# Patient Record
Sex: Male | Born: 1940 | Hispanic: No | Smoking: Former smoker
Health system: Southern US, Community
[De-identification: ages and names within clinical notes are randomized; demographics above are authoritative.]

## PROBLEM LIST (undated history)

## (undated) DIAGNOSIS — I2510 Atherosclerotic heart disease of native coronary artery without angina pectoris: Secondary | ICD-10-CM

## (undated) DIAGNOSIS — N289 Disorder of kidney and ureter, unspecified: Secondary | ICD-10-CM

## (undated) DIAGNOSIS — E119 Type 2 diabetes mellitus without complications: Secondary | ICD-10-CM

## (undated) DIAGNOSIS — I255 Ischemic cardiomyopathy: Secondary | ICD-10-CM

## (undated) DIAGNOSIS — I10 Essential (primary) hypertension: Secondary | ICD-10-CM

## (undated) DIAGNOSIS — Z77098 Contact with and (suspected) exposure to other hazardous, chiefly nonmedicinal, chemicals: Secondary | ICD-10-CM

## (undated) DIAGNOSIS — Z95810 Presence of automatic (implantable) cardiac defibrillator: Secondary | ICD-10-CM

## (undated) DIAGNOSIS — I4729 Other ventricular tachycardia: Secondary | ICD-10-CM

## (undated) DIAGNOSIS — J986 Disorders of diaphragm: Secondary | ICD-10-CM

## (undated) DIAGNOSIS — I219 Acute myocardial infarction, unspecified: Secondary | ICD-10-CM

## (undated) HISTORY — PX: KIDNEY STONE SURGERY: SHX686

## (undated) HISTORY — PX: CORONARY ARTERY BYPASS GRAFT: SHX141

## (undated) HISTORY — PX: BIOPSY PROSTATE: PRO28

---

## 2014-12-22 ENCOUNTER — Emergency Department: Payer: Non-veteran care

## 2014-12-22 ENCOUNTER — Emergency Department
Admission: EM | Admit: 2014-12-22 | Discharge: 2014-12-23 | Disposition: A | Discharge status: Other Acute Inpatient Hospital | Payer: Non-veteran care | Attending: Emergency Medicine | Admitting: Emergency Medicine

## 2014-12-22 ENCOUNTER — Encounter: Payer: Self-pay | Admitting: Urgent Care

## 2014-12-22 DIAGNOSIS — E871 Hypo-osmolality and hyponatremia: Secondary | ICD-10-CM | POA: Diagnosis not present

## 2014-12-22 DIAGNOSIS — N179 Acute kidney failure, unspecified: Secondary | ICD-10-CM | POA: Diagnosis not present

## 2014-12-22 DIAGNOSIS — I252 Old myocardial infarction: Secondary | ICD-10-CM | POA: Diagnosis not present

## 2014-12-22 DIAGNOSIS — I10 Essential (primary) hypertension: Secondary | ICD-10-CM | POA: Insufficient documentation

## 2014-12-22 DIAGNOSIS — R102 Pelvic and perineal pain: Secondary | ICD-10-CM | POA: Diagnosis not present

## 2014-12-22 DIAGNOSIS — A419 Sepsis, unspecified organism: Secondary | ICD-10-CM | POA: Insufficient documentation

## 2014-12-22 DIAGNOSIS — N390 Urinary tract infection, site not specified: Secondary | ICD-10-CM | POA: Insufficient documentation

## 2014-12-22 DIAGNOSIS — Z794 Long term (current) use of insulin: Secondary | ICD-10-CM | POA: Insufficient documentation

## 2014-12-22 DIAGNOSIS — M549 Dorsalgia, unspecified: Secondary | ICD-10-CM | POA: Diagnosis present

## 2014-12-22 DIAGNOSIS — E119 Type 2 diabetes mellitus without complications: Secondary | ICD-10-CM | POA: Diagnosis not present

## 2014-12-22 DIAGNOSIS — Z87891 Personal history of nicotine dependence: Secondary | ICD-10-CM | POA: Insufficient documentation

## 2014-12-22 HISTORY — DX: Acute myocardial infarction, unspecified: I21.9

## 2014-12-22 HISTORY — DX: Type 2 diabetes mellitus without complications: E11.9

## 2014-12-22 HISTORY — DX: Essential (primary) hypertension: I10

## 2014-12-22 LAB — URINALYSIS COMPLETE WITH MICROSCOPIC (ARMC ONLY)
Bilirubin Urine: NEGATIVE
Ketones, ur: NEGATIVE mg/dL
Nitrite: NEGATIVE
PROTEIN: 100 mg/dL — AB
SPECIFIC GRAVITY, URINE: 1.009 (ref 1.005–1.030)
SQUAMOUS EPITHELIAL / LPF: NONE SEEN
pH: 5 (ref 5.0–8.0)

## 2014-12-22 LAB — PROTIME-INR
INR: 1.2
PROTHROMBIN TIME: 15.4 s — AB (ref 11.4–15.0)

## 2014-12-22 LAB — BLOOD GAS, ARTERIAL
ALLENS TEST (PASS/FAIL): POSITIVE — AB
Acid-base deficit: 4.4 mmol/L — ABNORMAL HIGH (ref 0.0–2.0)
BICARBONATE: 20.2 meq/L — AB (ref 21.0–28.0)
FIO2: 0.21 %
O2 Saturation: 97.4 %
Patient temperature: 37
pCO2 arterial: 35 mmHg (ref 32.0–48.0)
pH, Arterial: 7.37 (ref 7.350–7.450)
pO2, Arterial: 98 mmHg (ref 83.0–108.0)

## 2014-12-22 LAB — CBC WITH DIFFERENTIAL/PLATELET
BASOS ABS: 0.1 10*3/uL (ref 0–0.1)
Basophils Relative: 1 %
Eosinophils Absolute: 0 10*3/uL (ref 0–0.7)
Eosinophils Relative: 0 %
HCT: 38.9 % — ABNORMAL LOW (ref 40.0–52.0)
HEMOGLOBIN: 12.9 g/dL — AB (ref 13.0–18.0)
Lymphocytes Relative: 13 %
Lymphs Abs: 1.9 10*3/uL (ref 1.0–3.6)
MCH: 28.6 pg (ref 26.0–34.0)
MCHC: 33.3 g/dL (ref 32.0–36.0)
MCV: 86 fL (ref 80.0–100.0)
Monocytes Absolute: 1.6 10*3/uL — ABNORMAL HIGH (ref 0.2–1.0)
Monocytes Relative: 11 %
NEUTROS ABS: 11.3 10*3/uL — AB (ref 1.4–6.5)
Neutrophils Relative %: 75 %
PLATELETS: 699 10*3/uL — AB (ref 150–440)
RBC: 4.52 MIL/uL (ref 4.40–5.90)
RDW: 14.4 % (ref 11.5–14.5)
WBC: 14.9 10*3/uL — AB (ref 3.8–10.6)

## 2014-12-22 LAB — COMPREHENSIVE METABOLIC PANEL
ALT: 16 U/L — ABNORMAL LOW (ref 17–63)
ANION GAP: 14 (ref 5–15)
AST: 19 U/L (ref 15–41)
Albumin: 3.2 g/dL — ABNORMAL LOW (ref 3.5–5.0)
Alkaline Phosphatase: 121 U/L (ref 38–126)
BILIRUBIN TOTAL: 0.8 mg/dL (ref 0.3–1.2)
BUN: 81 mg/dL — ABNORMAL HIGH (ref 6–20)
CALCIUM: 9.9 mg/dL (ref 8.9–10.3)
CO2: 22 mmol/L (ref 22–32)
Chloride: 93 mmol/L — ABNORMAL LOW (ref 101–111)
Creatinine, Ser: 2.24 mg/dL — ABNORMAL HIGH (ref 0.61–1.24)
GFR calc Af Amer: 32 mL/min — ABNORMAL LOW (ref >60)
GFR calc non Af Amer: 28 mL/min — ABNORMAL LOW (ref >60)
Glucose, Bld: 307 mg/dL — ABNORMAL HIGH (ref 65–99)
Potassium: 4.5 mmol/L (ref 3.5–5.1)
Sodium: 129 mmol/L — ABNORMAL LOW (ref 135–145)
TOTAL PROTEIN: 9 g/dL — AB (ref 6.5–8.1)

## 2014-12-22 LAB — LACTIC ACID, PLASMA: LACTIC ACID, VENOUS: 2.8 mmol/L — AB (ref 0.5–2.0)

## 2014-12-22 IMAGING — CT CT ABD-PELV W/O CM
1 of 2 series · 14 of 32 positions shown, 18 images · non-contrast
Comparison: None.

CLINICAL DATA: Subacute onset of lower back pain for 2 weeks.
Penile swelling and dysuria. Leukocytosis. Initial encounter.

EXAM:
CT ABDOMEN AND PELVIS WITHOUT CONTRAST
TECHNIQUE: Multidetector CT imaging of the abdomen and pelvis was performed
following the standard protocol without IV contrast.

[Series 2: routine abd pel without · axial · non-contrast · 0.71mm/px · z∈[-482,+23]mm · 14 of 115 slices shown, 18 images]
[im 9/115  soft-tissue]
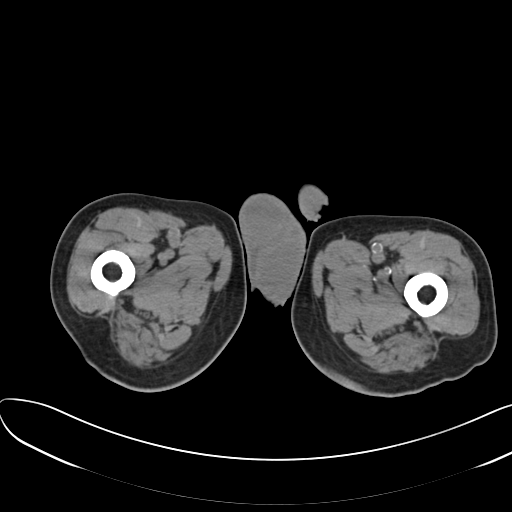
[im 9/115  bone]
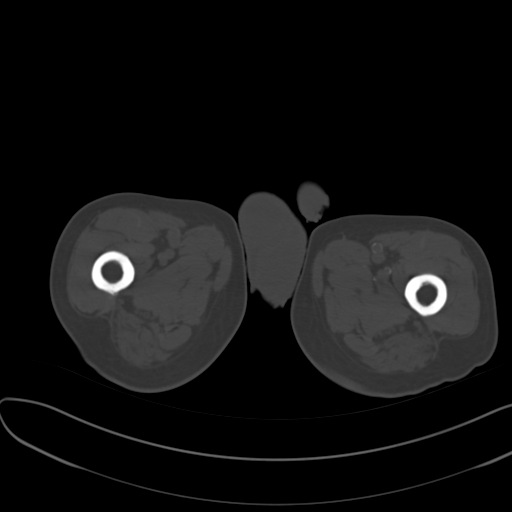
[im 18/115  soft-tissue]
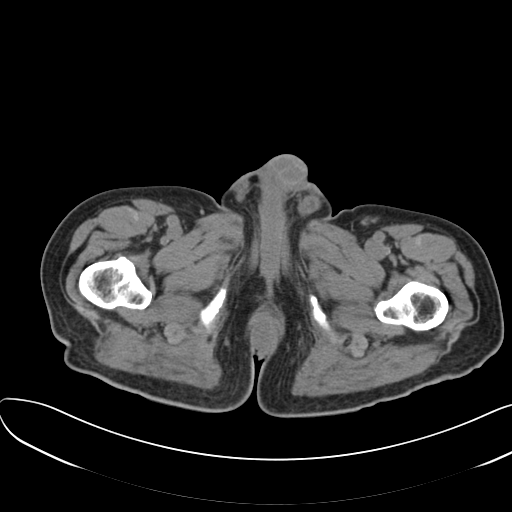
[im 27/115  soft-tissue]
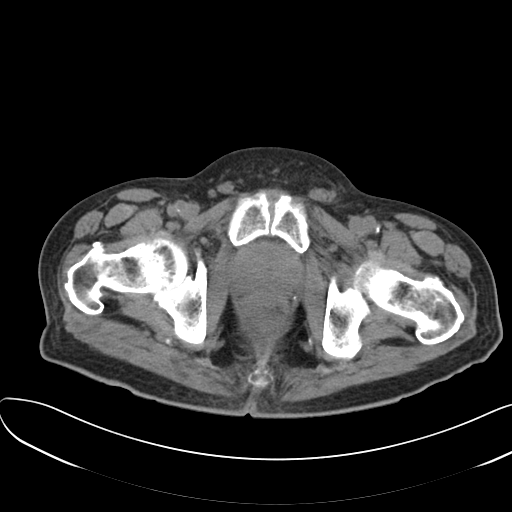
[im 36/115  soft-tissue]
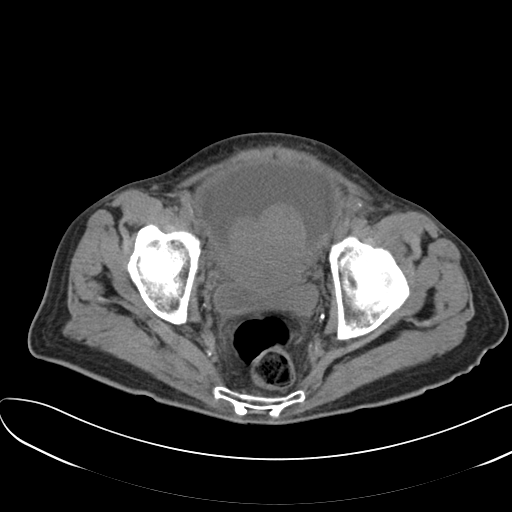
[im 44/115  soft-tissue]
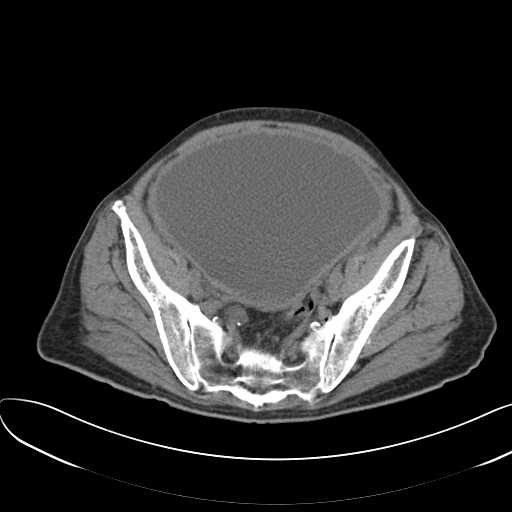
[im 53/115  soft-tissue]
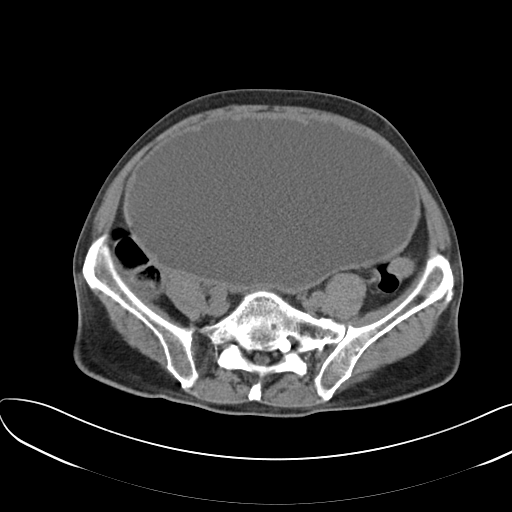
[im 62/115  soft-tissue]
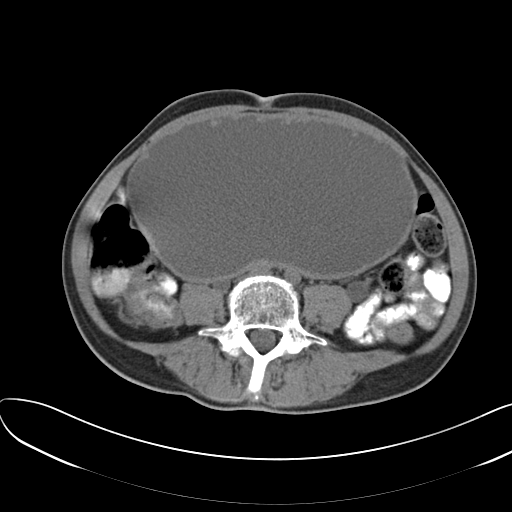
[im 71/115  soft-tissue]
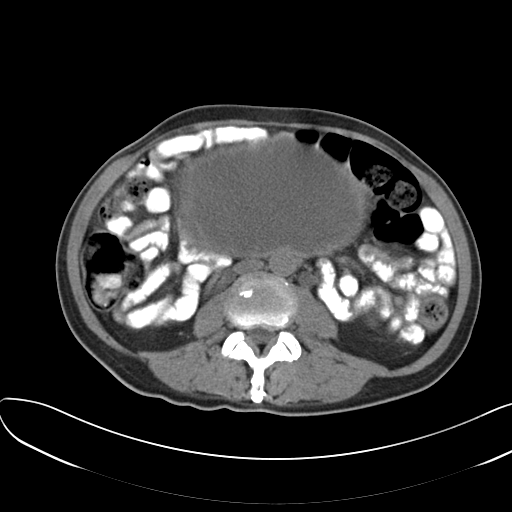
[im 79/115  soft-tissue]
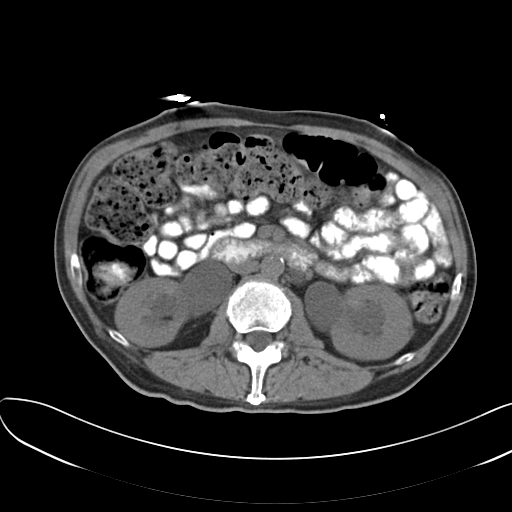
[im 79/115  bone]
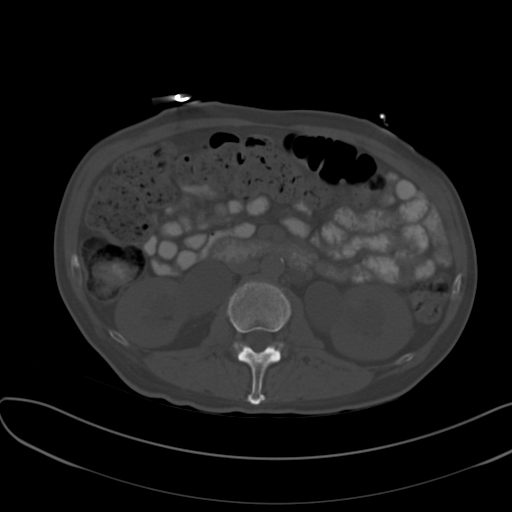
[im 88/115  soft-tissue]
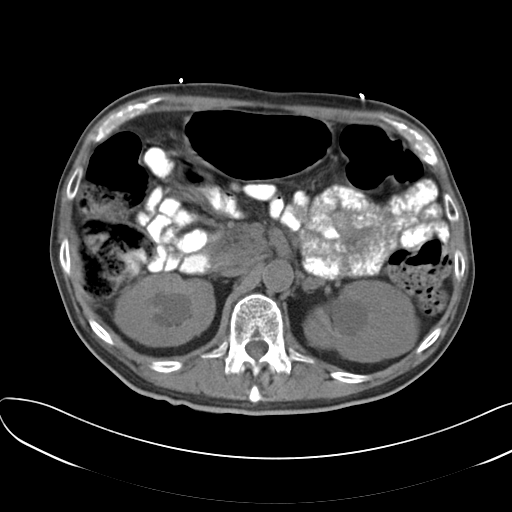
[im 97/115  soft-tissue]
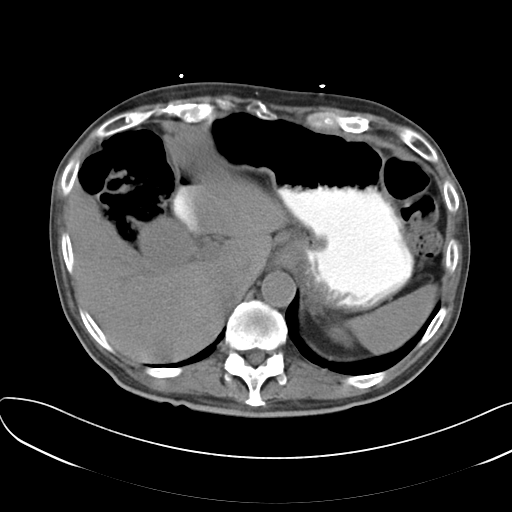
[im 97/115  lung]
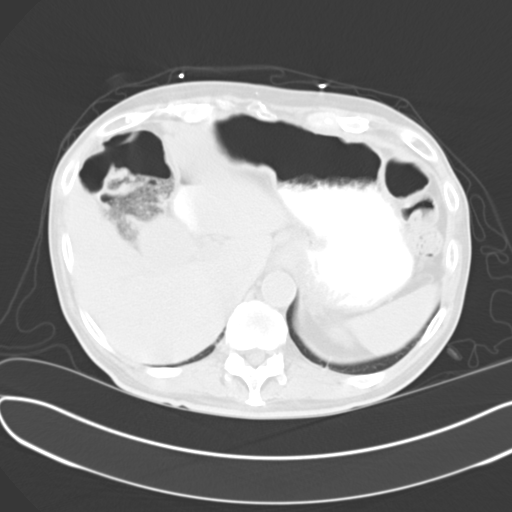
[im 101/115  lung]
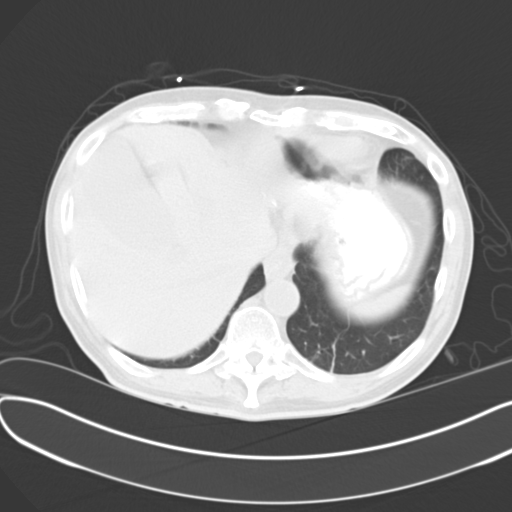
[im 106/115  soft-tissue]
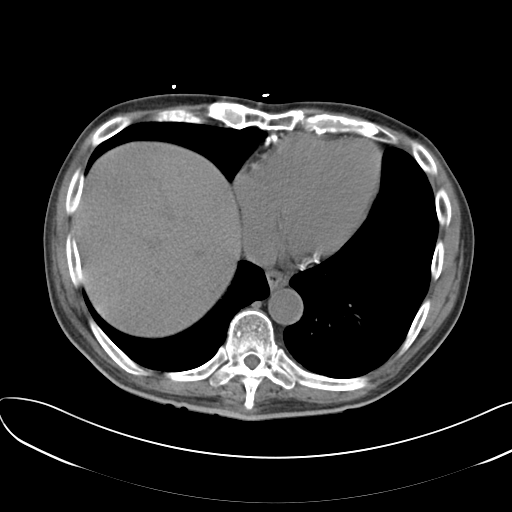
[im 106/115  lung]
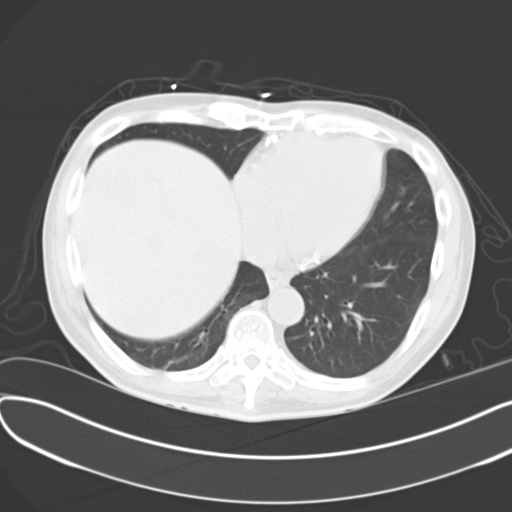
[im 110/115  lung]
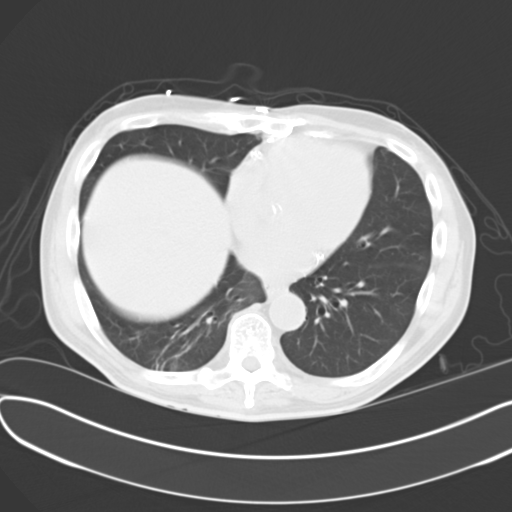

[14 of 32 positions shown; findings below may reference images not displayed]

FINDINGS: Mild bibasilar linear atelectasis or scarring is noted. Diffuse
coronary artery calcifications are seen. The patient is status post
median sternotomy.

The liver and spleen are unremarkable in appearance. The gallbladder
is distended but otherwise unremarkable. The common bile duct is not
well assessed but likely grossly normal in caliber. The pancreas and
adrenal glands are unremarkable.

Severe bilateral chronic hydronephrosis is noted. A 7 mm
nonobstructing stone is noted at the lower pole of the left kidney,
and scattered small nonobstructing bilateral renal stones are seen.
The kidneys are otherwise grossly unremarkable. No perinephric
stranding is appreciated.

No free fluid is identified. The small bowel is unremarkable in
appearance. The stomach is within normal limits. No acute vascular
abnormalities are seen. Mild scattered calcification is noted along
the abdominal aorta and its branches.

The appendix is grossly unremarkable in appearance, without evidence
of appendicitis. The colon is partially filled with stool, and is
unremarkable in appearance.

The bladder is markedly distended, measuring 22.3 cm in length.
There is underlying mild wall thickening, reflecting chronic
neurogenic bladder. There is abnormal enlargement of the prostate,
measuring approximately 10.7 cm in craniocaudal dimension, with
nodular extension of a 6.3 cm mass into the base of the bladder.
Underlying malignancy is a concern. No inguinal lymphadenopathy is
seen.

An apparent scrotal pearl is noted at the left side of the scrotum.

No acute osseous abnormalities are identified.
IMPRESSION: 1. Marked distention of the bladder, measuring 22.3 cm in length.
Underlying mild wall thickening reflects chronic neurogenic bladder.
2. Abnormal enlargement of the prostate, measuring 10.7 cm in
craniocaudal dimension, with nodular extension of a 6.3 cm mass into
the base of the bladder. Underlying malignancy is a concern. This is
likely causing some degree of bladder outlet obstruction.
3. Severe chronic bilateral hydronephrosis noted. No obstructing
ureteral stone seen. Multiple nonobstructing bilateral renal stones
noted, measuring up to 7 mm in size.
4. Diffuse coronary artery calcifications seen.
5. Mild bibasilar linear atelectasis or scarring noted.
6. Mild scattered calcification along the abdominal aorta and its
branches.
7. Apparent scrotal pearl at the left side of the scrotum.

These results were called by telephone at the time of interpretation
on [DATE] at [DATE] to Dr. PHILOGENE, who verbally
acknowledged these results.

## 2014-12-22 MED ORDER — CEFTRIAXONE SODIUM IN DEXTROSE 40 MG/ML IV SOLN
2.0000 g | INTRAVENOUS | Status: AC
  Administered 2014-12-22: 2 g via INTRAVENOUS
  Filled 2014-12-22: qty 50

## 2014-12-22 MED ORDER — IOHEXOL 240 MG/ML SOLN
25.0000 mL | INTRAMUSCULAR | Status: AC
  Administered 2014-12-22 (×2): 25 mL via ORAL

## 2014-12-22 MED ORDER — SODIUM CHLORIDE 0.9 % IV BOLUS (SEPSIS)
800.0000 mL | Freq: Once | INTRAVENOUS | Status: AC
  Administered 2014-12-22: 800 mL via INTRAVENOUS

## 2014-12-22 MED ORDER — SODIUM CHLORIDE 0.9 % IV SOLN
Freq: Once | INTRAVENOUS | Status: AC
  Administered 2014-12-22: 22:00:00 via INTRAVENOUS

## 2014-12-22 MED ORDER — SODIUM CHLORIDE 0.9 % IV BOLUS (SEPSIS)
1000.0000 mL | INTRAVENOUS | Status: AC
  Administered 2014-12-22: 1000 mL via INTRAVENOUS

## 2014-12-22 NOTE — ED Provider Notes (Signed)
[facility] Emergency Department Provider Note  ____________________________________________  Time seen: Approximately 8:34 PM  I have reviewed the triage vital signs and the nursing notes.   HISTORY  Chief Complaint Back Pain; Hypotension; Dysuria; and Groin Swelling  History is limited as the patient is a vague historian when it comes to describing his symptoms.   HPI Glenn [name] is a 74 y.o. male with a history of insulin-dependent diabetes, hypertension, and CAD/MI status post CABG who presents with 2 weeks of slowly worsening lower back and penile pain.He states that the onset has been gradual but persistent for 2 weeks, and got much worse today.  He states that he has swelling in the penis and foreskin and that today "white stuff" is coming out of it.  He states that it hurts when he urinates.  It is also tender in his scrotum and "all the way to the anus ".  He denies tenesmus.  He denies chest pain, shortness of breath, abdominal pain, and vomiting, though he does have persistent nausea.  He has also complaining of lower back pain, generalized, moderate, for the last 2 weeks.   Past Medical History  Diagnosis Date  . Diabetes mellitus without complication   . Hypertension   . MI (myocardial infarction)     There are no active problems to display for this patient.   Past Surgical History  Procedure Laterality Date  . Coronary artery bypass graft      x 4    Current Outpatient Rx  Name  Route  Sig  Dispense  Refill  . insulin NPH-regular Human (NOVOLIN 70/30) (70-30) 100 UNIT/ML injection   Subcutaneous   Inject 25 Units into the skin daily.           Allergies Review of patient's allergies indicates no known allergies.  History reviewed. No pertinent family history.  Social History History  Substance Use Topics  . Smoking status: Former Games developer  . Smokeless tobacco: Not on file  . Alcohol Use: Yes    Review of  Systems Constitutional: No fever/chills Eyes: No visual changes. ENT: No sore throat. Cardiovascular: Denies chest pain. Respiratory: Denies shortness of breath. Gastrointestinal: No abdominal pain.  nausea, no vomiting.  No diarrhea.  No constipation. Genitourinary: Dysuria and white discharge with swelling to the penis and perineum Musculoskeletal: Negative for back pain. Skin: Negative for rash. Neurological: Negative for headaches, focal weakness or numbness.  10-point ROS otherwise negative.  ____________________________________________   PHYSICAL EXAM:  VITAL SIGNS: ED Triage Vitals  Enc Vitals Group     BP 12/22/14 2022 86/53 mmHg     Pulse Rate 12/22/14 2022 68     Resp 12/22/14 2022 14     Temp 12/22/14 2022 97.4 F (36.3 C)     Temp Source 12/22/14 2022 Oral     SpO2 12/22/14 2022 100 %     Weight 12/22/14 2022 129 lb (58.514 kg)     Height 12/22/14 2022 6' (1.829 m)     Head Cir --      Peak Flow --      Pain Score 12/22/14 2024 7     Pain Loc --      Pain Edu? --      Excl. in GC? --     Constitutional: Alert and oriented.  Has the appearance of chronic illness.  Nontoxic appearance.  Cachectic. Eyes: Conjunctivae are normal. PERRL. EOMI. Head: Atraumatic. Nose: No congestion/rhinnorhea. Mouth/Throat: Mucous membranes are moist.  Oropharynx non-erythematous. Neck: No stridor.   Cardiovascular: Normal rate, regular rhythm. Grossly normal heart sounds.  Good peripheral circulation.  Old sternotomy scar Respiratory: Normal respiratory effort.  No retractions. Lungs CTAB. Gastrointestinal: Soft and nontender. No distention. No abdominal bruits. No CVA tenderness. Genitourinary: Uncircumcised male with mild erythema to the glans of his penis when the foreskin is retracted, but no obvious infection; most consistent with balanitis rather than balanoposthitis.  Tenderness to palpation of the scrotum and perineum but without crepitus, breaks in the skin, induration,  or fluctuance. Musculoskeletal: No lower extremity tenderness nor edema.  No joint effusions. Neurologic:  Normal speech and language. No gross focal neurologic deficits are appreciated.  Skin:  Skin is warm, dry and intact. No rash noted. Psychiatric: Mood and affect are normal. Speech and behavior are normal.  ____________________________________________   LABS (all labs ordered are listed, but only abnormal results are displayed)  Labs Reviewed  LACTIC ACID, PLASMA - Abnormal; Notable for the following:    Lactic Acid, Venous 2.8 (*)    All other components within normal limits  COMPREHENSIVE METABOLIC PANEL - Abnormal; Notable for the following:    Sodium 129 (*)    Chloride 93 (*)    Glucose, Bld 307 (*)    BUN 81 (*)    Creatinine, Ser 2.24 (*)    Total Protein 9.0 (*)    Albumin 3.2 (*)    ALT 16 (*)    GFR calc non Af Amer 28 (*)    GFR calc Af Amer 32 (*)    All other components within normal limits  CBC WITH DIFFERENTIAL/PLATELET - Abnormal; Notable for the following:    WBC 14.9 (*)    Hemoglobin 12.9 (*)    HCT 38.9 (*)    Platelets 699 (*)    Neutro Abs 11.3 (*)    Monocytes Absolute 1.6 (*)    All other components within normal limits  PROTIME-INR - Abnormal; Notable for the following:    Prothrombin Time 15.4 (*)    All other components within normal limits  URINALYSIS COMPLETEWITH MICROSCOPIC ([facility] ONLY) - Abnormal; Notable for the following:    Color, Urine YELLOW (*)    APPearance TURBID (*)    Glucose, UA >500 (*)    Hgb urine dipstick 1+ (*)    Protein, ur 100 (*)    Leukocytes, UA 3+ (*)    Bacteria, UA MANY (*)    All other components within normal limits  BLOOD GAS, ARTERIAL - Abnormal; Notable for the following:    Bicarbonate 20.2 (*)    Acid-base deficit 4.4 (*)    Allens test (pass/fail) POSITIVE (*)    All other components within normal limits  CULTURE, BLOOD (ROUTINE X 2)  CULTURE, BLOOD (ROUTINE X 2)  URINE CULTURE  LACTIC ACID,  PLASMA   ____________________________________________  EKG  Not indicated  ____________________________________________  RADIOLOGY  CT abd/pelvis with PO contrast only is pending (as of 22:15)  ____________________________________________   PROCEDURES  Procedure(s) performed: None  Critical Care performed: Yes  CRITICAL CARE Performed by: Loleta Rose   Total critical care time: 45 minutes  Critical care time was exclusive of separately billable procedures and treating other patients.  Critical care was necessary to treat or prevent imminent or life-threatening deterioration.  Critical care was time spent personally by me on the following activities: development of treatment plan with patient and/or surrogate as well as nursing, discussions with consultants, evaluation of patient's response to treatment,  examination of patient, obtaining history from patient or surrogate, ordering and performing treatments and interventions, ordering and review of laboratory studies, ordering and review of radiographic studies, pulse oximetry and re-evaluation of patient's condition.  ____________________________________________   INITIAL IMPRESSION / ASSESSMENT AND PLAN / ED COURSE  Pertinent labs & imaging results that were available during my care of the patient were reviewed by me and considered in my medical decision making (see chart for details).  The patient was hypotensive in triage but after getting to an exam room his blood pressure improved to 117/77.  He does not have tachycardia, is afebrile, normal respiratory rate, and no altered mental status.  Currently he does not meet sepsis criteria though he has the appearance of acute on chronic illness.  I am most concerned with ruling out for Fournier's gangrene, though if it is, it is early as he has no crepitus or fluctuance.  His two weeks of symptoms suggest a more insidious course as well.  I expressed to him how important  it is that we get a urine specimen as soon as possible and that we will perform an I have catheterization if needed to obtain the urine.  We will proceed with IV fluids and lab work including a lactate.  I will have a low threshold for empiric antibiotics, but he has no complaints other than dysuria and the perineal tenderness, so I will hold until we get a urine sample.  ----------------------------------------- 8:58 PM on 12/22/2014 -----------------------------------------  The patient's urine sample is extremely purulent.  I will proceed with empiric antibiotics for presumed urinary tract infection.  A urine culture has been ordered.  ----------------------------------------- 10:09 PM on 12/22/2014 -----------------------------------------  BP 160/89 mmHg  Pulse 79  Temp(Src) 97.7 F (36.5 C) (Oral)  Resp 13  Ht 6' (1.829 m)  Wt 129 lb (58.514 kg)  BMI 17.49 kg/m2  SpO2 100%  The patient's blood pressure is improved, but his lactate is 2.8 and he has acute renal failure with a BUN of 81 and a creatinine of 2.24.  We do not have a baseline creatinine in the system but a BUN of 81 and is certainly concerning.  He has a leukocytosis of approximately 15.  He meets sepsis criteria.  I am giving him 1.8 L of normal saline (just over 30 mL/kg in order to meet sepsis recommendations) and continuing the ceftriaxone 2 g IV for urosepsis.  I am proceeding with the CT abdomen and pelvis given his complaints of lower back pain and perineal pain, but we must proceed without IV contrast given his GFR.  I have filled out paperwork for a [facility] transfer with everything except for the CT scan results which are of course pending.  Either way, the patient will need admission for sepsis in the setting of a urinary tract infection and I am beginning the process now.  I am transferring care in the emergency department to Dr. Derrill KayGoodman.  ____________________________________________  FINAL CLINICAL IMPRESSION(S) /  ED DIAGNOSES  Final diagnoses:  Sepsis, due to unspecified organism  Complicated UTI (urinary tract infection)  Acute renal failure, unspecified acute renal failure type  Perineal pain in male  Hyponatremia      NEW MEDICATIONS STARTED DURING THIS VISIT:  New Prescriptions   No medications on file       Loleta Roseory Apryle Stowell, MD 12/23/14 0021

## 2014-12-22 NOTE — ED Notes (Signed)
MD at bedside. 

## 2014-12-22 NOTE — ED Notes (Signed)
Pt called out for nurse,  He reports that his face is feeling hot.  He says that he feels that his face started feeling hot when the second liter of IV fluid was started.   He denies other complaints.  He is afebrile.  While at bedside  This RN decreased rate of infusion and after just a minute he reports that he is feeling better and his face is no longer hot like it was. Also checked infusion site and there is no redness, tenderness or edema noted at site.

## 2014-12-22 NOTE — ED Notes (Signed)
Patient transported to CT 

## 2014-12-22 NOTE — ED Notes (Signed)
Patient presents with c/o lower back pain x 2 weeks. (+) penile swelling reported. (+) dysuria - patient reporting that he is "peeing thick white stuff."

## 2014-12-23 LAB — LACTIC ACID, PLASMA: Lactic Acid, Venous: 1.4 mmol/L (ref 0.5–2.0)

## 2014-12-23 NOTE — ED Notes (Signed)
MD at bedside. 

## 2014-12-25 LAB — URINE CULTURE: SPECIAL REQUESTS: NORMAL

## 2014-12-27 LAB — CULTURE, BLOOD (ROUTINE X 2)
CULTURE: NO GROWTH
Culture: NO GROWTH

## 2016-09-04 ENCOUNTER — Emergency Department: Payer: Non-veteran care

## 2016-09-04 ENCOUNTER — Emergency Department
Admission: EM | Admit: 2016-09-04 | Discharge: 2016-09-04 | Disposition: A | Discharge status: Home / Self Care | Payer: Non-veteran care | Attending: Emergency Medicine | Admitting: Emergency Medicine

## 2016-09-04 ENCOUNTER — Encounter: Payer: Self-pay | Admitting: Emergency Medicine

## 2016-09-04 DIAGNOSIS — R600 Localized edema: Secondary | ICD-10-CM | POA: Diagnosis not present

## 2016-09-04 DIAGNOSIS — N390 Urinary tract infection, site not specified: Secondary | ICD-10-CM | POA: Diagnosis not present

## 2016-09-04 DIAGNOSIS — R609 Edema, unspecified: Secondary | ICD-10-CM

## 2016-09-04 DIAGNOSIS — Z951 Presence of aortocoronary bypass graft: Secondary | ICD-10-CM | POA: Diagnosis not present

## 2016-09-04 DIAGNOSIS — E119 Type 2 diabetes mellitus without complications: Secondary | ICD-10-CM | POA: Insufficient documentation

## 2016-09-04 DIAGNOSIS — Z87891 Personal history of nicotine dependence: Secondary | ICD-10-CM | POA: Insufficient documentation

## 2016-09-04 DIAGNOSIS — I10 Essential (primary) hypertension: Secondary | ICD-10-CM | POA: Diagnosis not present

## 2016-09-04 DIAGNOSIS — Z794 Long term (current) use of insulin: Secondary | ICD-10-CM | POA: Insufficient documentation

## 2016-09-04 DIAGNOSIS — R0602 Shortness of breath: Secondary | ICD-10-CM | POA: Diagnosis not present

## 2016-09-04 DIAGNOSIS — R369 Urethral discharge, unspecified: Secondary | ICD-10-CM | POA: Diagnosis present

## 2016-09-04 DIAGNOSIS — R05 Cough: Secondary | ICD-10-CM | POA: Diagnosis not present

## 2016-09-04 HISTORY — DX: Disorder of kidney and ureter, unspecified: N28.9

## 2016-09-04 HISTORY — DX: Contact with and (suspected) exposure to other hazardous, chiefly nonmedicinal, chemicals: Z77.098

## 2016-09-04 LAB — URINALYSIS, COMPLETE (UACMP) WITH MICROSCOPIC
BACTERIA UA: NONE SEEN
BILIRUBIN URINE: NEGATIVE
KETONES UR: 5 mg/dL — AB
Nitrite: NEGATIVE
Protein, ur: 100 mg/dL — AB
Specific Gravity, Urine: 1.018 (ref 1.005–1.030)
pH: 6 (ref 5.0–8.0)

## 2016-09-04 LAB — BASIC METABOLIC PANEL
Anion gap: 9 (ref 5–15)
BUN: 19 mg/dL (ref 6–20)
CHLORIDE: 96 mmol/L — AB (ref 101–111)
CO2: 25 mmol/L (ref 22–32)
CREATININE: 1.17 mg/dL (ref 0.61–1.24)
Calcium: 8.9 mg/dL (ref 8.9–10.3)
GFR calc non Af Amer: >60 mL/min (ref >60)
Glucose, Bld: 265 mg/dL — ABNORMAL HIGH (ref 65–99)
Potassium: 4.1 mmol/L (ref 3.5–5.1)
Sodium: 130 mmol/L — ABNORMAL LOW (ref 135–145)

## 2016-09-04 LAB — CBC
HCT: 38.2 % — ABNORMAL LOW (ref 40.0–52.0)
Hemoglobin: 12.4 g/dL — ABNORMAL LOW (ref 13.0–18.0)
MCH: 25.5 pg — ABNORMAL LOW (ref 26.0–34.0)
MCHC: 32.5 g/dL (ref 32.0–36.0)
MCV: 78.4 fL — AB (ref 80.0–100.0)
Platelets: 210 10*3/uL (ref 150–440)
RBC: 4.87 MIL/uL (ref 4.40–5.90)
RDW: 22.2 % — AB (ref 11.5–14.5)
WBC: 7.3 10*3/uL (ref 3.8–10.6)

## 2016-09-04 LAB — TROPONIN I: Troponin I: <0.03 ng/mL (ref <0.03)

## 2016-09-04 LAB — CK: Total CK: 81 U/L (ref 49–397)

## 2016-09-04 LAB — BRAIN NATRIURETIC PEPTIDE: B Natriuretic Peptide: 2561 pg/mL — ABNORMAL HIGH (ref 0.0–100.0)

## 2016-09-04 IMAGING — CR DG CHEST 2V
2 series · 2 of 2 positions shown · non-contrast
Comparison: [DATE] CXR report

CLINICAL DATA: Dyspnea

EXAM:
CHEST  2 VIEW

[chest pa]
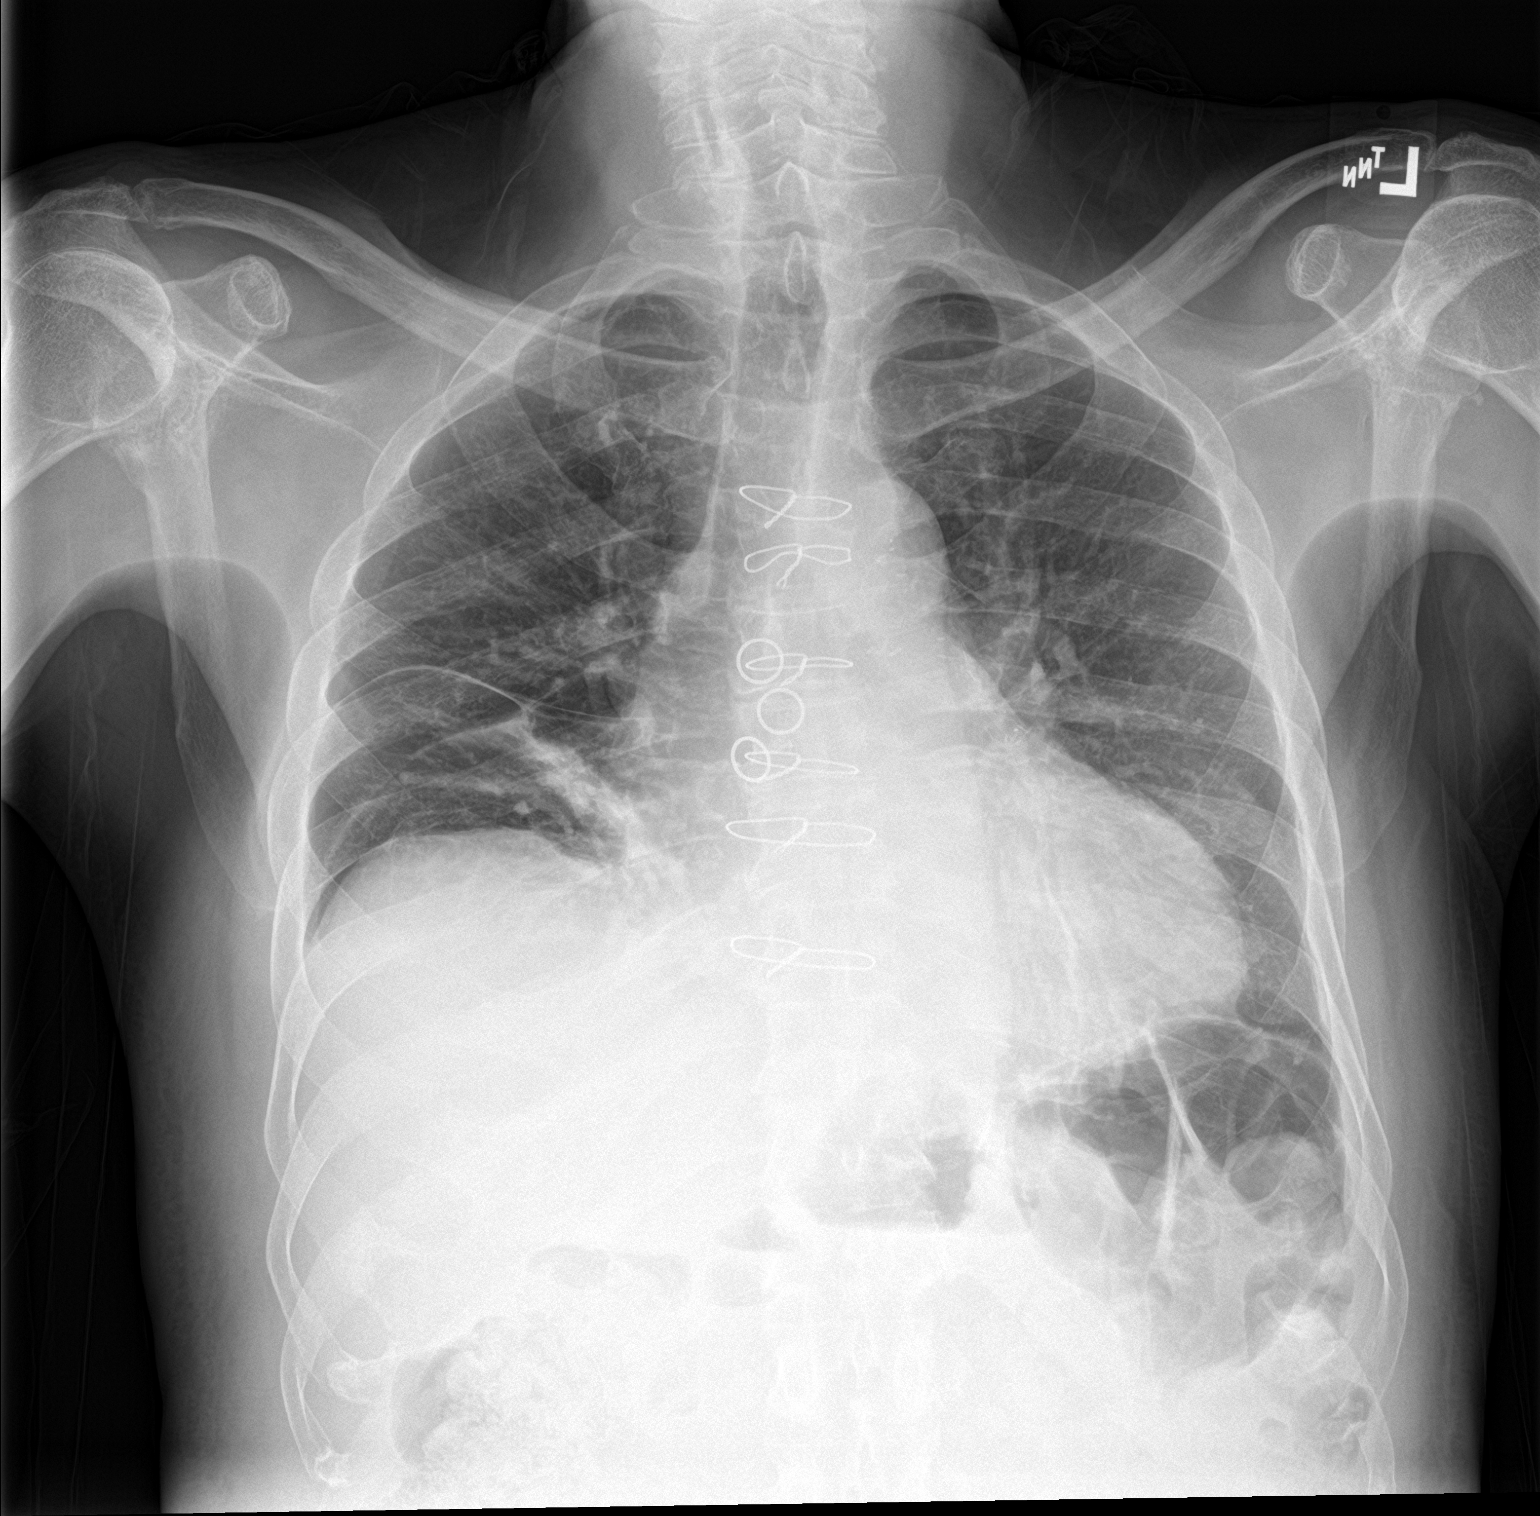

[chest lat]
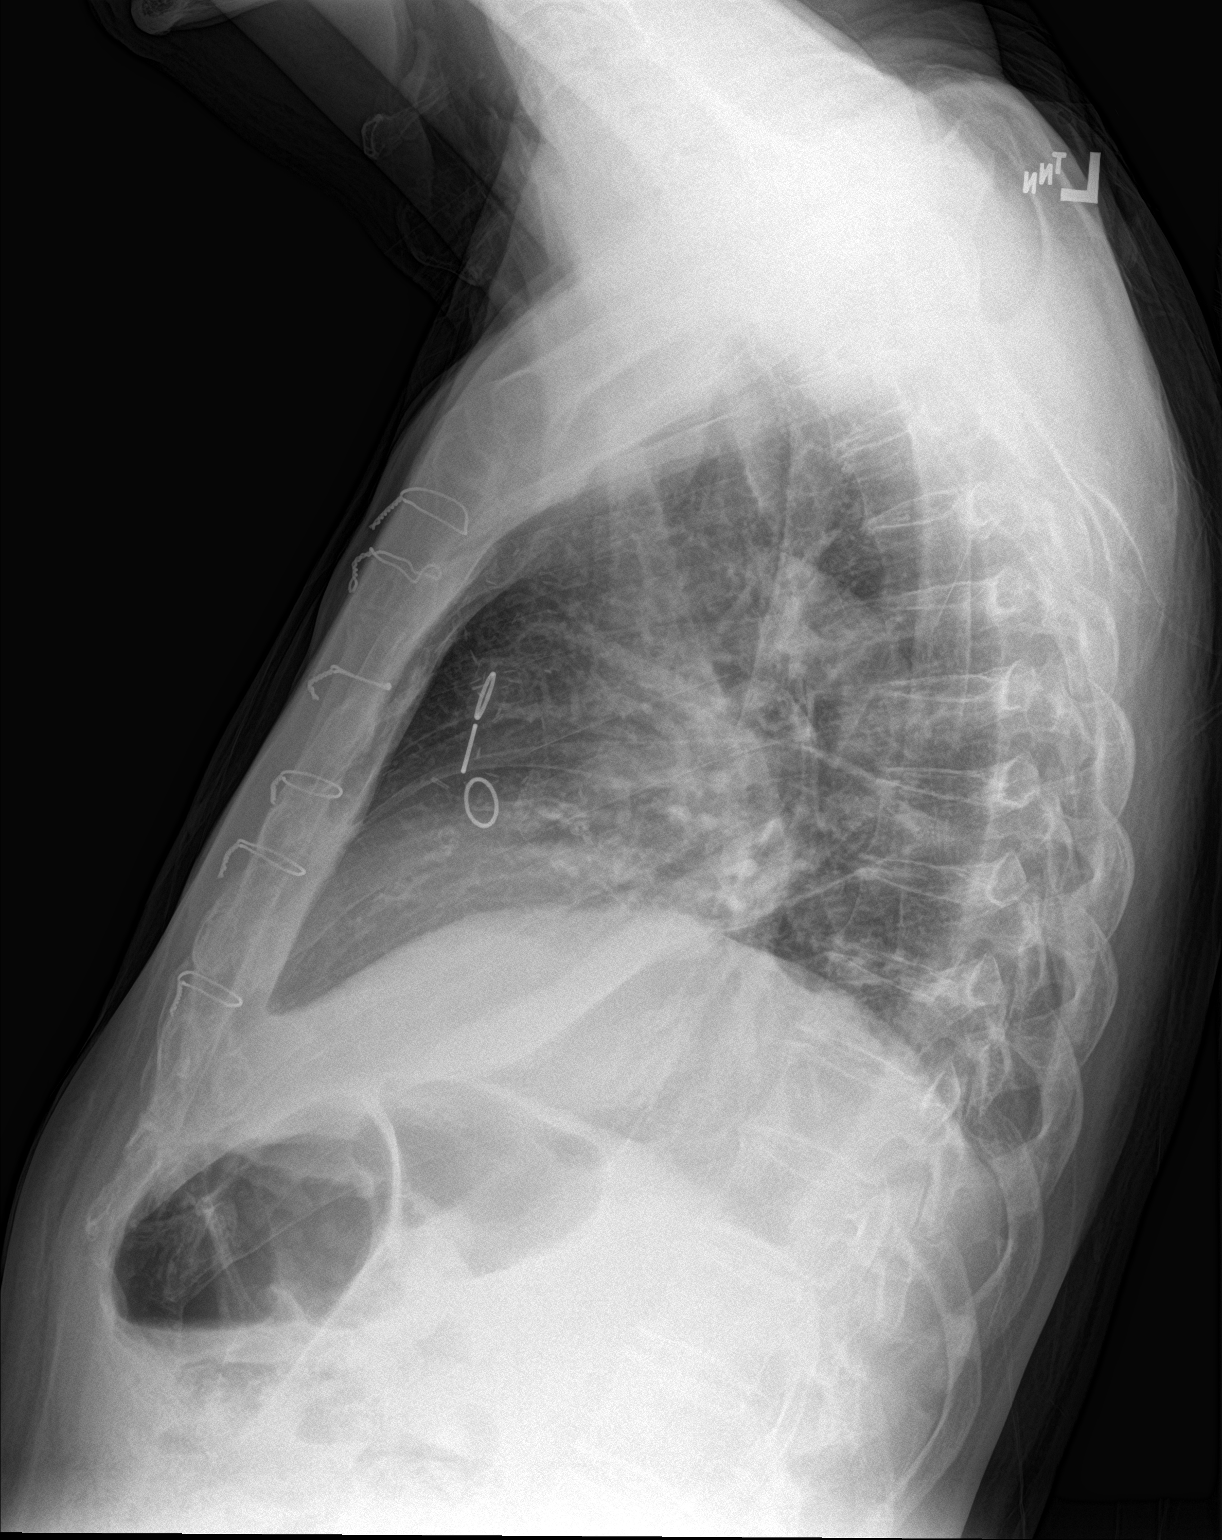

[2 of 2 positions shown; findings below may reference images not displayed]

FINDINGS: Scarring and/or atelectasis is noted at the lung bases. Patient is
status post CABG. Cardiac silhouette is mildly enlarged. There is
slight elevation of the right hemidiaphragm. No pulmonary
consolidation, effusion or pneumothorax.
IMPRESSION: Bibasilar scarring and/or atelectasis. Cardiomegaly with post CABG
change. Slight elevation of the right hemidiaphragm.

## 2016-09-04 MED ORDER — FUROSEMIDE 20 MG PO TABS: 20.0000 mg | ORAL_TABLET | Freq: Every day | ORAL | 0 refills | Status: DC

## 2016-09-04 MED ORDER — CEPHALEXIN 500 MG PO CAPS
500.0000 mg | ORAL_CAPSULE | Freq: Once | ORAL | Status: AC
  Administered 2016-09-04: 500 mg via ORAL
  Filled 2016-09-04: qty 1

## 2016-09-04 MED ORDER — FUROSEMIDE 40 MG PO TABS
20.0000 mg | ORAL_TABLET | Freq: Once | ORAL | Status: AC
  Administered 2016-09-04: 20 mg via ORAL
  Filled 2016-09-04: qty 2

## 2016-09-04 MED ORDER — CEPHALEXIN 500 MG PO CAPS: 500.0000 mg | ORAL_CAPSULE | Freq: Two times a day (BID) | ORAL | 0 refills | Status: DC

## 2016-09-04 NOTE — ED Provider Notes (Signed)
[facility] Emergency Department Provider Note  Time seen: 9:07 PM  I have reviewed the triage vital signs and the nursing notes.   HISTORY  Chief Complaint Groin Swelling and Penile Discharge    HPI Glenn [name] is a 75 y.o. male with a past medical history of diabetes, hypertension, MI, presents to the emergency department for scrotal swelling. According to the patient for the past many months he has been having lower extremity swelling. He states for the past 1-2 weeks he has had significant swelling in his scrotum as well. He also states for the past several weeks he has been feeling more short of breath especially with any type of exertion. Denies any chest pain. Denies any leg pain. Denies abdominal pain or known swelling. Denies alcohol use. Denies fever. Patient does state occasional cough over the past several weeks without sputum production.  Past Medical History:  Diagnosis Date  . Diabetes mellitus without complication (HCC)   . Exposure to [employer]   . Hypertension   . MI (myocardial infarction)   . Renal disorder    kidney stones    There are no active problems to display for this patient.   Past Surgical History:  Procedure Laterality Date  . BIOPSY PROSTATE    . CORONARY ARTERY BYPASS GRAFT     x 4  . KIDNEY STONE SURGERY      Prior to Admission medications   Medication Sig Start Date End Date Taking? Authorizing Provider  insulin NPH-regular Human (NOVOLIN 70/30) (70-30) 100 UNIT/ML injection Inject 25 Units into the skin daily.    Historical Provider, MD    No Known Allergies  No family history on file.  Social History Social History  Substance Use Topics  . Smoking status: Former Games developer  . Smokeless tobacco: Never Used  . Alcohol use Yes     Comment: rarely    Review of Systems Constitutional: Negative for fever. Cardiovascular: Negative for chest pain. Respiratory: Mild shortness of breath worse with exertion.  Positive for cough. Gastrointestinal: Negative for abdominal pain Genitourinary: Negative for dysuria. Positive for scrotal swelling. Neurological: Negative for headache 10-point ROS otherwise negative.  ____________________________________________   PHYSICAL EXAM:  VITAL SIGNS: ED Triage Vitals  Enc Vitals Group     BP 09/04/16 1727 (!) 155/108     Pulse Rate 09/04/16 1727 80     Resp 09/04/16 1727 18     Temp 09/04/16 1727 97.9 F (36.6 C)     Temp Source 09/04/16 1727 Oral     SpO2 09/04/16 1727 98 %     Weight 09/04/16 1728 145 lb (65.8 kg)     Height 09/04/16 1728 6' (1.829 m)     Head Circumference --      Peak Flow --      Pain Score --      Pain Loc --      Pain Edu? --      Excl. in GC? --     Constitutional: Alert and oriented. Well appearing and in no distress. Eyes: Normal exam ENT   Head: Normocephalic and atraumatic.   Mouth/Throat: Mucous membranes are moist. Cardiovascular: Normal rate, regular rhythm. No murmur Respiratory: Normal respiratory effort without tachypnea nor retractions. Slight rales in bilateral lower lung fields. Gastrointestinal: Soft and nontender. No distention.   Genitourinary: Quite edematous scrotum mild tenderness. Edematous penis. Patient denies any difficulty urinating. Musculoskeletal: Nontender with normal range of motion in all extremities. 2+ lower extremity edema  bilaterally. Neurologic:  Normal speech and language. No gross focal neurologic deficits  Skin:  Skin is warm, dry and intact.  Psychiatric: Mood and affect are normal.  ____________________________________________    EKG  EKG reviewed and interpreted by myself shows normal sinus rhythm at 79 bpm, widened QRS, left axis deviation, left bundle branch block, no concerning ST changes.  ____________________________________________    RADIOLOGY  IMPRESSION: Bibasilar scarring and/or atelectasis. Cardiomegaly with post CABG change. Slight elevation of  the right hemidiaphragm.  ____________________________________________   INITIAL IMPRESSION / ASSESSMENT AND PLAN / ED COURSE  Pertinent labs & imaging results that were available during my care of the patient were reviewed by me and considered in my medical decision making (see chart for details).  Patient presents to the emergency department with scrotal edema. Patient states many months of lower extremity edema which has worsened. For the past several weeks he has now had increasing scrotal edema as well as mild shortness of breath with cough. On exam the patient does have scrotal and lower extremity edema most consistent with fluid accumulation such as CHF. His BNP is 2500 with a negative troponin. Patient states he has had echocardiograms performed in the past at the [place]. We'll attempt to obtain the patient's records to review these. Patient also states dark urine but denies dysuria. We will check urinalysis. Patient's troponin is negative. Patient denies any current shortness of breath but states he does become short of breath with exertion. Patient has mild rales in the lower lung fields bilaterally, x-rays saying bibasilar atelectasis.  Urinalysis appears show a urinary tract infection. We will start on Keflex twice daily for the next 7 days. Given the patient's scrotal and lower extremity edema we will start on Lasix 20 mg daily for the next 10 days. [facility]. Patient will call tomorrow for an appointment within the next 7 days. I discussed return precautions for any shortness of breath or chest pain. ____________________________________________   FINAL CLINICAL IMPRESSION(S) / ED DIAGNOSES  Peripheral edema    Minna Antis, MD 09/04/16 2314

## 2016-09-04 NOTE — ED Triage Notes (Signed)
Patient presents to the ED with scrotal swelling x 1 week and penile discharge described as "puss".  Patient has CHF and has some swelling to feet and ankles as well.  Patient reports he has been slightly more short of breath lately, patient recently recovered from the flu.

## 2016-09-04 NOTE — Discharge Instructions (Signed)
As we discussed please take your medications as prescribed. Please follow-up with your cardiologist by calling tomorrow to arrange a follow-up appointment as soon as possible for a repeat echocardiogram (ultrasound of the heart). Return to the emergency department for any shortness of breath or any chest pain.

## 2021-07-12 ENCOUNTER — Ambulatory Visit: Admit: 2021-07-12 | Payer: Non-veteran care | Admitting: Internal Medicine

## 2021-07-12 SURGERY — COLONOSCOPY WITH PROPOFOL
Anesthesia: General

## 2021-11-01 ENCOUNTER — Emergency Department: Payer: No Typology Code available for payment source

## 2021-11-01 ENCOUNTER — Other Ambulatory Visit: Payer: Self-pay

## 2021-11-01 ENCOUNTER — Encounter: Payer: Self-pay | Admitting: Emergency Medicine

## 2021-11-01 ENCOUNTER — Inpatient Hospital Stay (HOSPITAL_COMMUNITY)
Admission: AD | Admit: 2021-11-01 | Discharge: 2022-01-02 | DRG: 871 | Disposition: E | Payer: Medicare Other | Source: Other Acute Inpatient Hospital | Attending: Internal Medicine | Admitting: Internal Medicine

## 2021-11-01 ENCOUNTER — Emergency Department
Admission: EM | Admit: 2021-11-01 | Discharge: 2021-11-01 | Disposition: A | Discharge status: Other Acute Inpatient Hospital | Payer: No Typology Code available for payment source | Attending: Student in an Organized Health Care Education/Training Program | Admitting: Student in an Organized Health Care Education/Training Program

## 2021-11-01 DIAGNOSIS — I130 Hypertensive heart and chronic kidney disease with heart failure and stage 1 through stage 4 chronic kidney disease, or unspecified chronic kidney disease: Secondary | ICD-10-CM | POA: Diagnosis present

## 2021-11-01 DIAGNOSIS — Z79899 Other long term (current) drug therapy: Secondary | ICD-10-CM

## 2021-11-01 DIAGNOSIS — I5033 Acute on chronic diastolic (congestive) heart failure: Secondary | ICD-10-CM | POA: Diagnosis not present

## 2021-11-01 DIAGNOSIS — I2510 Atherosclerotic heart disease of native coronary artery without angina pectoris: Secondary | ICD-10-CM | POA: Diagnosis not present

## 2021-11-01 DIAGNOSIS — N189 Chronic kidney disease, unspecified: Secondary | ICD-10-CM

## 2021-11-01 DIAGNOSIS — E119 Type 2 diabetes mellitus without complications: Secondary | ICD-10-CM | POA: Insufficient documentation

## 2021-11-01 DIAGNOSIS — N12 Tubulo-interstitial nephritis, not specified as acute or chronic: Secondary | ICD-10-CM | POA: Diagnosis not present

## 2021-11-01 DIAGNOSIS — R7401 Elevation of levels of liver transaminase levels: Secondary | ICD-10-CM

## 2021-11-01 DIAGNOSIS — I480 Paroxysmal atrial fibrillation: Secondary | ICD-10-CM

## 2021-11-01 DIAGNOSIS — I10 Essential (primary) hypertension: Secondary | ICD-10-CM | POA: Insufficient documentation

## 2021-11-01 DIAGNOSIS — E8809 Other disorders of plasma-protein metabolism, not elsewhere classified: Secondary | ICD-10-CM | POA: Diagnosis not present

## 2021-11-01 DIAGNOSIS — A419 Sepsis, unspecified organism: Principal | ICD-10-CM | POA: Diagnosis present

## 2021-11-01 DIAGNOSIS — Z791 Long term (current) use of non-steroidal anti-inflammatories (NSAID): Secondary | ICD-10-CM

## 2021-11-01 DIAGNOSIS — R748 Abnormal levels of other serum enzymes: Secondary | ICD-10-CM | POA: Diagnosis not present

## 2021-11-01 DIAGNOSIS — I5042 Chronic combined systolic (congestive) and diastolic (congestive) heart failure: Secondary | ICD-10-CM

## 2021-11-01 DIAGNOSIS — E871 Hypo-osmolality and hyponatremia: Secondary | ICD-10-CM | POA: Diagnosis present

## 2021-11-01 DIAGNOSIS — R570 Cardiogenic shock: Secondary | ICD-10-CM | POA: Diagnosis present

## 2021-11-01 DIAGNOSIS — I248 Other forms of acute ischemic heart disease: Secondary | ICD-10-CM | POA: Diagnosis present

## 2021-11-01 DIAGNOSIS — I4891 Unspecified atrial fibrillation: Secondary | ICD-10-CM | POA: Insufficient documentation

## 2021-11-01 DIAGNOSIS — R778 Other specified abnormalities of plasma proteins: Secondary | ICD-10-CM | POA: Diagnosis not present

## 2021-11-01 DIAGNOSIS — M7989 Other specified soft tissue disorders: Secondary | ICD-10-CM | POA: Diagnosis not present

## 2021-11-01 DIAGNOSIS — E1122 Type 2 diabetes mellitus with diabetic chronic kidney disease: Secondary | ICD-10-CM | POA: Diagnosis present

## 2021-11-01 DIAGNOSIS — M4646 Discitis, unspecified, lumbar region: Secondary | ICD-10-CM | POA: Diagnosis not present

## 2021-11-01 DIAGNOSIS — Z87442 Personal history of urinary calculi: Secondary | ICD-10-CM

## 2021-11-01 DIAGNOSIS — B961 Klebsiella pneumoniae [K. pneumoniae] as the cause of diseases classified elsewhere: Secondary | ICD-10-CM | POA: Diagnosis not present

## 2021-11-01 DIAGNOSIS — Z951 Presence of aortocoronary bypass graft: Secondary | ICD-10-CM

## 2021-11-01 DIAGNOSIS — Z9079 Acquired absence of other genital organ(s): Secondary | ICD-10-CM

## 2021-11-01 DIAGNOSIS — K6812 Psoas muscle abscess: Secondary | ICD-10-CM

## 2021-11-01 DIAGNOSIS — M4626 Osteomyelitis of vertebra, lumbar region: Secondary | ICD-10-CM

## 2021-11-01 DIAGNOSIS — D696 Thrombocytopenia, unspecified: Secondary | ICD-10-CM

## 2021-11-01 DIAGNOSIS — Z794 Long term (current) use of insulin: Secondary | ICD-10-CM

## 2021-11-01 DIAGNOSIS — N1832 Chronic kidney disease, stage 3b: Secondary | ICD-10-CM

## 2021-11-01 DIAGNOSIS — N179 Acute kidney failure, unspecified: Secondary | ICD-10-CM | POA: Diagnosis not present

## 2021-11-01 DIAGNOSIS — E440 Moderate protein-calorie malnutrition: Secondary | ICD-10-CM

## 2021-11-01 DIAGNOSIS — R7989 Other specified abnormal findings of blood chemistry: Secondary | ICD-10-CM | POA: Diagnosis not present

## 2021-11-01 DIAGNOSIS — N320 Bladder-neck obstruction: Secondary | ICD-10-CM

## 2021-11-01 DIAGNOSIS — I255 Ischemic cardiomyopathy: Secondary | ICD-10-CM | POA: Diagnosis present

## 2021-11-01 DIAGNOSIS — Z681 Body mass index (BMI) 19 or less, adult: Secondary | ICD-10-CM

## 2021-11-01 DIAGNOSIS — I5023 Acute on chronic systolic (congestive) heart failure: Secondary | ICD-10-CM

## 2021-11-01 DIAGNOSIS — I472 Ventricular tachycardia, unspecified: Secondary | ICD-10-CM | POA: Diagnosis not present

## 2021-11-01 DIAGNOSIS — R197 Diarrhea, unspecified: Secondary | ICD-10-CM | POA: Diagnosis not present

## 2021-11-01 DIAGNOSIS — D6959 Other secondary thrombocytopenia: Secondary | ICD-10-CM | POA: Diagnosis present

## 2021-11-01 DIAGNOSIS — A4159 Other Gram-negative sepsis: Principal | ICD-10-CM | POA: Diagnosis present

## 2021-11-01 DIAGNOSIS — R54 Age-related physical debility: Secondary | ICD-10-CM | POA: Diagnosis present

## 2021-11-01 DIAGNOSIS — Z515 Encounter for palliative care: Secondary | ICD-10-CM

## 2021-11-01 DIAGNOSIS — I252 Old myocardial infarction: Secondary | ICD-10-CM

## 2021-11-01 DIAGNOSIS — Z95810 Presence of automatic (implantable) cardiac defibrillator: Secondary | ICD-10-CM

## 2021-11-01 DIAGNOSIS — R7881 Bacteremia: Secondary | ICD-10-CM

## 2021-11-01 DIAGNOSIS — Z20822 Contact with and (suspected) exposure to covid-19: Secondary | ICD-10-CM | POA: Diagnosis not present

## 2021-11-01 DIAGNOSIS — I493 Ventricular premature depolarization: Secondary | ICD-10-CM | POA: Diagnosis not present

## 2021-11-01 DIAGNOSIS — E785 Hyperlipidemia, unspecified: Secondary | ICD-10-CM | POA: Diagnosis present

## 2021-11-01 DIAGNOSIS — D631 Anemia in chronic kidney disease: Secondary | ICD-10-CM | POA: Diagnosis present

## 2021-11-01 DIAGNOSIS — W1830XA Fall on same level, unspecified, initial encounter: Secondary | ICD-10-CM | POA: Diagnosis present

## 2021-11-01 DIAGNOSIS — I5043 Acute on chronic combined systolic (congestive) and diastolic (congestive) heart failure: Secondary | ICD-10-CM | POA: Diagnosis not present

## 2021-11-01 DIAGNOSIS — L97509 Non-pressure chronic ulcer of other part of unspecified foot with unspecified severity: Secondary | ICD-10-CM

## 2021-11-01 DIAGNOSIS — E876 Hypokalemia: Secondary | ICD-10-CM | POA: Diagnosis not present

## 2021-11-01 DIAGNOSIS — L89896 Pressure-induced deep tissue damage of other site: Secondary | ICD-10-CM | POA: Diagnosis present

## 2021-11-01 DIAGNOSIS — Z8249 Family history of ischemic heart disease and other diseases of the circulatory system: Secondary | ICD-10-CM

## 2021-11-01 DIAGNOSIS — M4640 Discitis, unspecified, site unspecified: Secondary | ICD-10-CM

## 2021-11-01 DIAGNOSIS — Z87891 Personal history of nicotine dependence: Secondary | ICD-10-CM

## 2021-11-01 DIAGNOSIS — E1165 Type 2 diabetes mellitus with hyperglycemia: Secondary | ICD-10-CM | POA: Diagnosis present

## 2021-11-01 DIAGNOSIS — R262 Difficulty in walking, not elsewhere classified: Secondary | ICD-10-CM | POA: Diagnosis present

## 2021-11-01 DIAGNOSIS — D509 Iron deficiency anemia, unspecified: Secondary | ICD-10-CM | POA: Diagnosis present

## 2021-11-01 DIAGNOSIS — E861 Hypovolemia: Secondary | ICD-10-CM | POA: Diagnosis present

## 2021-11-01 DIAGNOSIS — I272 Pulmonary hypertension, unspecified: Secondary | ICD-10-CM | POA: Diagnosis not present

## 2021-11-01 DIAGNOSIS — R64 Cachexia: Secondary | ICD-10-CM | POA: Diagnosis present

## 2021-11-01 DIAGNOSIS — R6521 Severe sepsis with septic shock: Secondary | ICD-10-CM | POA: Diagnosis present

## 2021-11-01 DIAGNOSIS — G4700 Insomnia, unspecified: Secondary | ICD-10-CM | POA: Diagnosis not present

## 2021-11-01 DIAGNOSIS — D689 Coagulation defect, unspecified: Secondary | ICD-10-CM

## 2021-11-01 DIAGNOSIS — Z950 Presence of cardiac pacemaker: Secondary | ICD-10-CM | POA: Diagnosis not present

## 2021-11-01 DIAGNOSIS — E11649 Type 2 diabetes mellitus with hypoglycemia without coma: Secondary | ICD-10-CM | POA: Diagnosis not present

## 2021-11-01 DIAGNOSIS — N136 Pyonephrosis: Secondary | ICD-10-CM | POA: Diagnosis present

## 2021-11-01 DIAGNOSIS — M6259 Muscle wasting and atrophy, not elsewhere classified, multiple sites: Secondary | ICD-10-CM | POA: Diagnosis present

## 2021-11-01 DIAGNOSIS — E1169 Type 2 diabetes mellitus with other specified complication: Secondary | ICD-10-CM | POA: Diagnosis present

## 2021-11-01 DIAGNOSIS — Z66 Do not resuscitate: Secondary | ICD-10-CM | POA: Diagnosis not present

## 2021-11-01 DIAGNOSIS — S90829A Blister (nonthermal), unspecified foot, initial encounter: Secondary | ICD-10-CM | POA: Diagnosis present

## 2021-11-01 DIAGNOSIS — S80812A Abrasion, left lower leg, initial encounter: Secondary | ICD-10-CM | POA: Diagnosis present

## 2021-11-01 DIAGNOSIS — R531 Weakness: Secondary | ICD-10-CM | POA: Diagnosis present

## 2021-11-01 DIAGNOSIS — I5A Non-ischemic myocardial injury (non-traumatic): Secondary | ICD-10-CM

## 2021-11-01 DIAGNOSIS — F419 Anxiety disorder, unspecified: Secondary | ICD-10-CM | POA: Diagnosis not present

## 2021-11-01 DIAGNOSIS — Z7901 Long term (current) use of anticoagulants: Secondary | ICD-10-CM

## 2021-11-01 DIAGNOSIS — Z7189 Other specified counseling: Secondary | ICD-10-CM | POA: Diagnosis not present

## 2021-11-01 DIAGNOSIS — Z7984 Long term (current) use of oral hypoglycemic drugs: Secondary | ICD-10-CM

## 2021-11-01 DIAGNOSIS — N401 Enlarged prostate with lower urinary tract symptoms: Secondary | ICD-10-CM | POA: Diagnosis present

## 2021-11-01 HISTORY — DX: Presence of automatic (implantable) cardiac defibrillator: Z95.810

## 2021-11-01 HISTORY — DX: Disorders of diaphragm: J98.6

## 2021-11-01 HISTORY — DX: Ischemic cardiomyopathy: I25.5

## 2021-11-01 HISTORY — DX: Atherosclerotic heart disease of native coronary artery without angina pectoris: I25.10

## 2021-11-01 HISTORY — DX: Other ventricular tachycardia: I47.29

## 2021-11-01 LAB — BASIC METABOLIC PANEL
Anion gap: 14 (ref 5–15)
BUN: 115 mg/dL — ABNORMAL HIGH (ref 8–23)
CO2: 21 mmol/L — ABNORMAL LOW (ref 22–32)
Calcium: 8.2 mg/dL — ABNORMAL LOW (ref 8.9–10.3)
Chloride: 94 mmol/L — ABNORMAL LOW (ref 98–111)
Creatinine, Ser: 3.62 mg/dL — ABNORMAL HIGH (ref 0.61–1.24)
GFR, Estimated: 16 mL/min — ABNORMAL LOW (ref >60)
Glucose, Bld: 416 mg/dL — ABNORMAL HIGH (ref 70–99)
Potassium: 3.9 mmol/L (ref 3.5–5.1)
Sodium: 129 mmol/L — ABNORMAL LOW (ref 135–145)

## 2021-11-01 LAB — URINALYSIS, COMPLETE (UACMP) WITH MICROSCOPIC
Bilirubin Urine: NEGATIVE
Glucose, UA: >=500 mg/dL — AB
Ketones, ur: NEGATIVE mg/dL
Nitrite: NEGATIVE
Protein, ur: 30 mg/dL — AB
Specific Gravity, Urine: 1.01 (ref 1.005–1.030)
WBC, UA: >50 WBC/hpf — ABNORMAL HIGH (ref 0–5)
pH: 5 (ref 5.0–8.0)

## 2021-11-01 LAB — BLOOD GAS, VENOUS
Acid-base deficit: 8.7 mmol/L — ABNORMAL HIGH (ref 0.0–2.0)
Bicarbonate: 17.9 mmol/L — ABNORMAL LOW (ref 20.0–28.0)
O2 Saturation: 82 %
Patient temperature: 37
pCO2, Ven: 40 mmHg — ABNORMAL LOW (ref 44–60)
pH, Ven: 7.26 (ref 7.25–7.43)
pO2, Ven: 54 mmHg — ABNORMAL HIGH (ref 32–45)

## 2021-11-01 LAB — COMPREHENSIVE METABOLIC PANEL
ALT: 49 U/L — ABNORMAL HIGH (ref 0–44)
AST: 74 U/L — ABNORMAL HIGH (ref 15–41)
Albumin: 2.6 g/dL — ABNORMAL LOW (ref 3.5–5.0)
Alkaline Phosphatase: 164 U/L — ABNORMAL HIGH (ref 38–126)
Anion gap: 13 (ref 5–15)
BUN: 116 mg/dL — ABNORMAL HIGH (ref 8–23)
CO2: 19 mmol/L — ABNORMAL LOW (ref 22–32)
Calcium: 8 mg/dL — ABNORMAL LOW (ref 8.9–10.3)
Chloride: 97 mmol/L — ABNORMAL LOW (ref 98–111)
Creatinine, Ser: 3.16 mg/dL — ABNORMAL HIGH (ref 0.61–1.24)
GFR, Estimated: 19 mL/min — ABNORMAL LOW (ref >60)
Glucose, Bld: 379 mg/dL — ABNORMAL HIGH (ref 70–99)
Potassium: 3.8 mmol/L (ref 3.5–5.1)
Sodium: 129 mmol/L — ABNORMAL LOW (ref 135–145)
Total Bilirubin: 1.4 mg/dL — ABNORMAL HIGH (ref 0.3–1.2)
Total Protein: 5.8 g/dL — ABNORMAL LOW (ref 6.5–8.1)

## 2021-11-01 LAB — GLUCOSE, CAPILLARY: Glucose-Capillary: 366 mg/dL — ABNORMAL HIGH (ref 70–99)

## 2021-11-01 LAB — CBC
HCT: 34.2 % — ABNORMAL LOW (ref 39.0–52.0)
Hemoglobin: 11.2 g/dL — ABNORMAL LOW (ref 13.0–17.0)
MCH: 27.2 pg (ref 26.0–34.0)
MCHC: 32.7 g/dL (ref 30.0–36.0)
MCV: 83 fL (ref 80.0–100.0)
Platelets: 104 10*3/uL — ABNORMAL LOW (ref 150–400)
RBC: 4.12 MIL/uL — ABNORMAL LOW (ref 4.22–5.81)
RDW: 15.4 % (ref 11.5–15.5)
WBC: 24.7 10*3/uL — ABNORMAL HIGH (ref 4.0–10.5)
nRBC: 0 % (ref 0.0–0.2)

## 2021-11-01 LAB — CBG MONITORING, ED: Glucose-Capillary: 392 mg/dL — ABNORMAL HIGH (ref 70–99)

## 2021-11-01 LAB — RESP PANEL BY RT-PCR (FLU A&B, COVID) ARPGX2
Influenza A by PCR: NEGATIVE
Influenza B by PCR: NEGATIVE
SARS Coronavirus 2 by RT PCR: NEGATIVE

## 2021-11-01 LAB — PROTIME-INR
INR: 3.1 — ABNORMAL HIGH (ref 0.8–1.2)
Prothrombin Time: 31.5 seconds — ABNORMAL HIGH (ref 11.4–15.2)

## 2021-11-01 LAB — APTT: aPTT: 46 seconds — ABNORMAL HIGH (ref 24–36)

## 2021-11-01 LAB — LACTIC ACID, PLASMA: Lactic Acid, Venous: 1.7 mmol/L (ref 0.5–1.9)

## 2021-11-01 IMAGING — DX DG FOOT COMPLETE 3+V*L*
3 series · 3 of 3 positions shown · non-contrast
Comparison: None Available.

CLINICAL DATA: Left foot wound.

EXAM:
LEFT FOOT - COMPLETE 3+ VIEW

[foot ap]
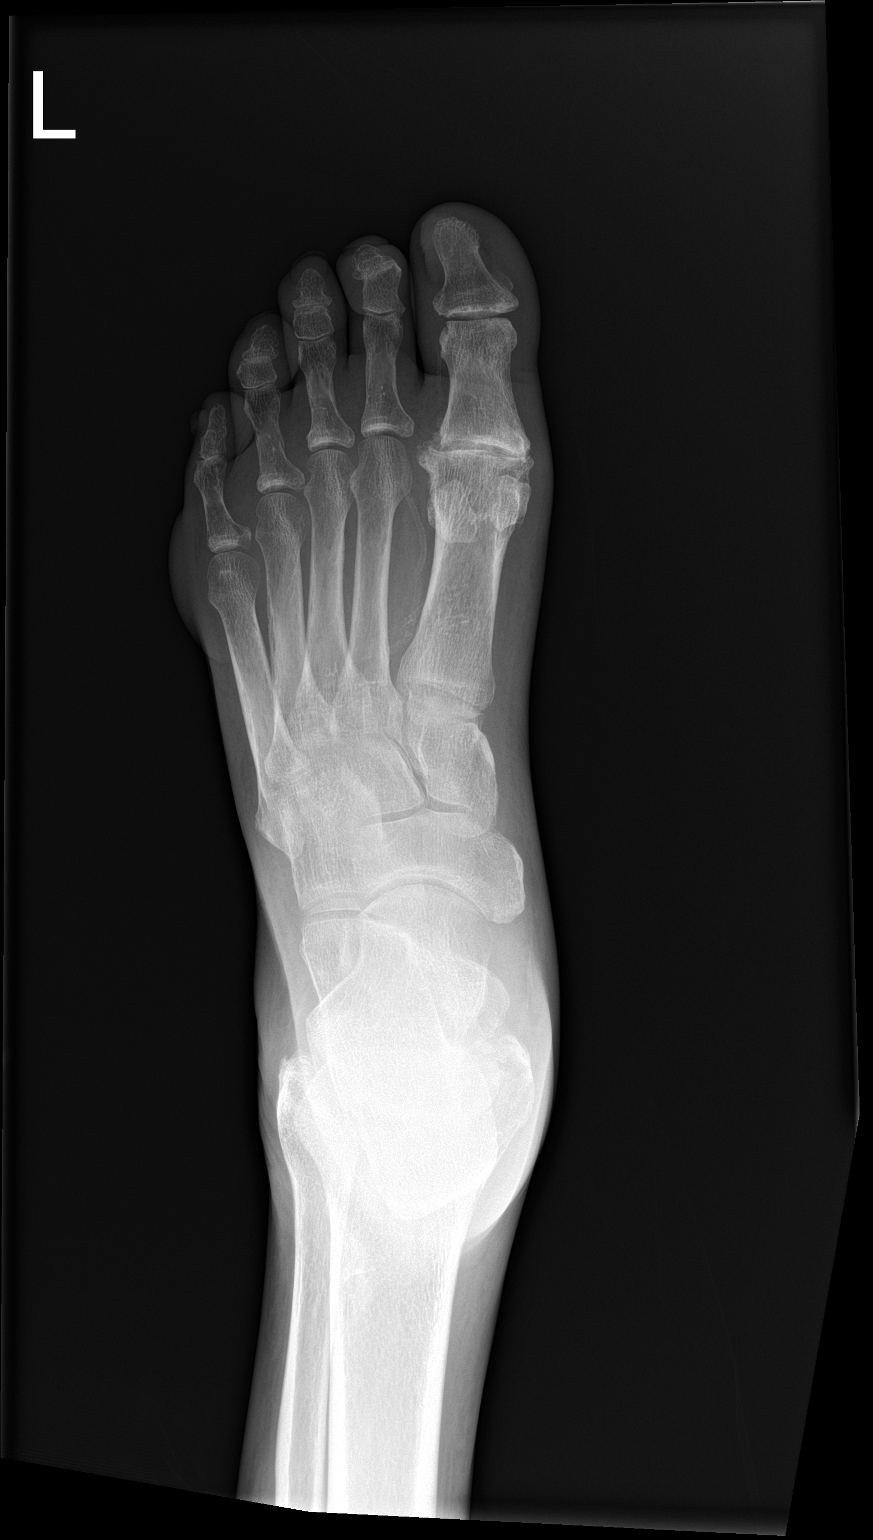

[foot obl]
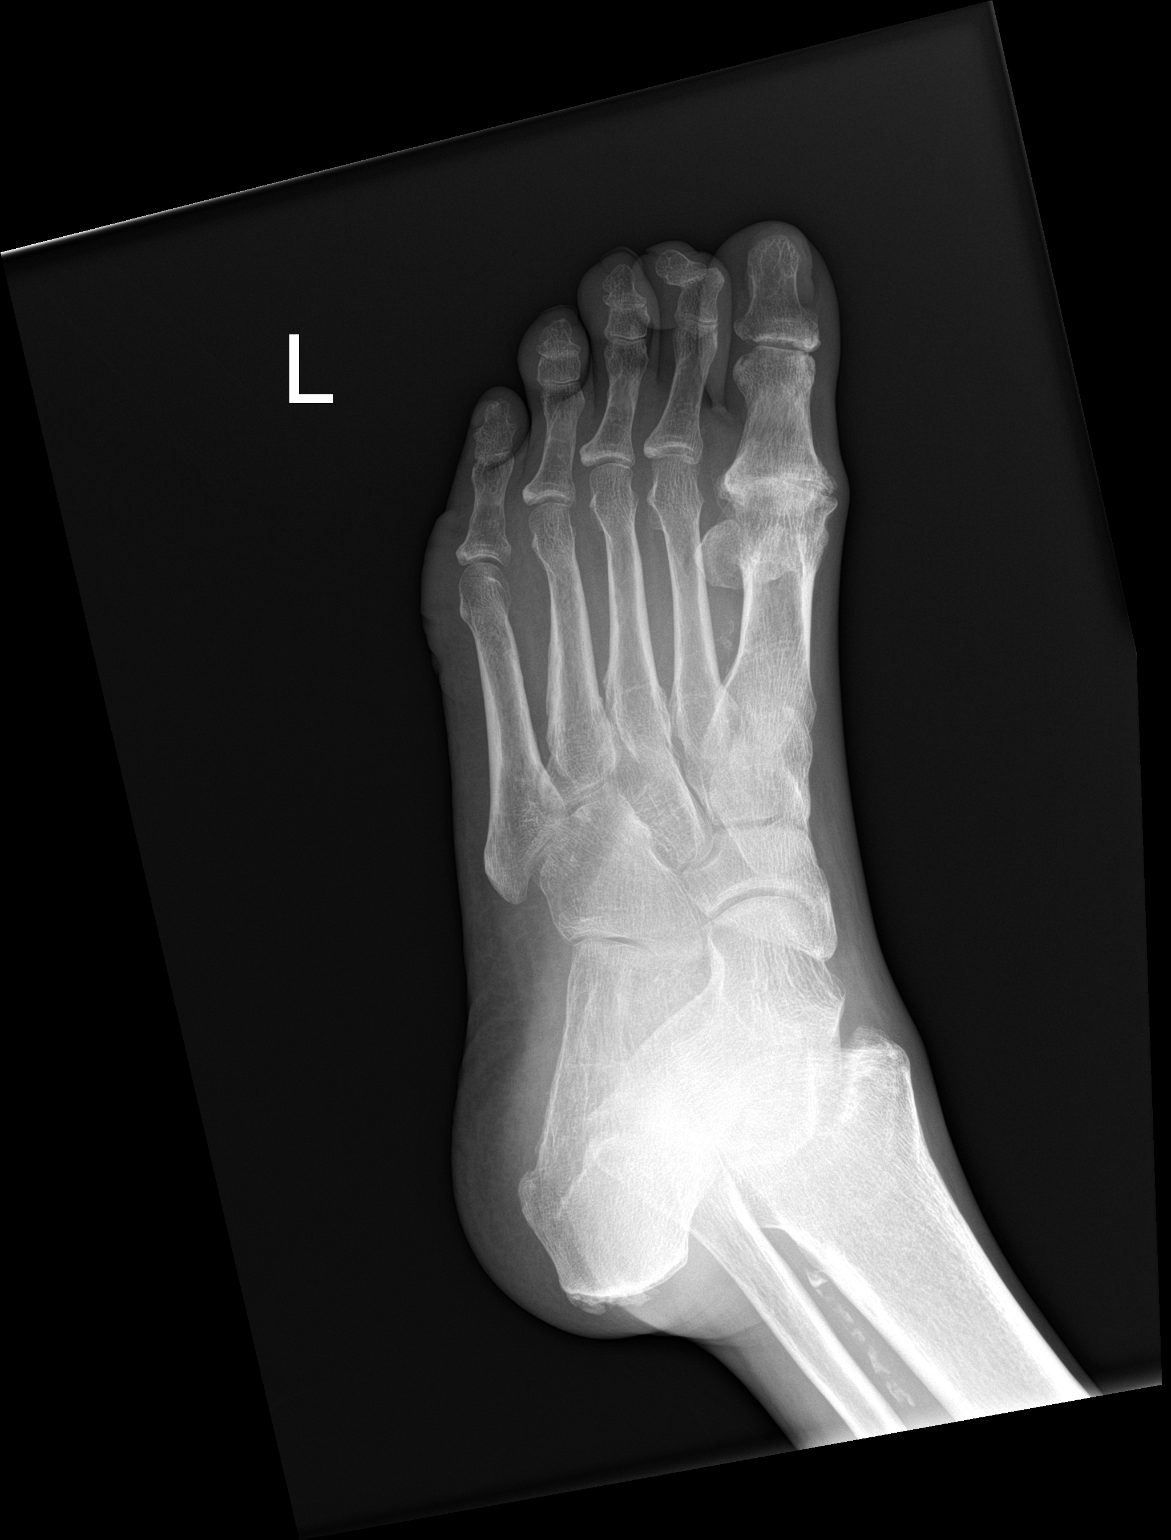

[foot lat]
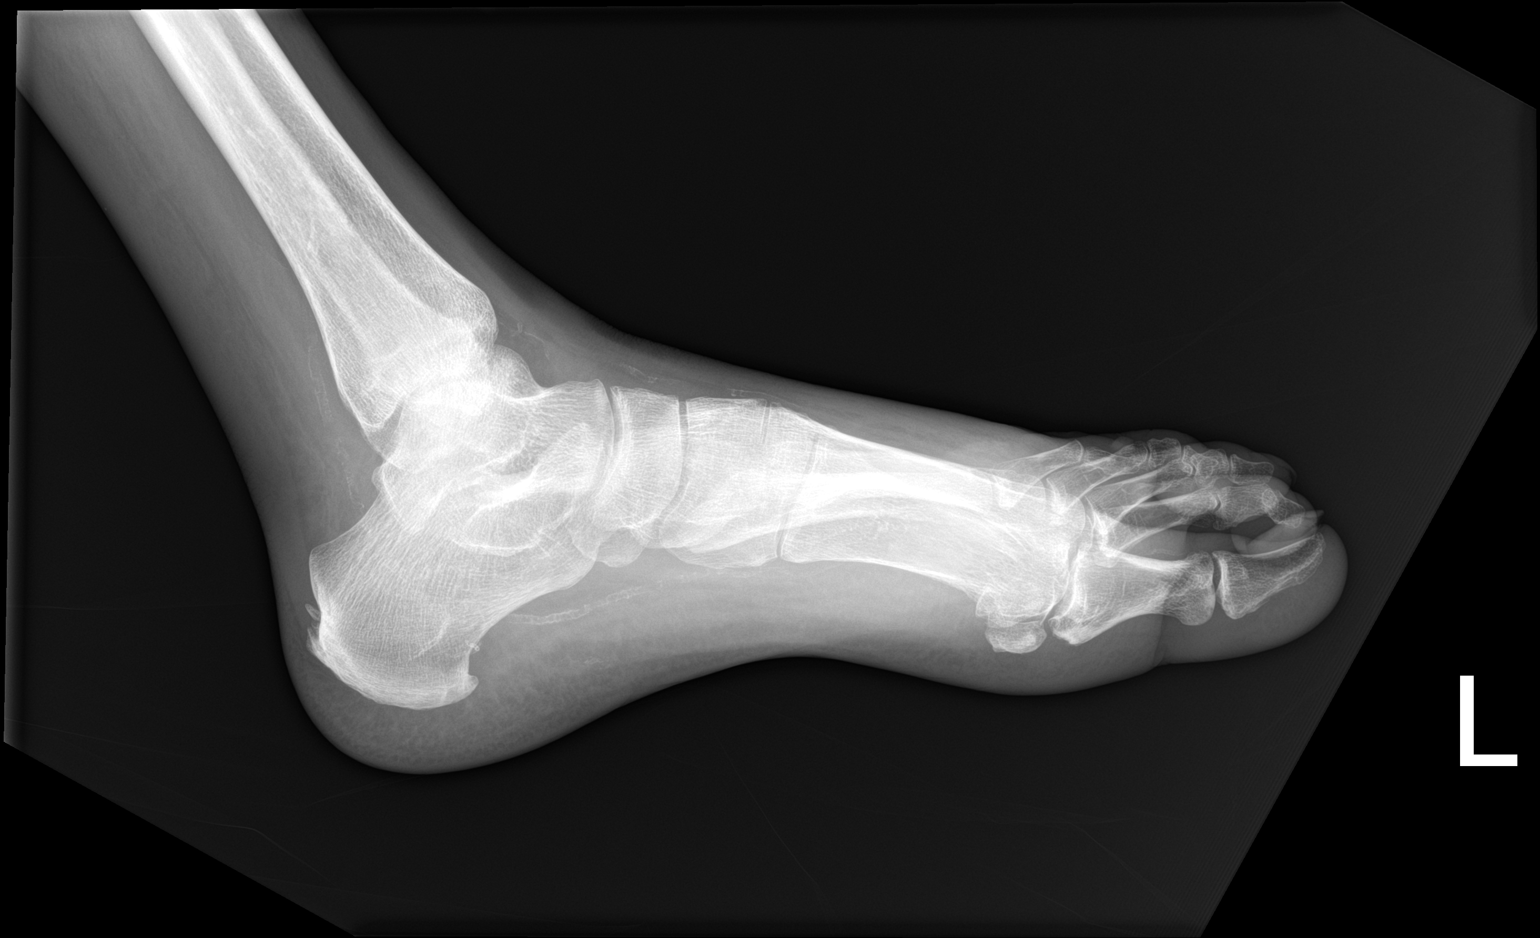

[3 of 3 positions shown; findings below may reference images not displayed]

FINDINGS: There is severe great toe metatarsophalangeal joint space narrowing
and peripheral osteophytosis with moderate subchondral sclerosis.
Mild-to-moderate joint space narrowing of the interphalangeal joints
diffusely. Small plantar calcaneal heel spur. Mild chronic spurring
at the Achilles insertion on the calcaneus. Mild vascular
calcifications. Mild soft tissue swelling/enlargement lateral to the
fifth metatarsophalangeal joint with convex lateral border.

No acute fracture or dislocation.
IMPRESSION: 1. Severe great toe metatarsophalangeal joint osteoarthritis.
2. Mild soft tissue swelling/enlarged and lateral to the fifth
metatarsophalangeal joint with convex lateral border. Recommend
clinical correlation for reported wound versus possible mass.
3. Small plantar calcaneal heel spur.

## 2021-11-01 IMAGING — CT CT ABD-PELV W/O CM
2 of 4 series · 14 of 46 positions shown, 16 images · non-contrast
Comparison: [DATE].

CLINICAL DATA: A 80-year-old male presents with blunt abdominal
trauma by report. Question of sepsis and weakness, also potentially
hit lower back and having pain in the buttocks.



[Series 3: ap without · axial · non-contrast · 0.65mm/px · z∈[-808,-343]mm · 11 of 107 slices shown, 13 images]
[im 7/107  soft-tissue]
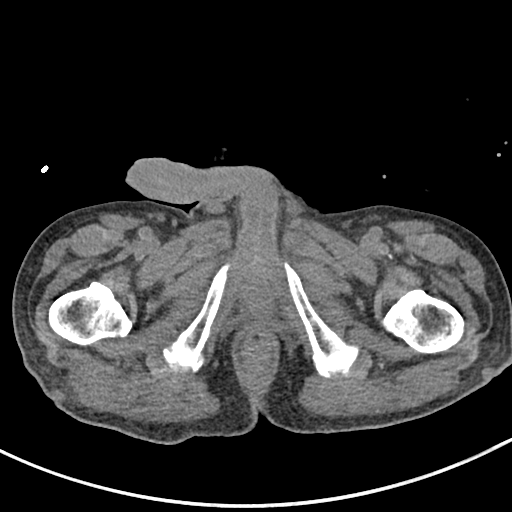
[im 7/107  bone]
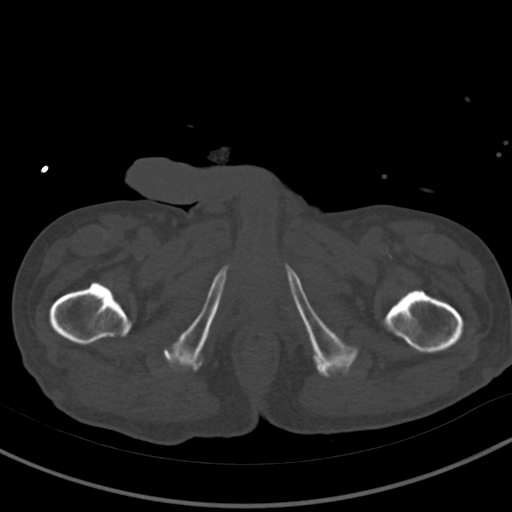
[im 19/107  soft-tissue]
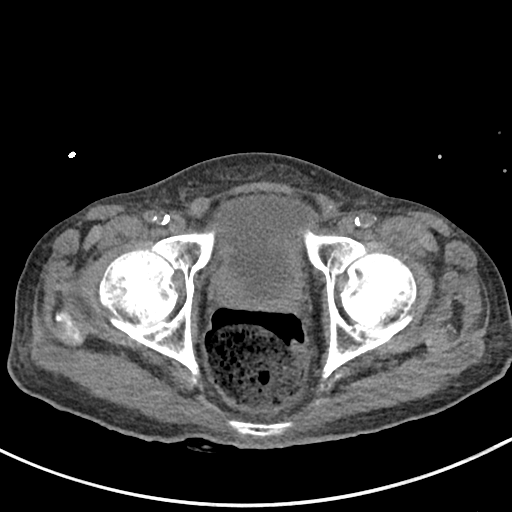
[im 25/107  soft-tissue]
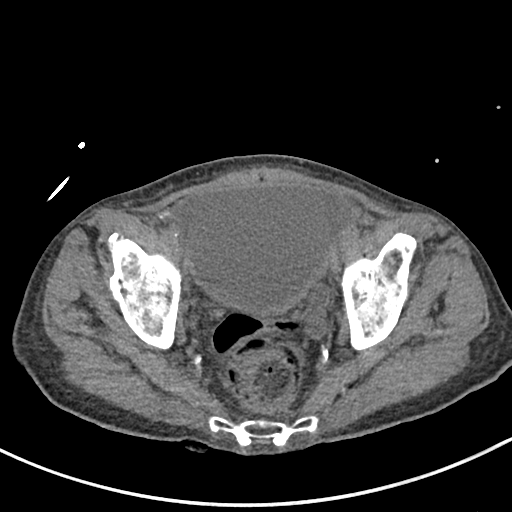
[im 38/107  soft-tissue]
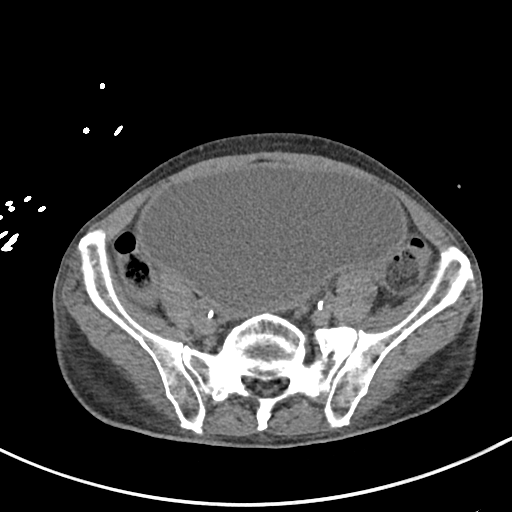
[im 44/107  soft-tissue]
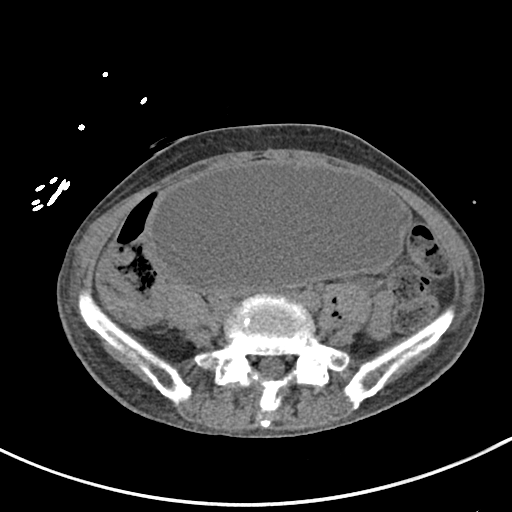
[im 57/107  soft-tissue]
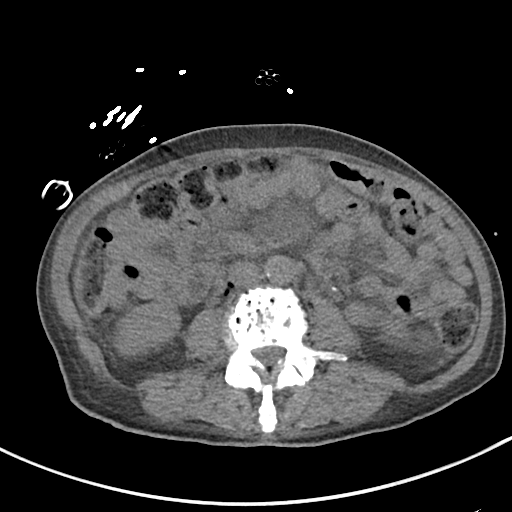
[im 63/107  soft-tissue]
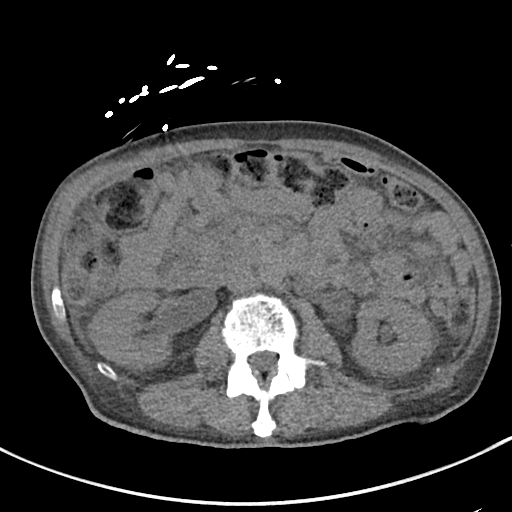
[im 69/107  soft-tissue]
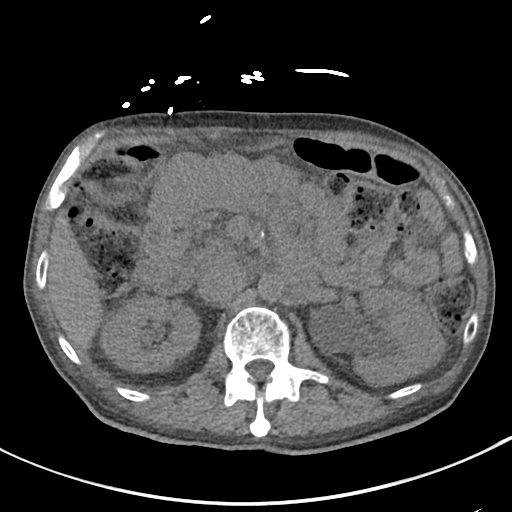
[im 82/107  soft-tissue]
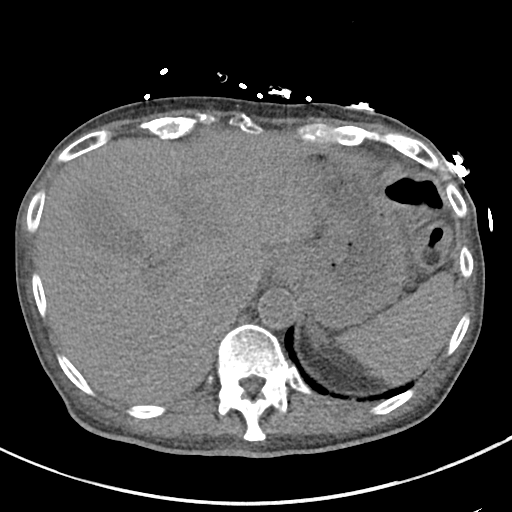
[im 82/107  bone]
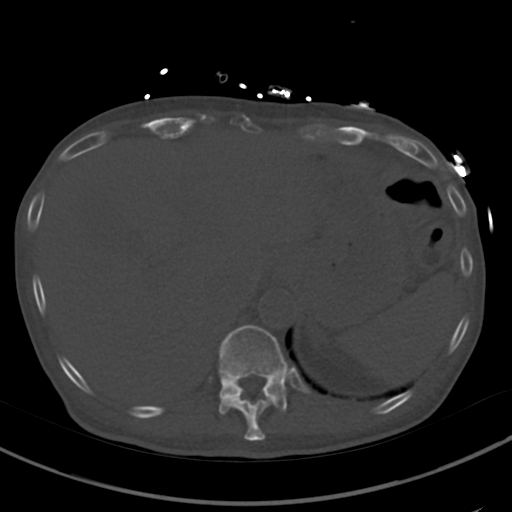
[im 88/107  soft-tissue]
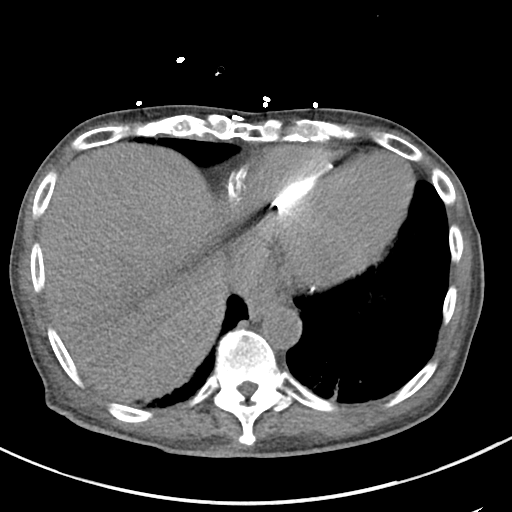
[im 100/107  soft-tissue]
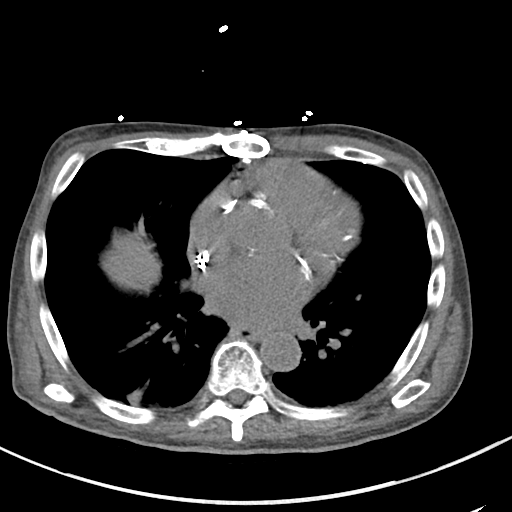

[Series 6: cor · coronal · 0.74mm/px · 3 of 83 slices shown]
[im 28/83  soft-tissue]
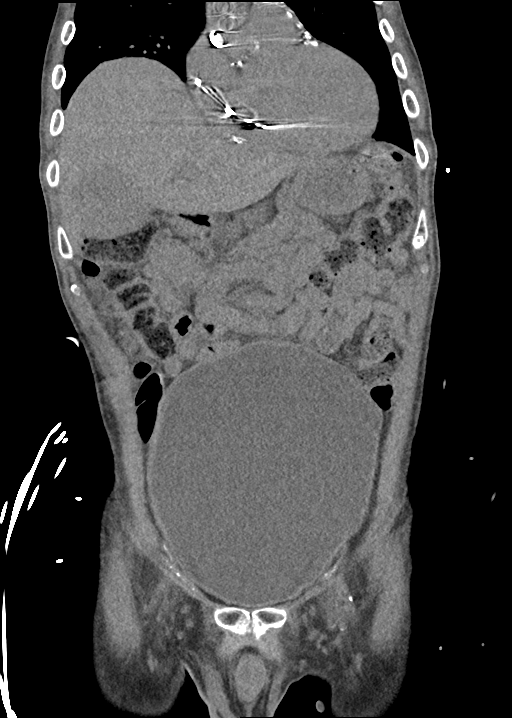
[im 37/83  soft-tissue]
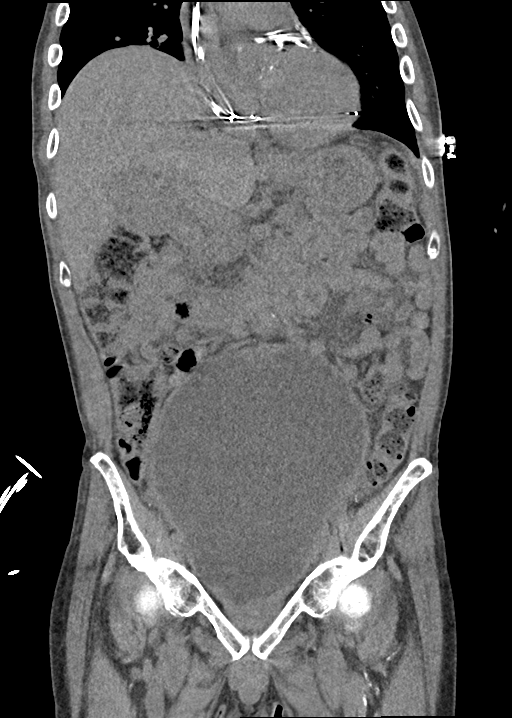
[im 46/83  soft-tissue]
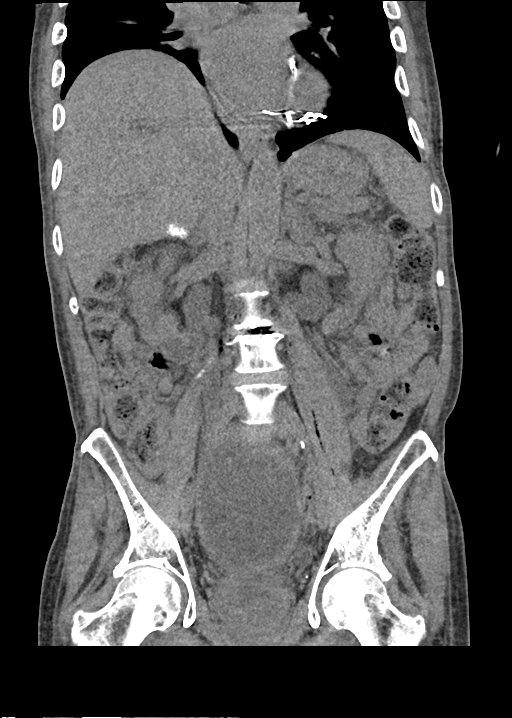

[14 of 46 positions shown; findings below may reference images not displayed]

FINDINGS: Lower chest: Basilar atelectasis and or scarring at the lung bases.
Heart size is enlarged as before. Signs of multi lead pacer device
in prior percutaneous coronary intervention as well as sternotomy.

Hepatobiliary: Liver with smooth contours. Gallbladder with similar
degree of moderate distension and cholelithiasis in the dependent
portion the gallbladder. No gross pericholecystic stranding.

Pancreas: No gross pancreatic inflammation, limited assessment due
to lack of intravenous contrast.

Spleen: Normal without signs of adjacent stranding.

Adrenals/Urinary Tract: Adrenal glands are normal.

Mild LEFT ureteral dilation and mild LEFT hydronephrosis. Mild
fullness of RIGHT renal pelvis and ureteral dilation in the setting
of marked distension of the urinary bladder. Urinary bladder is not
as distended as on the study [DATE] though still extends
well into the abdomen, to approximately the L3 level on the sagittal
reconstructions. Renal contours are similar to prior imaging. No
marked perinephric stranding.

Stomach/Bowel:

No signs of bowel obstruction. Generalized mesenteric edema.

The appendix is not visible, no focal signs about the cecum to
suggest appendiceal inflammation.

Vascular/Lymphatic: Aortic atherosclerosis. There is no
gastrohepatic or hepatoduodenal ligament lymphadenopathy. No
retroperitoneal or mesenteric lymphadenopathy.

No pelvic sidewall lymphadenopathy.

Reproductive: Prostatomegaly as before.

Other: Gas locules within the LEFT psoas muscle. Small gas locules
adjacent to the IVC. Gas within the disc space at the L2-L3 level
with new areas of irregularity and disc space narrowing along the
L2-3 disc space. Small locules of gas in the epidural space
posteriorly. Retroperitoneal gas is greatest on the LEFT within the
LEFT psoas. No signs of fracture or traumatic subluxation in the
spine. Phenomenon

Musculoskeletal: As above mild body wall edema.
IMPRESSION: 1. Gas within the disc space at L2-3 and dissemination of gas into
the LEFT psoas and RIGHT retroperitoneum as well as potentially in
the epidural space posterior to L2. Findings raise the question of
discitis/osteomyelitis with gas-forming organism. Sampling of this
area or MRI as possible may be helpful for further evaluation.
2. Signs of marked urinary bladder distension compatible with
bladder outlet obstruction with less collecting system dilation than
on previous imaging. Correlate with signs of urinary tract infection
and or urosepsis.
3. Generalized mesenteric and body wall edema.
4. Cholelithiasis without evidence of acute cholecystitis.
5. Marked cardiomegaly with pacer defibrillator.

Aortic Atherosclerosis ([UX]-[UX]).

## 2021-11-01 IMAGING — DX DG CHEST 1V PORT
1 series · 1 of 1 positions shown · non-contrast
Comparison: [DATE]

CLINICAL DATA: Possible sepsis

EXAM:
PORTABLE CHEST 1 VIEW

[chest ap]
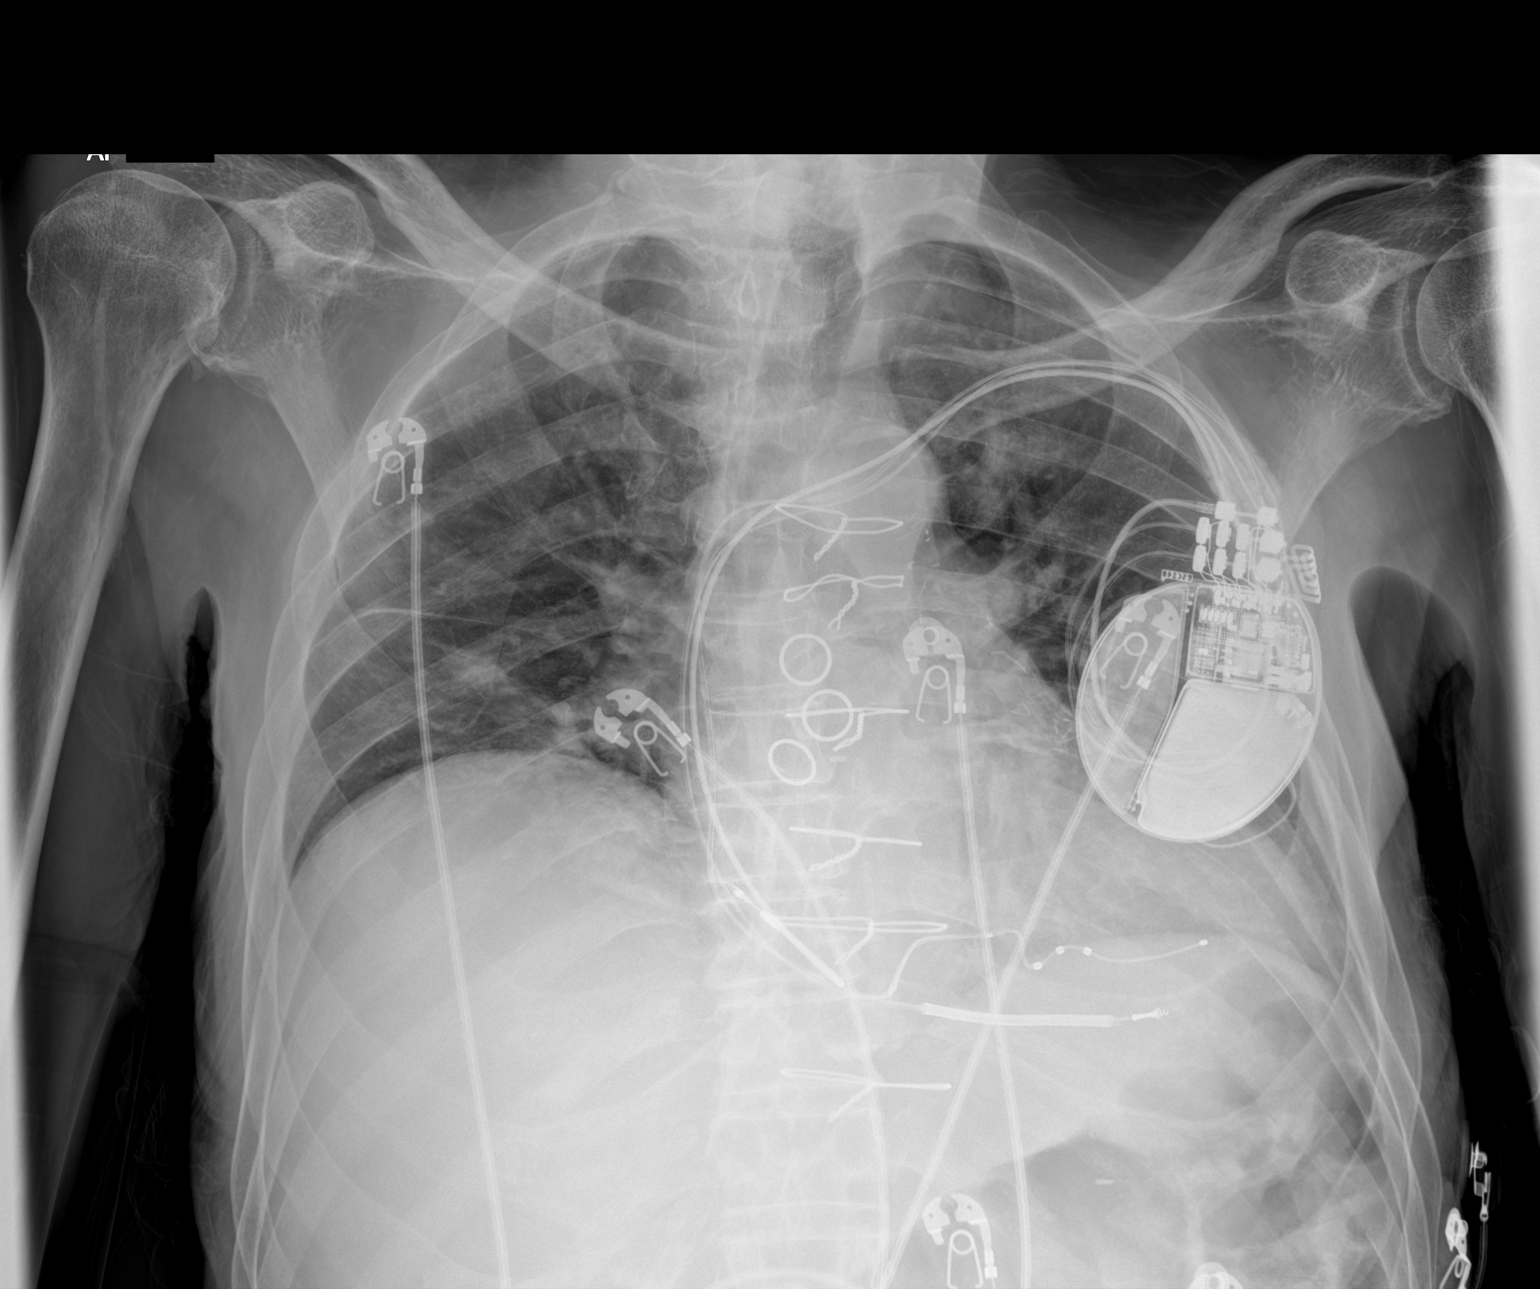

[1 of 1 positions shown; findings below may reference images not displayed]

FINDINGS: Left-sided implanted cardiac device. Heart size within normal limits
status post median sternotomy and CABG. Aortic atherosclerosis. Low
lung volumes. Small focal somewhat nodular opacity at the right lung
base. No pleural effusion or pneumothorax.
IMPRESSION: Small focal opacity at the right lung base. Findings could represent
atelectasis, infection, versus pulmonary nodule. Recommend repeat
inspiratory PA and lateral radiographs of the chest to see if this
finding persists.

## 2021-11-01 IMAGING — CT CT HEAD W/O CM
4 series · 16 of 47 positions shown, 18 images · non-contrast
Comparison: None Available.

CLINICAL DATA: Head trauma, moderate-severe



[Series 2: head wo · axial · 0.41mm/px · z∈[-105,+15]mm · 7 of 33 slices shown, 9 images]
[im 5/33  brain]
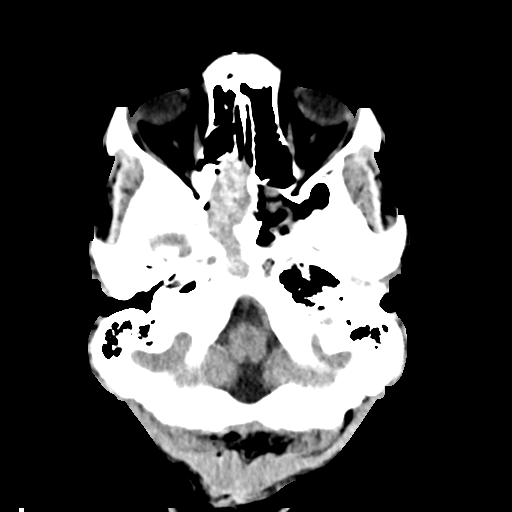
[im 5/33  bone]
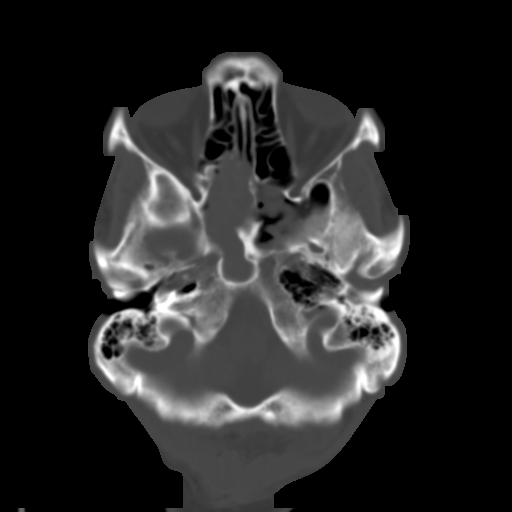
[im 9/33  brain]
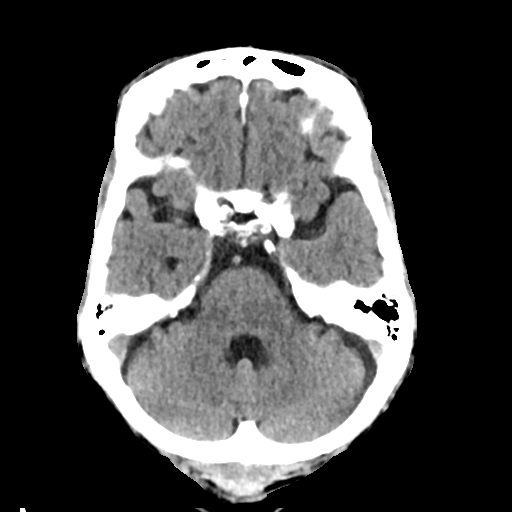
[im 13/33  brain]
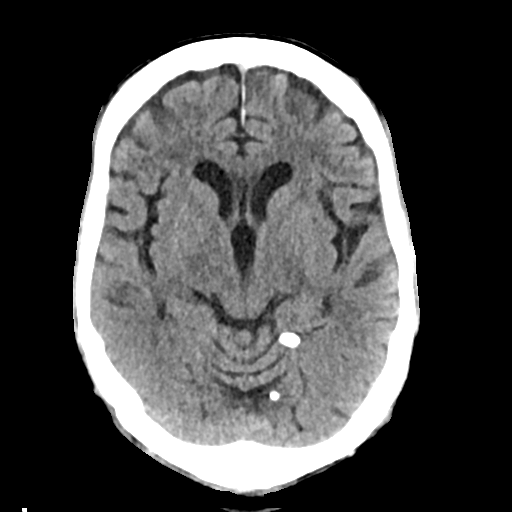
[im 17/33  brain]
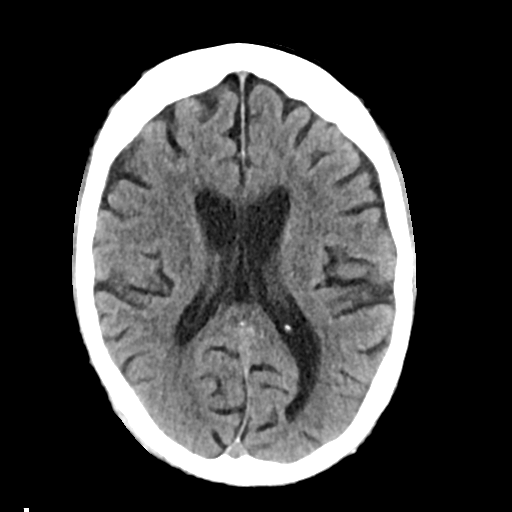
[im 21/33  brain]
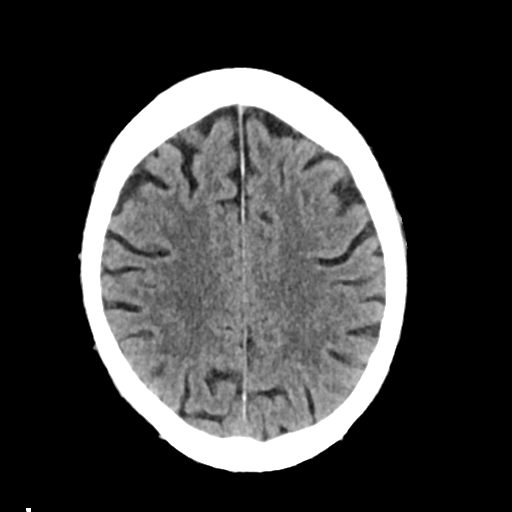
[im 21/33  bone]
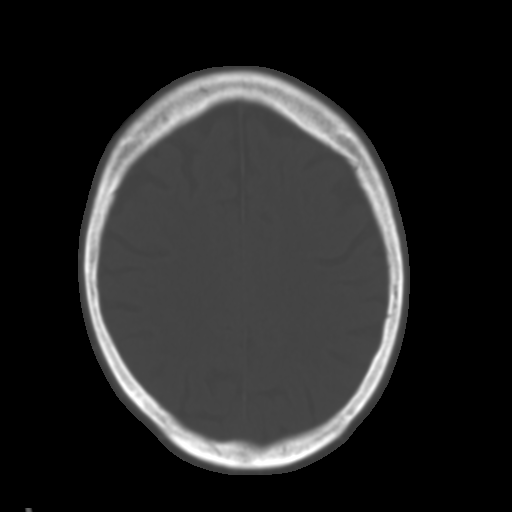
[im 25/33  brain]
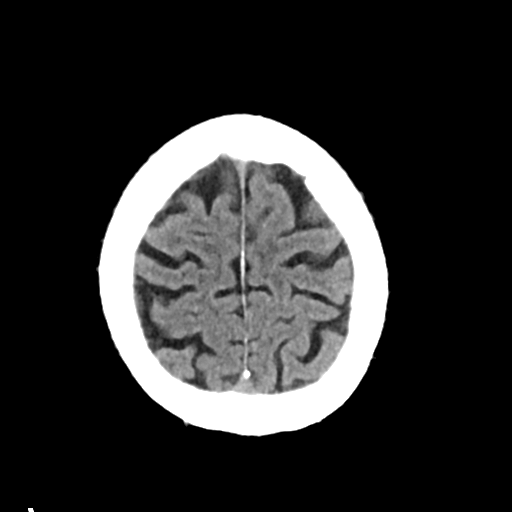
[im 29/33  brain]
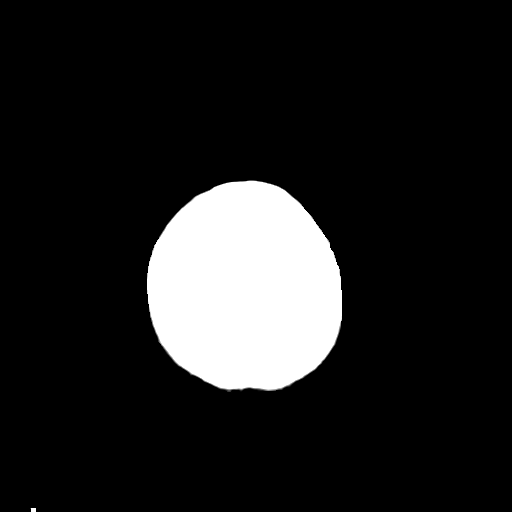

[Series 3: head bone · axial · 0.41mm/px · z∈[-109,-77]mm · 3 of 81 slices shown]
[im 9/81  bone]
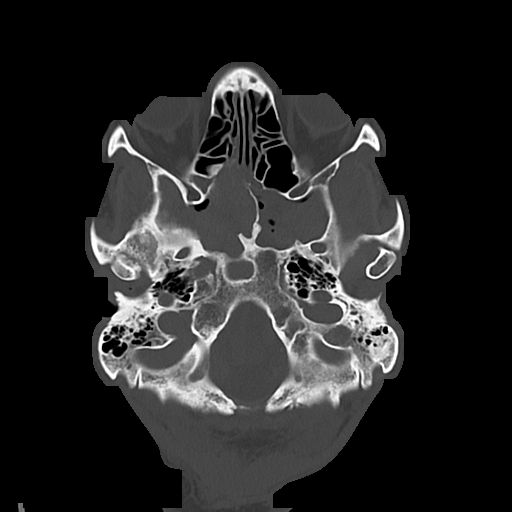
[im 17/81  bone]
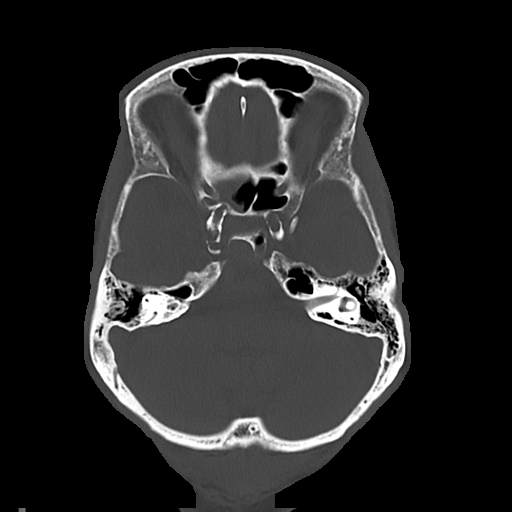
[im 25/81  bone]
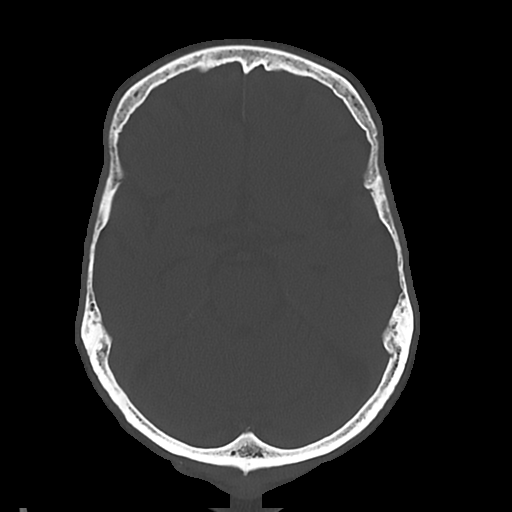

[Series 4: cor soft · coronal · 0.33mm/px · 3 of 66 slices shown]
[im 22/66  brain]
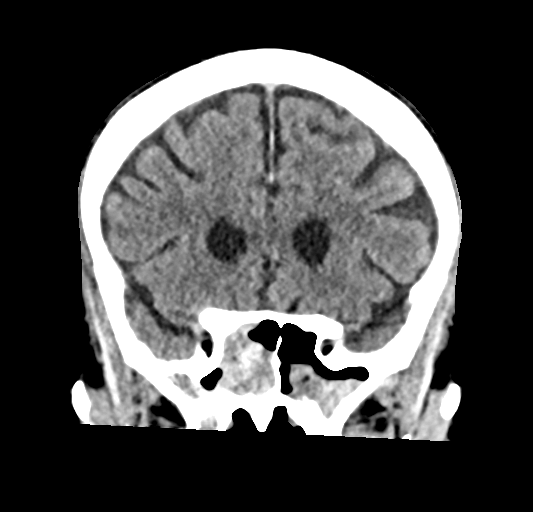
[im 29/66  brain]
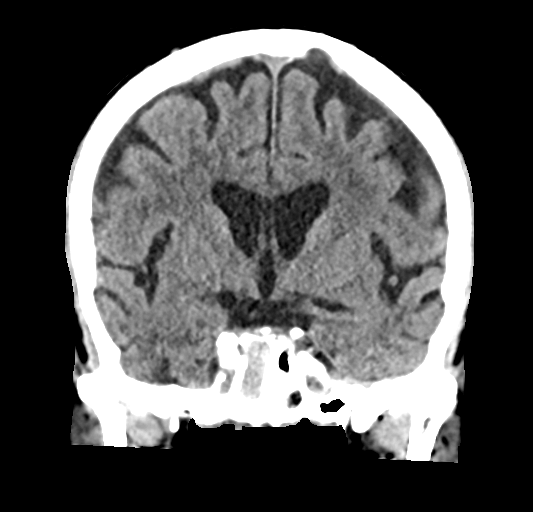
[im 37/66  brain]
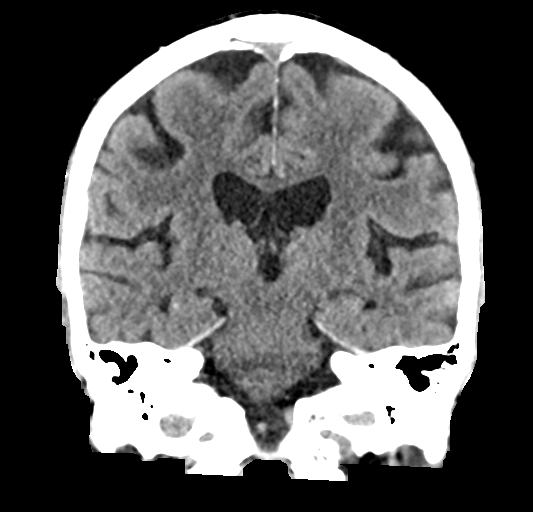

[Series 5: sag soft · sagittal · 0.33mm/px · 3 of 59 slices shown]
[im 20/59  brain]
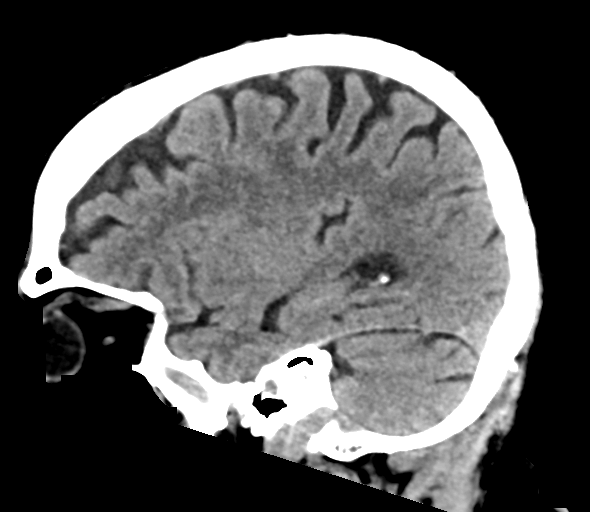
[im 30/59  brain]
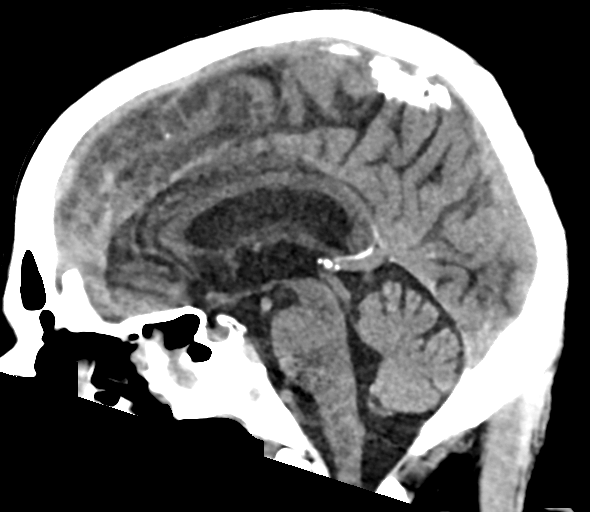
[im 39/59  brain]
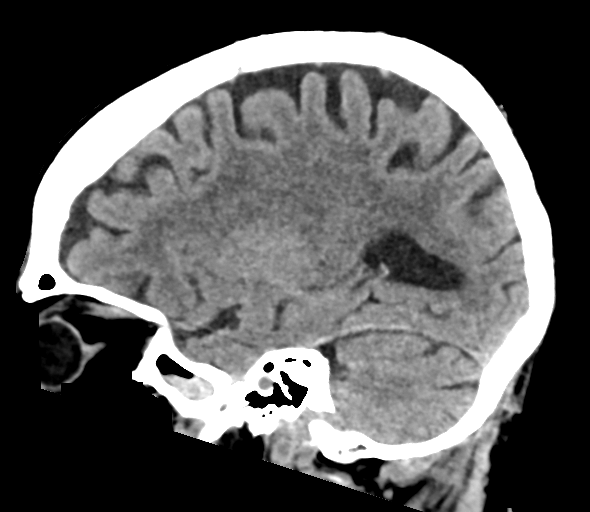

[16 of 47 positions shown; findings below may reference images not displayed]

BRAIN:
BRAIN
Cerebral ventricle sizes are concordant with the degree of cerebral
volume loss. Patchy and confluent areas of decreased attenuation are
noted throughout the deep and periventricular white matter of the
cerebral hemispheres bilaterally, compatible with chronic
microvascular ischemic disease.

No evidence of large-territorial acute infarction. No parenchymal
hemorrhage. No mass lesion. No extra-axial collection.

No mass effect or midline shift. No hydrocephalus. Cavum septum
pellucidum variant noted. Basilar cisterns are patent.

Vascular: No hyperdense vessel. Atherosclerotic calcifications are
present within the cavernous internal carotid arteries.

Skull: No acute fracture or focal lesion.

Sinuses/Orbits: Almost complete opacification of bilateral sphenoid
sinuses. Hyperdense debris noted within the sphenoid sinuses.
Paranasal sinuses and mastoid air cells are clear. Bilateral lens
replacement. The orbits are unremarkable.

Other: None.
IMPRESSION: 1. No acute intracranial abnormality.
2. Bilateral sphenoid sinus disease with etiology likely chronic
and/or fungal given appearance.

## 2021-11-01 MED ORDER — SODIUM CHLORIDE 0.9 % IV SOLN
2.0000 g | INTRAVENOUS | Status: DC
  Administered 2021-11-01: 2 g via INTRAVENOUS
  Filled 2021-11-01: qty 20

## 2021-11-01 MED ORDER — LACTATED RINGERS IV BOLUS (SEPSIS)
1000.0000 mL | Freq: Once | INTRAVENOUS | Status: AC
  Administered 2021-11-01: 1000 mL via INTRAVENOUS

## 2021-11-01 MED ORDER — CHLORHEXIDINE GLUCONATE CLOTH 2 % EX PADS
6.0000 | MEDICATED_PAD | Freq: Every day | CUTANEOUS | Status: DC
  Administered 2021-11-01 – 2021-12-04 (×32): 6 via TOPICAL

## 2021-11-01 MED ORDER — INSULIN ASPART 100 UNIT/ML IJ SOLN
0.0000 [IU] | INTRAMUSCULAR | Status: DC
  Administered 2021-11-02: 2 [IU] via SUBCUTANEOUS
  Administered 2021-11-02: 3 [IU] via SUBCUTANEOUS
  Administered 2021-11-02: 5 [IU] via SUBCUTANEOUS
  Administered 2021-11-02 (×2): 2 [IU] via SUBCUTANEOUS
  Administered 2021-11-02: 9 [IU] via SUBCUTANEOUS

## 2021-11-01 MED ORDER — SODIUM CHLORIDE 0.9 % IV BOLUS
1000.0000 mL | Freq: Once | INTRAVENOUS | Status: AC
  Administered 2021-11-01: 1000 mL via INTRAVENOUS

## 2021-11-01 MED ORDER — LACTATED RINGERS IV BOLUS
1000.0000 mL | Freq: Once | INTRAVENOUS | Status: AC
  Administered 2021-11-01: 1000 mL via INTRAVENOUS

## 2021-11-01 MED ORDER — METRONIDAZOLE 500 MG/100ML IV SOLN
500.0000 mg | Freq: Once | INTRAVENOUS | Status: AC
  Administered 2021-11-01: 500 mg via INTRAVENOUS
  Filled 2021-11-01: qty 100

## 2021-11-01 MED ORDER — NOREPINEPHRINE 4 MG/250ML-% IV SOLN
2.0000 ug/min | INTRAVENOUS | Status: DC
  Administered 2021-11-02: 5 ug/min via INTRAVENOUS
  Administered 2021-11-02: 4 ug/min via INTRAVENOUS
  Administered 2021-11-02: 10 ug/min via INTRAVENOUS
  Administered 2021-11-02: 7 ug/min via INTRAVENOUS
  Filled 2021-11-01 (×3): qty 250

## 2021-11-01 MED ORDER — NOREPINEPHRINE 4 MG/250ML-% IV SOLN
0.0000 ug/min | INTRAVENOUS | Status: DC
  Administered 2021-11-01: 2 ug/min via INTRAVENOUS
  Filled 2021-11-01: qty 250

## 2021-11-01 MED ORDER — LACTATED RINGERS IV SOLN: INTRAVENOUS | Status: DC

## 2021-11-01 MED ORDER — SODIUM CHLORIDE 0.9 % IV SOLN
250.0000 mL | INTRAVENOUS | Status: DC
  Administered 2021-11-02 (×2): 250 mL via INTRAVENOUS

## 2021-11-01 MED ORDER — FENTANYL CITRATE (PF) 100 MCG/2ML IJ SOLN
25.0000 ug | INTRAMUSCULAR | Status: DC | PRN
  Administered 2021-11-02: 25 ug via INTRAVENOUS
  Administered 2021-11-02: 12.5 ug via INTRAVENOUS
  Filled 2021-11-01 (×2): qty 2

## 2021-11-01 MED ORDER — VANCOMYCIN HCL 1500 MG/300ML IV SOLN
1500.0000 mg | Freq: Once | INTRAVENOUS | Status: AC
  Administered 2021-11-01: 1500 mg via INTRAVENOUS
  Filled 2021-11-01: qty 300

## 2021-11-01 MED ORDER — LACTATED RINGERS IV BOLUS (SEPSIS): 1000.0000 mL | Freq: Once | INTRAVENOUS | Status: DC

## 2021-11-01 MED ORDER — SODIUM CHLORIDE 0.9 % IV SOLN
500.0000 mg | INTRAVENOUS | Status: DC
  Administered 2021-11-01: 500 mg via INTRAVENOUS
  Filled 2021-11-01: qty 5

## 2021-11-01 NOTE — Progress Notes (Signed)
An USGPIV (ultrasound guided PIV) has been placed for short-term vasopressor infusion. A correctly placed ivWatch must be used when administering Vasopressors. Should this treatment be needed beyond 72 hours, central line access should be obtained.  It will be the responsibility of the bedside nurse to follow best practice to prevent extravasations.   ?

## 2021-11-01 NOTE — ED Triage Notes (Signed)
Pt to ED via [facility] from home for generalized weakness and concern for sepsis. Per EMS Pt had a fall last night at home and has not been acting himself since. Pt denies hitting his head. Pt states that he hit his lower back and is having pain in his buttock. Pt was hypotension with EMS, initial BP of 70/40, pt has pace marker. Pt is A & O x 4

## 2021-11-01 NOTE — H&P (Incomplete)
NAME:  Glenn [name], MRN:  [id number], DOB:  15-Oct-1940, LOS: 0 ADMISSION DATE:  10/24/2021, CONSULTATION DATE:  10/09/2021 REFERRING MD:  [facility] EDP, CHIEF COMPLAINT:  generally weak   History of Present Illness:  81 yo male with pmh of *** presented to [facility] after not feeling well and genrally weak. Pt had fallen a day prior and hit his back with buttock and lower back pain. He was found to be hypotensive and while fluid responsive initially, now req vasopressor support.   Pertinent  Medical History  ***  Significant Hospital Events: Including procedures, antibiotic start and stop dates in addition to other pertinent events   -5/31: transferred to [facility] from [facility] for MRI  Interim History / Subjective:  As above  Objective   Blood pressure 102/62, pulse 87, temperature 97.8 F (36.6 C), temperature source Oral, resp. rate 19, height _0  (1.778 m), weight 61.2 kg, SpO2 96 %.        Intake/Output Summary (Last 24 hours) at 10/26/2021 2200 Last data filed at 10/08/2021 2110 Gross per 24 hour  Intake 3329.68 ml  Output 1200 ml  Net 2129.68 ml   Filed Weights   10/31/2021 1623  Weight: 61.2 kg    Examination: General: *** HENT: *** Lungs: *** Cardiovascular: *** Abdomen: *** Extremities: *** Neuro: *** GU: ***  Resolved Hospital Problem list     Assessment & Plan:  Septic shock 2/2 pyelonephritis:  -titrate vasopressors to map >65 -cont abx -ua with cx pending as are blood cx -cont with gentle IVF -lactate thankfully only 1.7 Leukocytosis:  -24.7K -follow trend AKI 2/2 above:  -per osh pt had urinary retention that has been resolved hopefully this will help resolve aki Cr is already somewhat downtrending -last Cr in 2018 was 1.1 -follow renal indices and uop -no obstructing nephrolithiasis seen on ct  ? Osteomyelitis/discitis:  -pt transferred for this eval as at osh pt cannot receive mri with pacer in place -will order MRI here -cont abx as above -alk phos  is elevated as well   Transaminitis:  -noted likely 2/2 shock  -trend Coagulopathy:  -inr 3.1 -no overt bleeding.  -follow trend  Hyperglycemia:  -ssi -check a1c   Best Practice (right click and "Reselect all SmartList Selections" daily)   Diet/type: clear liquids DVT prophylaxis: SCD GI prophylaxis: N/A Lines: N/A Foley:  N/A Code Status:  full code Last date of multidisciplinary goals of care discussion [d/w pt today]  Labs   CBC: Recent Labs  Lab 10/27/2021 1628  WBC 24.7*  HGB 11.2*  HCT 34.2*  MCV 83.0  PLT 104*    Basic Metabolic Panel: Recent Labs  Lab 10/30/2021 1628 10/12/2021 1817  NA 129* 129*  K 3.9 3.8  CL 94* 97*  CO2 21* 19*  GLUCOSE 416* 379*  BUN 115* 116*  CREATININE 3.62* 3.16*  CALCIUM 8.2* 8.0*   GFR: Estimated Creatinine Clearance: 16.7 mL/min (A) (by C-G formula based on SCr of 3.16 mg/dL (H)). Recent Labs  Lab 10/22/2021 1628 10/11/2021 1817  WBC 24.7*  --   LATICACIDVEN  --  1.7    Liver Function Tests: Recent Labs  Lab 10/06/2021 1817  AST 74*  ALT 49*  ALKPHOS 164*  BILITOT 1.4*  PROT 5.8*  ALBUMIN 2.6*   No results for input(s): LIPASE, AMYLASE in the last 168 hours. No results for input(s): AMMONIA in the last 168 hours.  ABG    Component Value Date/Time   PHART 7.37 12/22/2014 2215  PCO2ART 35 12/22/2014 2215   PO2ART 98 12/22/2014 2215   HCO3 17.9 (L) 10/23/2021 1817   ACIDBASEDEF 8.7 (H) 10/20/2021 1817   O2SAT 82 10/11/2021 1817     Coagulation Profile: Recent Labs  Lab 10/03/2021 1817  INR 3.1*    Cardiac Enzymes: No results for input(s): CKTOTAL, CKMB, CKMBINDEX, TROPONINI in the last 168 hours.  HbA1C: No results found for: HGBA1C  CBG: Recent Labs  Lab 10/12/2021 2015  GLUCAP 392*    Review of Systems:   Per HPI  Past Medical History:  He,  has a past medical history of Diabetes mellitus without complication ([place]), Exposure to Agent Orange, Hypertension, MI (myocardial infarction)  ([place]), and Renal disorder.   Surgical History:   Past Surgical History:  Procedure Laterality Date   BIOPSY PROSTATE     CORONARY ARTERY BYPASS GRAFT     x 4   KIDNEY STONE SURGERY       Social History:   reports that he has quit smoking. He has never used smokeless tobacco. He reports current alcohol use.   Family History:  His family history is not on file.   Allergies No Known Allergies   Home Medications  Prior to Admission medications   Medication Sig Start Date End Date Taking? Authorizing Provider  cephALEXin (KEFLEX) 500 MG capsule Take 1 capsule (500 mg total) by mouth 2 (two) times daily. 09/04/16   Harvest Dark, MD  furosemide (LASIX) 20 MG tablet Take 1 tablet (20 mg total) by mouth daily. 09/04/16 09/14/16  Harvest Dark, MD  insulin NPH-regular Human (NOVOLIN 70/30) (70-30) 100 UNIT/ML injection Inject 25 Units into the skin daily.    [provider]     Critical care time: ***

## 2021-11-01 NOTE — ED Notes (Signed)
Called [facility] transfer Ander Slade) for Roxan Hockey, MD

## 2021-11-01 NOTE — ED Provider Notes (Signed)
[facility] Provider Note    Event Date/Time   First MD Initiated Contact with Patient 10/06/2021 1646     (approximate)   History   Hypotension and Weakness   HPI  Glenn [name] is a 81 y.o. male extensive past medical history controlled diabetes on Xarelto for history of A-fib presents to the ER for evaluation of weakness after fall yesterday.  Complaining of moderate to severe low back pain.  Thinks he did hit his head.  Has had some confusion.  Also having some left leg pain.  Has had some chills but no measured temperature.  Glucose has been running high.  Has had poor p.o. intake.     Physical Exam   Triage Vital Signs: ED Triage Vitals  Enc Vitals Group     BP 10/19/2021 1622 (!) 81/47     Pulse Rate 10/24/2021 1622 89     Resp 10/04/2021 1622 (!) 25     Temp 10/22/2021 1622 97.8 F (36.6 C)     Temp Source 10/03/2021 1622 Oral     SpO2 10/09/2021 1622 96 %     Weight 10/03/2021 1623 135 lb (61.2 kg)     Height 10/20/2021 1623 5\' 10"  (1.778 m)     Head Circumference --      Peak Flow --      Pain Score 10/09/2021 1622 10     Pain Loc --      Pain Edu? --      Excl. in [place]? --     Most recent vital signs: Vitals:   10/05/2021 2145 10/03/2021 2215  BP: 90/60 92/61  Pulse: 86 86  Resp: 17 18  Temp:    SpO2: 96% 97%     Constitutional: Alert frail appearing Eyes: Conjunctivae are normal.  Head: Atraumatic. Nose: No congestion/rhinnorhea. Mouth/Throat: Mucous membranes are moist.   Neck: Painless ROM.  Cardiovascular:   Good peripheral circulation. No m/g/r Respiratory: Normal respiratory effort.  No retractions.  Gastrointestinal: Soft and nontender.  Bilat cva ttp Musculoskeletal:  ble edema, blood blister to left lateral foot, no warmth Neurologic:  MAE spontaneously. No gross focal neurologic deficits are appreciated.  Skin:  Skin is warm, dry and intact. No rash noted. Psychiatric: Mood and affect are normal. Speech and behavior are  normal.    ED Results / Procedures / Treatments   Labs (all labs ordered are listed, but only abnormal results are displayed) Labs Reviewed  BASIC METABOLIC PANEL - Abnormal; Notable for the following components:      Result Value   Sodium 129 (*)    Chloride 94 (*)    CO2 21 (*)    Glucose, Bld 416 (*)    BUN 115 (*)    Creatinine, Ser 3.62 (*)    Calcium 8.2 (*)    GFR, Estimated 16 (*)    All other components within normal limits  CBC - Abnormal; Notable for the following components:   WBC 24.7 (*)    RBC 4.12 (*)    Hemoglobin 11.2 (*)    HCT 34.2 (*)    Platelets 104 (*)    All other components within normal limits  COMPREHENSIVE METABOLIC PANEL - Abnormal; Notable for the following components:   Sodium 129 (*)    Chloride 97 (*)    CO2 19 (*)    Glucose, Bld 379 (*)    BUN 116 (*)    Creatinine, Ser 3.16 (*)    Calcium 8.0 (*)  Total Protein 5.8 (*)    Albumin 2.6 (*)    AST 74 (*)    ALT 49 (*)    Alkaline Phosphatase 164 (*)    Total Bilirubin 1.4 (*)    GFR, Estimated 19 (*)    All other components within normal limits  PROTIME-INR - Abnormal; Notable for the following components:   Prothrombin Time 31.5 (*)    INR 3.1 (*)    All other components within normal limits  APTT - Abnormal; Notable for the following components:   aPTT 46 (*)    All other components within normal limits  URINALYSIS, COMPLETE (UACMP) WITH MICROSCOPIC - Abnormal; Notable for the following components:   Color, Urine YELLOW (*)    APPearance TURBID (*)    Glucose, UA >=500 (*)    Hgb urine dipstick LARGE (*)    Protein, ur 30 (*)    Leukocytes,Ua LARGE (*)    WBC, UA >50 (*)    Bacteria, UA MANY (*)    All other components within normal limits  BLOOD GAS, VENOUS - Abnormal; Notable for the following components:   pCO2, Ven 40 (*)    pO2, Ven 54 (*)    Bicarbonate 17.9 (*)    Acid-base deficit 8.7 (*)    All other components within normal limits  CBG MONITORING, ED -  Abnormal; Notable for the following components:   Glucose-Capillary 392 (*)    All other components within normal limits  RESP PANEL BY RT-PCR (FLU A&B, COVID) ARPGX2  CULTURE, BLOOD (ROUTINE X 2)  CULTURE, BLOOD (ROUTINE X 2)  URINE CULTURE  LACTIC ACID, PLASMA  LACTIC ACID, PLASMA     EKG  ED ECG REPORT I, Merlyn Lot, the attending physician, personally viewed and interpreted this ECG.   Date: 10/04/2021  EKG Time: 16:17  Rate: 90  Rhythm: sinus  Axis: normal  Intervals:   borderline prolonged qt  ST&T Change: no stemi, no depressions, poor r wave progression    RADIOLOGY Please see ED Course for my review and interpretation.  I personally reviewed all radiographic images ordered to evaluate for the above acute complaints and reviewed radiology reports and findings.  These findings were personally discussed with the patient.  Please see medical record for radiology report.    PROCEDURES:  Critical Care performed: Yes, see critical care procedure note(s)  .Critical Care Performed by: Merlyn Lot, MD Authorized by: Merlyn Lot, MD   Critical care provider statement:    Critical care time (minutes):  40   Critical care was necessary to treat or prevent imminent or life-threatening deterioration of the following conditions:  Circulatory failure   Critical care was time spent personally by me on the following activities:  Ordering and performing treatments and interventions, ordering and review of laboratory studies, ordering and review of radiographic studies, pulse oximetry, re-evaluation of patient's condition, review of old charts, obtaining history from patient or surrogate, examination of patient, evaluation of patient's response to treatment, discussions with primary provider, discussions with consultants and development of treatment plan with patient or surrogate   MEDICATIONS ORDERED IN ED: Medications  cefTRIAXone (ROCEPHIN) 2 g in sodium  chloride 0.9 % 100 mL IVPB (0 g Intravenous Stopped 10/14/2021 1817)  azithromycin (ZITHROMAX) 500 mg in sodium chloride 0.9 % 250 mL IVPB (0 mg Intravenous Stopped 10/21/2021 1947)  norepinephrine (LEVOPHED) 4mg  in 235mL (0.016 mg/mL) premix infusion (2 mcg/min Intravenous New Bag/Given 10/30/2021 1953)  vancomycin (VANCOREADY) IVPB 1500 mg/300 mL (1,500  mg Intravenous New Bag/Given 10/03/2021 2058)  lactated ringers infusion ( Intravenous New Bag/Given 10/31/2021 2100)  lactated ringers bolus 1,000 mL (0 mLs Intravenous Stopped 10/18/2021 1817)  sodium chloride 0.9 % bolus 1,000 mL (0 mLs Intravenous Stopped 10/31/2021 1817)  lactated ringers bolus 1,000 mL (0 mLs Intravenous Stopped 10/09/2021 2009)  metroNIDAZOLE (FLAGYL) IVPB 500 mg (0 mg Intravenous Stopped 10/28/2021 2110)     IMPRESSION / MDM / ASSESSMENT AND PLAN / ED COURSE  I reviewed the triage vital signs and the nursing notes.                              Differential diagnosis includes, but is not limited to, sepsis, electrolyte abnormality, dehydration, AKI, pneumonia, UTI, fracture, contusion, hematoma   Patient ill-appearing hypotensive febrile presentation concerning for sepsis.  This presenting complaint could reflect a potentially life-threatening illness therefore the patient will be placed on continuous pulse oximetry and telemetry for monitoring.  Laboratory evaluation will be sent to evaluate for the above complaints.      Clinical Course as of 10/11/2021 2241  Wed Nov 01, 2021  1804 Blood pressure is improving with IV hydration.  Urinalysis is consistent with gross pyuria.  He is receiving IV Rocephin for presumed sepsis.  Patient with evidence of significant AKI [PR]  1805  and dehydration with hyperglycemia but normal anion gap. [PR]  [id number] CT head on my interpretation shows no evidence of acute bleed [PR]  1806 CT abdomen pelvis on my interpretation shows evidence of significant bladder distention.  Will place indwelling Foley catheter.  [PR]  1918 Discussed case in consultation with Dr. Cari Caraway neurosurgery who does recommend MRI given findings concerning for osteomyelitis, discitis.  His presentation is further complicated by the fact that he is on anticoagulation his INR is elevated to 3.  He also has a cardiac implantable device [PR]  1919  limiting our ability to have MRI performed at this facility. [PR]  1928 Unable to get CT myelogram performed here until tomorrow.  Patient with still soft blood pressures but mentating well is getting third liter of IV fluids.  Will be placed on nor epi for blood pressure support.  Will consult [facility] for transfer. [PR]  2101 Have repaged at [facility].  Still awaiting callback.  Gaspar Cola is on diversion. [PR]  2146 Consult with critical care at [facility] who is currently accepted patient to their service.  Patient reassessed is mentating well blood pressure improved does appear stable and appropriate for transfer. [PR]    Clinical Course User Index [PR] Merlyn Lot, MD    Patient's presentation is most consistent with acute presentation with potential threat to life or bodily function.   FINAL CLINICAL IMPRESSION(S) / ED DIAGNOSES   Final diagnoses:  Sepsis with acute renal failure, due to unspecified organism, unspecified acute renal failure type, unspecified whether septic shock present [facility])  Bladder outlet obstruction  Pyelonephritis     Rx / DC Orders   ED Discharge Orders     None        Note:  This document was prepared using Dragon voice recognition software and may include unintentional dictation errors.    Merlyn Lot, MD 10/13/2021 2241

## 2021-11-01 NOTE — Sepsis Progress Note (Signed)
Per secure chat with bedside RN, blood cultures were drawn at 1628, PRIOR to antibiotics given.

## 2021-11-01 NOTE — Consult Note (Signed)
PHARMACY -  BRIEF ANTIBIOTIC NOTE   Pharmacy has received consult(s) for vancomycin from an ED provider.  The patient's profile has been reviewed for ht/wt/allergies/indication/available labs.    One time order(s) placed for  Vancomycin 1500 mg x 1   Further antibiotics/pharmacy consults should be ordered by admitting physician if indicated.                       Thank you, Sharen Hones, PharmD, BCPS Clinical Pharmacist   10/11/2021  8:02 PM

## 2021-11-01 NOTE — ED Notes (Signed)
Pt accepted at [facility] and to be transported by [facility].  Notified [facility] (Sonya) to remove pt. from waitlist

## 2021-11-01 NOTE — Sepsis Progress Note (Signed)
[facility]Link monitoring code sepsis.  

## 2021-11-01 NOTE — Consult Note (Signed)
CODE SEPSIS - PHARMACY COMMUNICATION  **Broad Spectrum Antibiotics should be administered within 1 hour of Sepsis diagnosis**  Time Code Sepsis Called/Page Received: 1645  Antibiotics Ordered: 1645  Time of 1st antibiotic administration: 1705  Additional action taken by pharmacy: n/a  If necessary, Name of Provider/Nurse Contacted: n/a    Derrek Gu ,PharmD Clinical Pharmacist  10/15/2021  4:46 PM

## 2021-11-01 NOTE — ED Notes (Signed)
Patient transported to CT 

## 2021-11-01 NOTE — ED Notes (Signed)
[facility](Joy ) returned call they have no ccu available, can access again in the AM

## 2021-11-02 ENCOUNTER — Inpatient Hospital Stay (HOSPITAL_COMMUNITY): Payer: Medicare Other

## 2021-11-02 DIAGNOSIS — B961 Klebsiella pneumoniae [K. pneumoniae] as the cause of diseases classified elsewhere: Secondary | ICD-10-CM

## 2021-11-02 DIAGNOSIS — N320 Bladder-neck obstruction: Secondary | ICD-10-CM | POA: Diagnosis not present

## 2021-11-02 DIAGNOSIS — E1165 Type 2 diabetes mellitus with hyperglycemia: Secondary | ICD-10-CM

## 2021-11-02 DIAGNOSIS — A419 Sepsis, unspecified organism: Principal | ICD-10-CM | POA: Diagnosis present

## 2021-11-02 DIAGNOSIS — Z950 Presence of cardiac pacemaker: Secondary | ICD-10-CM

## 2021-11-02 DIAGNOSIS — R7881 Bacteremia: Secondary | ICD-10-CM

## 2021-11-02 DIAGNOSIS — R6521 Severe sepsis with septic shock: Secondary | ICD-10-CM | POA: Diagnosis not present

## 2021-11-02 DIAGNOSIS — N179 Acute kidney failure, unspecified: Secondary | ICD-10-CM

## 2021-11-02 DIAGNOSIS — R778 Other specified abnormalities of plasma proteins: Secondary | ICD-10-CM | POA: Diagnosis not present

## 2021-11-02 LAB — BASIC METABOLIC PANEL
Anion gap: 10 (ref 5–15)
BUN: 108 mg/dL — ABNORMAL HIGH (ref 8–23)
CO2: 20 mmol/L — ABNORMAL LOW (ref 22–32)
Calcium: 8.1 mg/dL — ABNORMAL LOW (ref 8.9–10.3)
Chloride: 101 mmol/L (ref 98–111)
Creatinine, Ser: 2.56 mg/dL — ABNORMAL HIGH (ref 0.61–1.24)
GFR, Estimated: 25 mL/min — ABNORMAL LOW (ref >60)
Glucose, Bld: 136 mg/dL — ABNORMAL HIGH (ref 70–99)
Potassium: 3.8 mmol/L (ref 3.5–5.1)
Sodium: 131 mmol/L — ABNORMAL LOW (ref 135–145)

## 2021-11-02 LAB — COMPREHENSIVE METABOLIC PANEL
ALT: 80 U/L — ABNORMAL HIGH (ref 0–44)
AST: 106 U/L — ABNORMAL HIGH (ref 15–41)
Albumin: 2.5 g/dL — ABNORMAL LOW (ref 3.5–5.0)
Alkaline Phosphatase: 216 U/L — ABNORMAL HIGH (ref 38–126)
Anion gap: 14 (ref 5–15)
BUN: 114 mg/dL — ABNORMAL HIGH (ref 8–23)
CO2: 19 mmol/L — ABNORMAL LOW (ref 22–32)
Calcium: 8.3 mg/dL — ABNORMAL LOW (ref 8.9–10.3)
Chloride: 97 mmol/L — ABNORMAL LOW (ref 98–111)
Creatinine, Ser: 3.1 mg/dL — ABNORMAL HIGH (ref 0.61–1.24)
GFR, Estimated: 20 mL/min — ABNORMAL LOW (ref >60)
Glucose, Bld: 344 mg/dL — ABNORMAL HIGH (ref 70–99)
Potassium: 4 mmol/L (ref 3.5–5.1)
Sodium: 130 mmol/L — ABNORMAL LOW (ref 135–145)
Total Bilirubin: 1 mg/dL (ref 0.3–1.2)
Total Protein: 6.1 g/dL — ABNORMAL LOW (ref 6.5–8.1)

## 2021-11-02 LAB — ECHOCARDIOGRAM COMPLETE
AR max vel: 2.34 cm2
AV Area VTI: 2.26 cm2
AV Area mean vel: 2.15 cm2
AV Mean grad: 2 mmHg
AV Peak grad: 3.6 mmHg
Ao pk vel: 0.95 m/s
Area-P 1/2: 8.25 cm2
Calc EF: 17.2 %
MV M vel: 3.67 m/s
MV Peak grad: 53.9 mmHg
Radius: 0.4 cm
S' Lateral: 4.8 cm
Single Plane A2C EF: 15.7 %
Single Plane A4C EF: 20.4 %

## 2021-11-02 LAB — LACTIC ACID, PLASMA: Lactic Acid, Venous: 1.7 mmol/L (ref 0.5–1.9)

## 2021-11-02 LAB — URINE CULTURE

## 2021-11-02 LAB — CBC
HCT: 38.4 % — ABNORMAL LOW (ref 39.0–52.0)
Hemoglobin: 12.9 g/dL — ABNORMAL LOW (ref 13.0–17.0)
MCH: 28.3 pg (ref 26.0–34.0)
MCHC: 33.6 g/dL (ref 30.0–36.0)
MCV: 84.2 fL (ref 80.0–100.0)
Platelets: 89 10*3/uL — ABNORMAL LOW (ref 150–400)
RBC: 4.56 MIL/uL (ref 4.22–5.81)
RDW: 15.5 % (ref 11.5–15.5)
WBC: 24.6 10*3/uL — ABNORMAL HIGH (ref 4.0–10.5)
nRBC: 0 % (ref 0.0–0.2)

## 2021-11-02 LAB — BLOOD CULTURE ID PANEL (REFLEXED) - BCID2

## 2021-11-02 LAB — GLUCOSE, CAPILLARY
Glucose-Capillary: 261 mg/dL — ABNORMAL HIGH (ref 70–99)
Glucose-Capillary: 198 mg/dL — ABNORMAL HIGH (ref 70–99)
Glucose-Capillary: 219 mg/dL — ABNORMAL HIGH (ref 70–99)
Glucose-Capillary: 194 mg/dL — ABNORMAL HIGH (ref 70–99)
Glucose-Capillary: 191 mg/dL — ABNORMAL HIGH (ref 70–99)
Glucose-Capillary: 125 mg/dL — ABNORMAL HIGH (ref 70–99)

## 2021-11-02 LAB — HEMOGLOBIN A1C
Hgb A1c MFr Bld: 10.6 % — ABNORMAL HIGH (ref 4.8–5.6)
Mean Plasma Glucose: 257.52 mg/dL

## 2021-11-02 LAB — TROPONIN I (HIGH SENSITIVITY)
Troponin I (High Sensitivity): 83 ng/L — ABNORMAL HIGH (ref <18)
Troponin I (High Sensitivity): 83 ng/L — ABNORMAL HIGH (ref <18)

## 2021-11-02 LAB — MAGNESIUM: Magnesium: 2.1 mg/dL (ref 1.7–2.4)

## 2021-11-02 LAB — PROTIME-INR
INR: 2.6 — ABNORMAL HIGH (ref 0.8–1.2)
Prothrombin Time: 27.8 seconds — ABNORMAL HIGH (ref 11.4–15.2)

## 2021-11-02 LAB — MRSA NEXT GEN BY PCR, NASAL: MRSA by PCR Next Gen: NOT DETECTED

## 2021-11-02 IMAGING — US US RENAL
1 series · 14 of 25 positions shown · non-contrast
Comparison: [DATE].

CLINICAL DATA: Acute kidney injury.

EXAM:
RENAL / URINARY TRACT ULTRASOUND COMPLETE

[Series 1: us renal · 71 acquisitions, 14 frames shown]
[im 1/71]
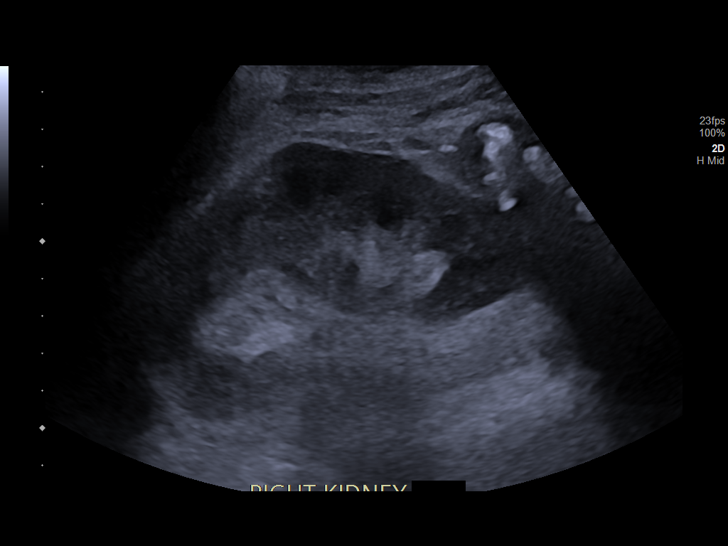
[im 6/71]
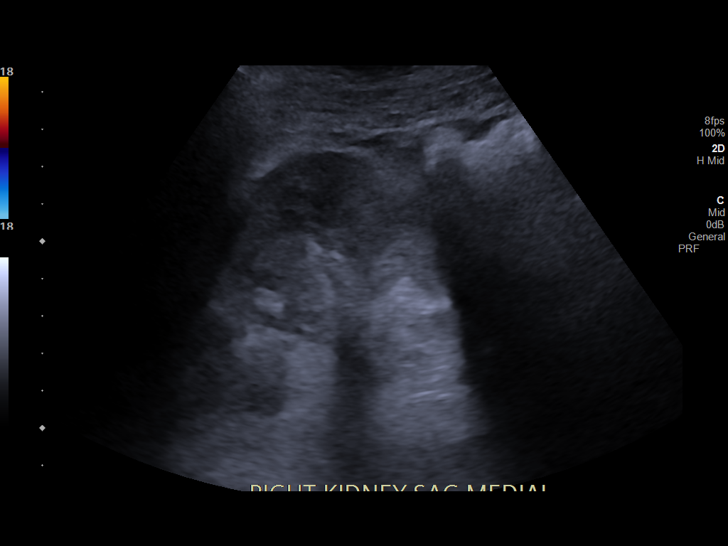
[im 12/71]
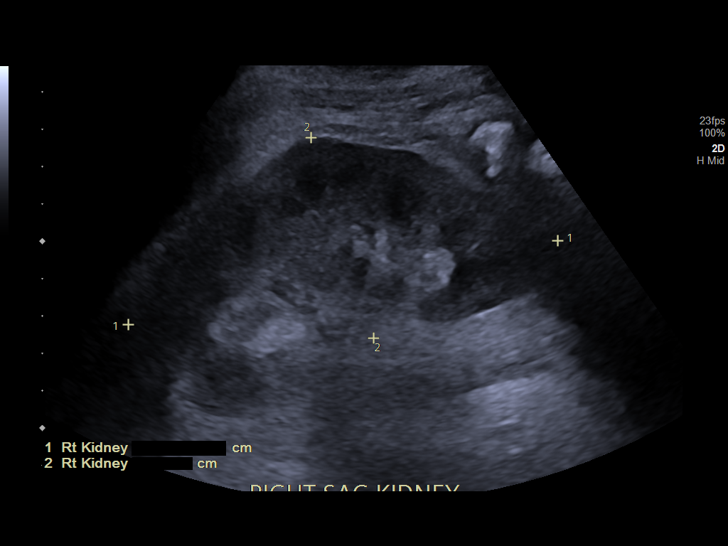
[im 18/71]
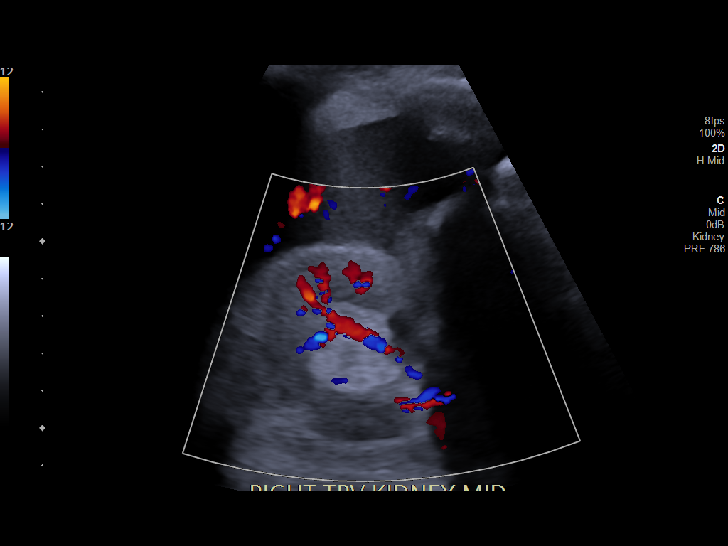
[im 24/71]
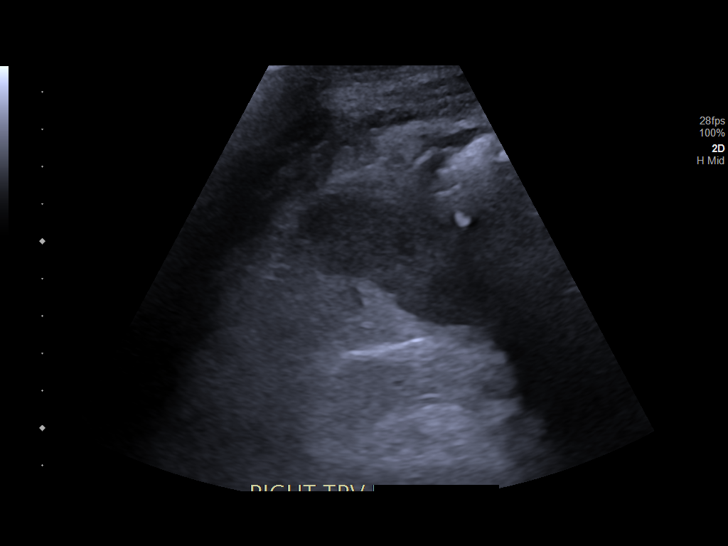
[im 27/71]
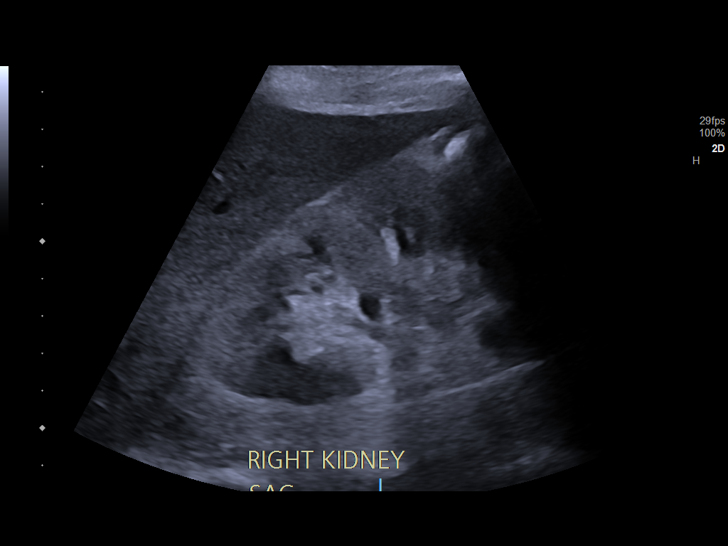
[im 33/71]
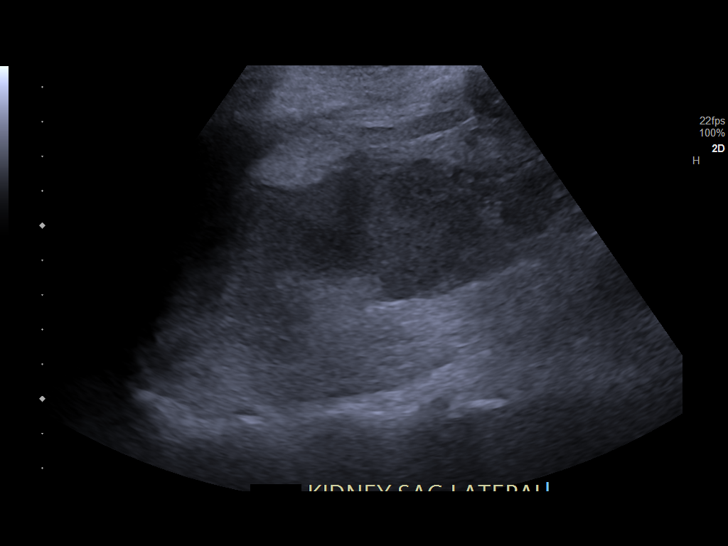
[im 38/71]
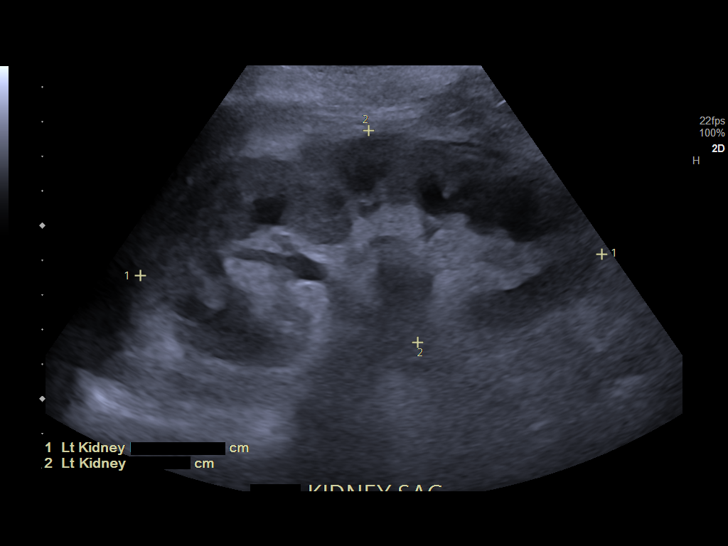
[im 44/71]
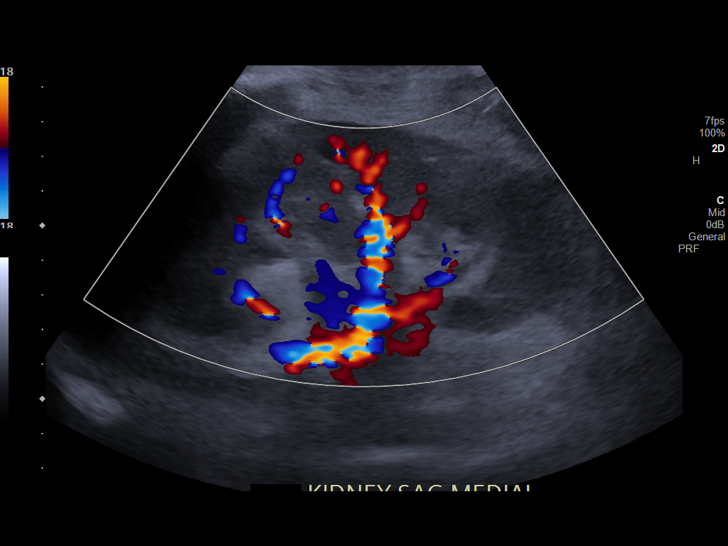
[im 47/71]
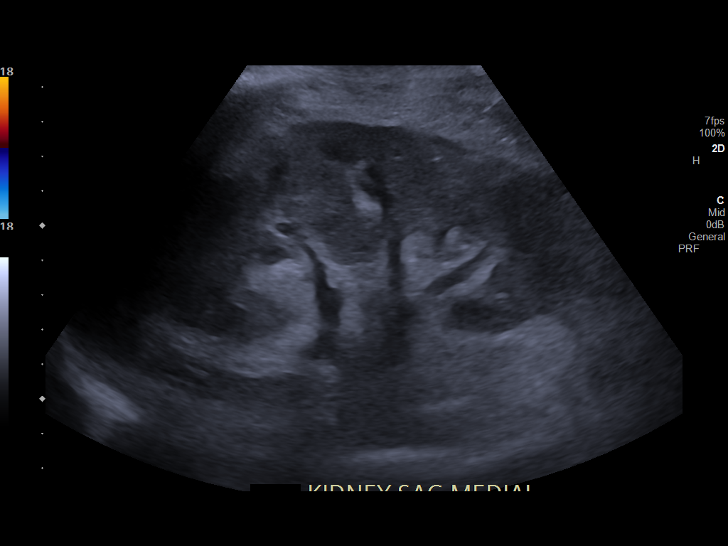
[im 53/71]
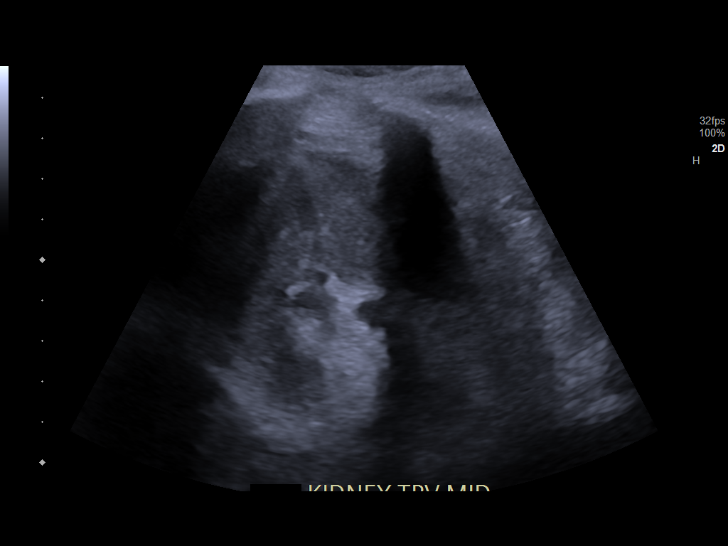
[im 59/71]
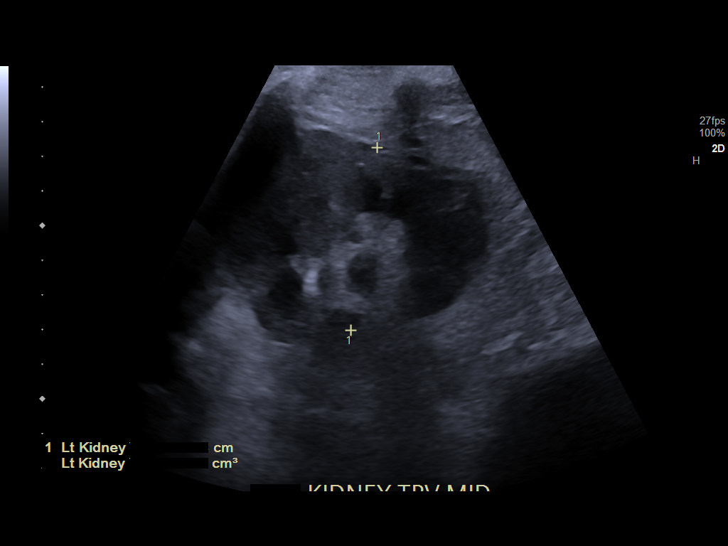
[im 65/71]
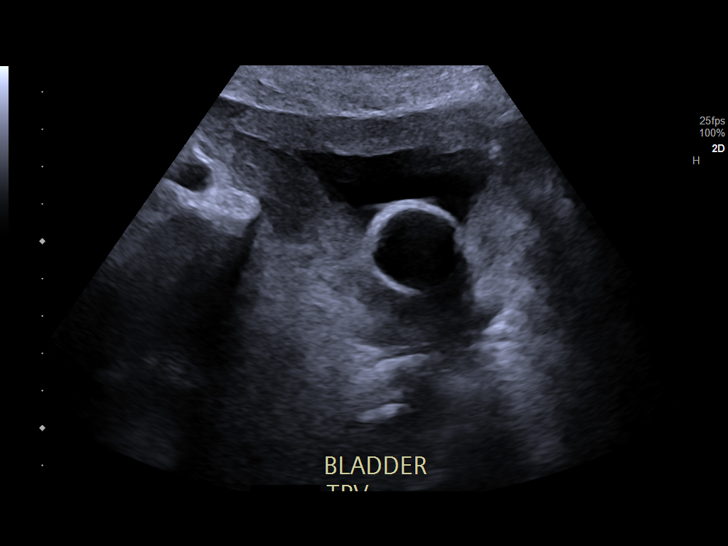
[im 71/71]
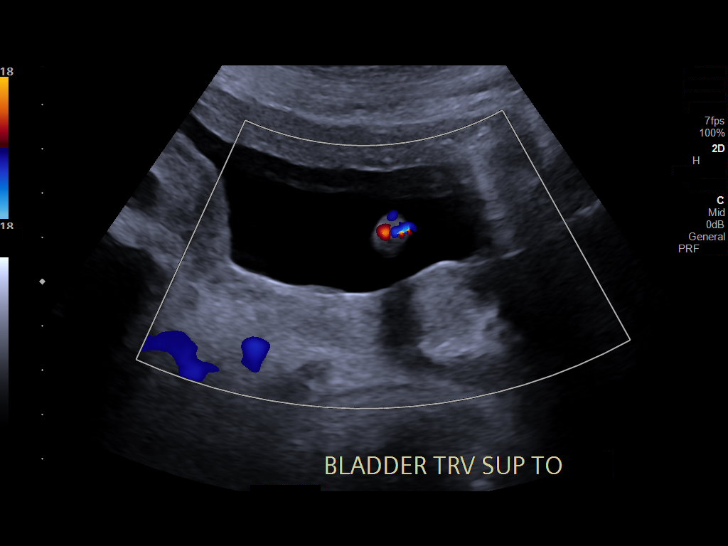

[14 of 25 positions shown; findings below may reference images not displayed]

FINDINGS: Right Kidney:

Renal measurements: 11.7 x 5.6 x 4.4 cm = volume: 150 mL. Increased
echogenicity of renal parenchyma is noted. No mass or hydronephrosis
visualized.

Left Kidney:

Renal measurements: 13.3 x 6.3 x 5.3 cm = volume: 234 mL. Increased
echogenicity of renal parenchyma is noted. 1 cm simple cyst is noted
which requires no further follow-up. No mass or hydronephrosis
visualized.

Bladder:

Foley catheter is noted. Moderate bladder wall thickening is noted
which most likely is due to lack of distension. No ureteral jets are
visualized.

Other:

Minimal amount of free fluid is noted around the liver.
IMPRESSION: Increased echogenicity of renal parenchyma is noted suggesting
medical renal disease. No hydronephrosis or renal obstruction is
noted.

Minimal ascites is noted around the liver.

## 2021-11-02 IMAGING — DX DG CHEST 1V PORT
1 series · 2 of 2 positions shown · non-contrast
Comparison: [DATE]

CLINICAL DATA: Central line placement

EXAM:
PORTABLE CHEST 1 VIEW

[Series 1: chest · 0.14mm/px · 2 of 2 slices shown]
[im 1/2]
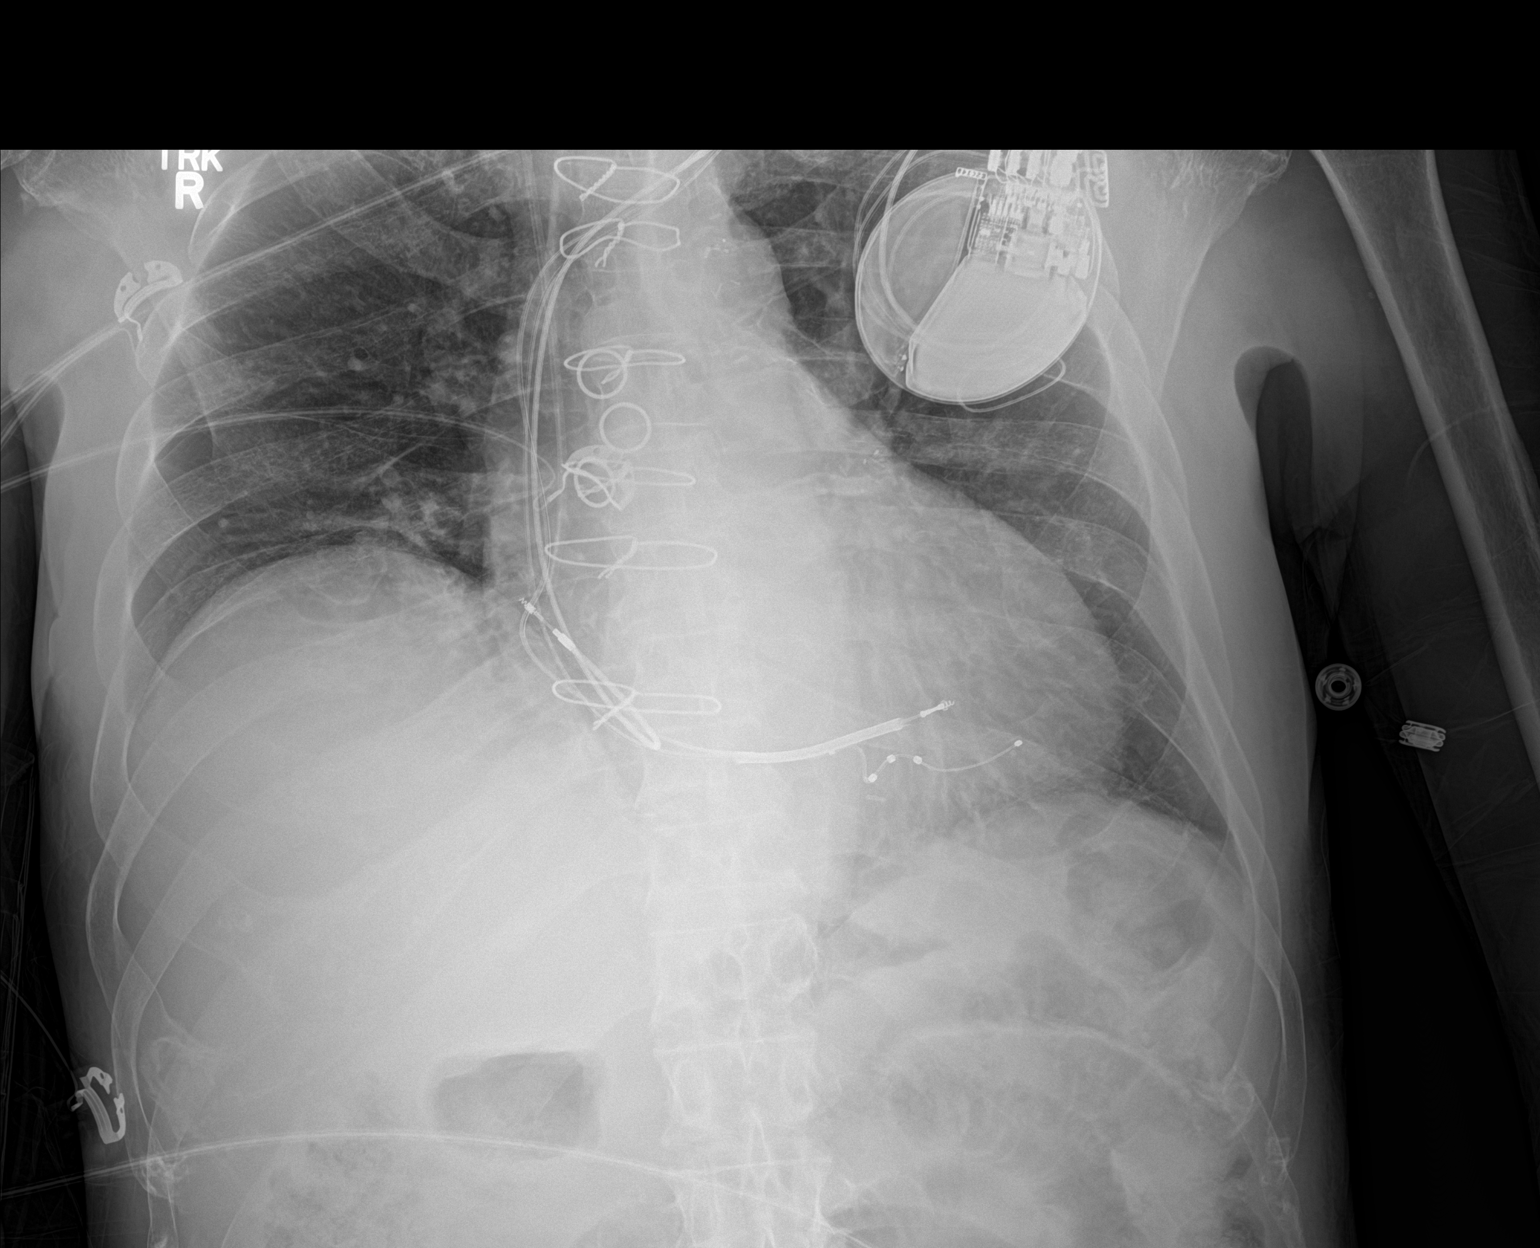
[im 2/2]
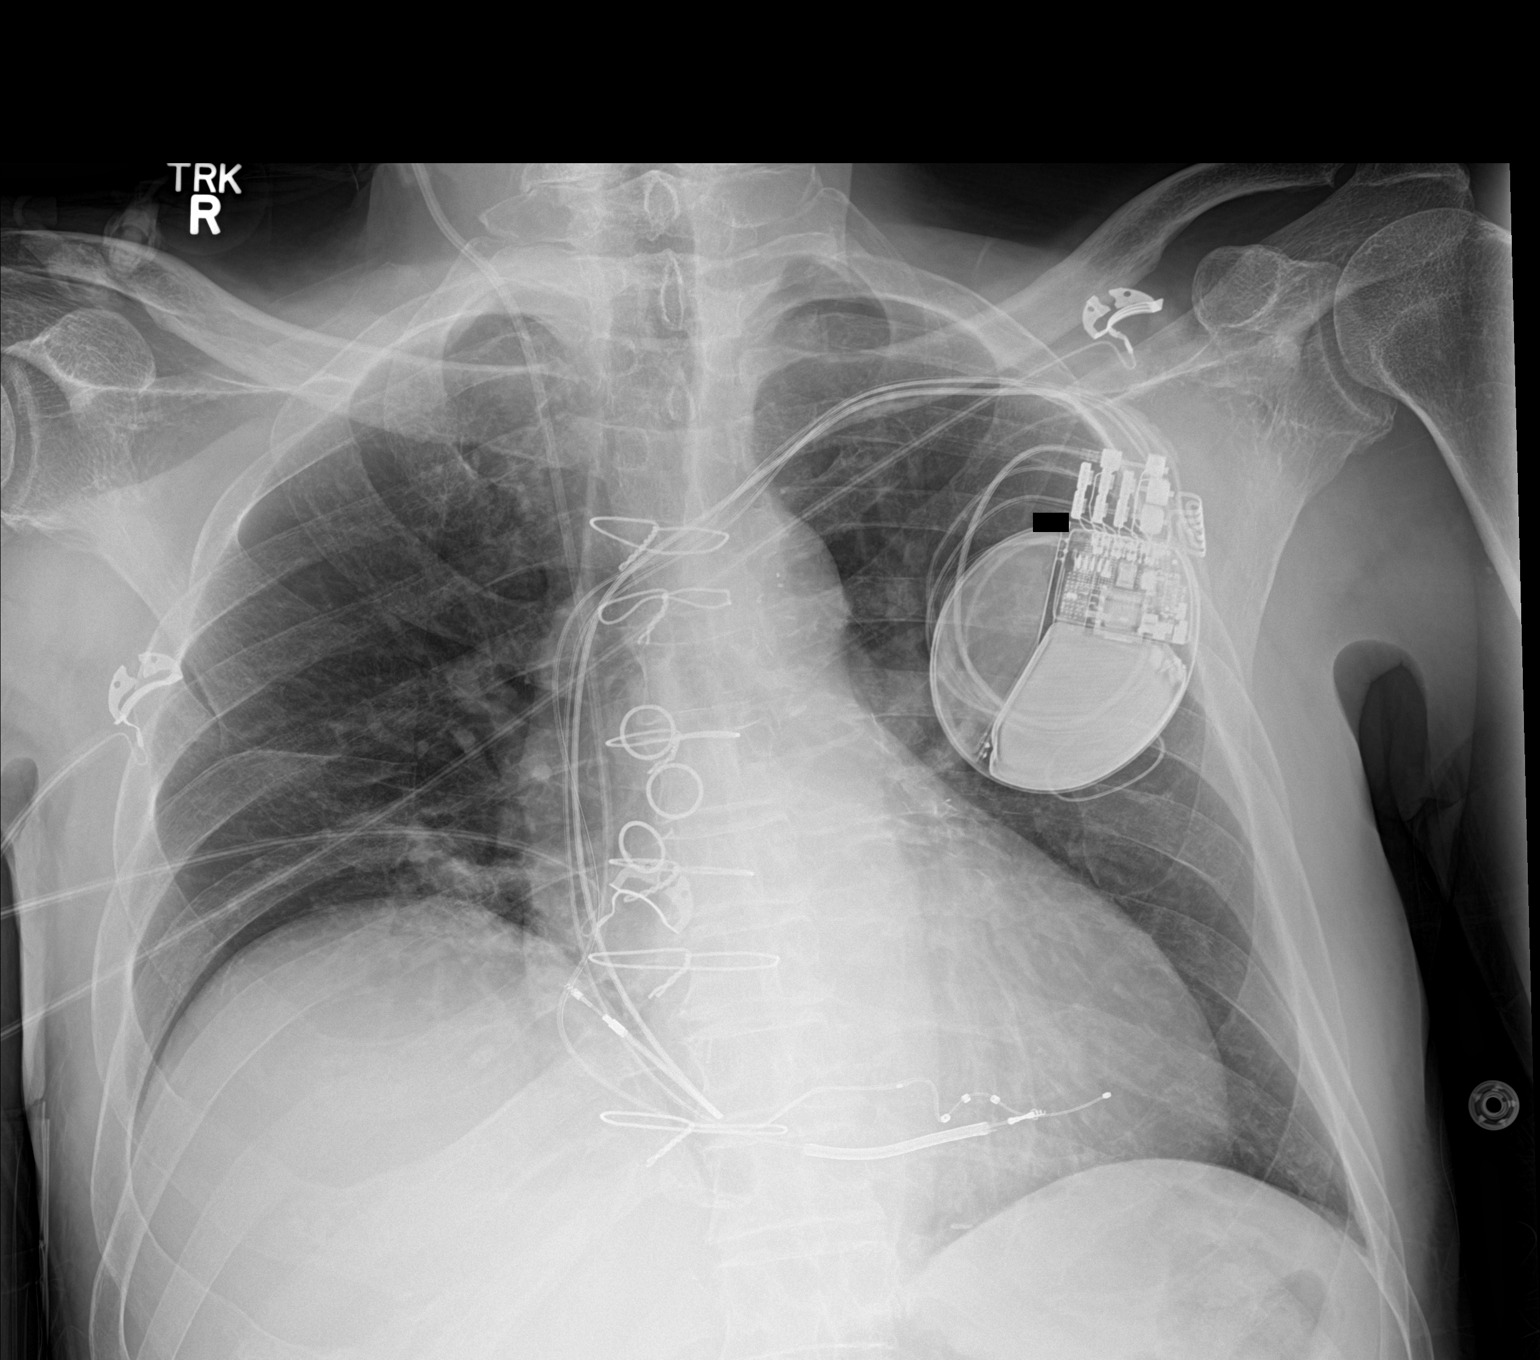

[2 of 2 positions shown; findings below may reference images not displayed]

FINDINGS: Interval placement of right IJ central line with distal tip
terminating at the level of the mid right atrium. Left-sided
implanted cardiac device. Stable heart size status post CABG.
Chronic elevation of the right hemidiaphragm. Right basilar
atelectasis. Previously seen nodular opacity in the right lung base
is no longer evident. Left lung is clear. No pleural effusion. No
pneumothorax.
IMPRESSION: 1. Interval placement of right IJ central line with distal tip
terminating at the level of the mid right atrium. No pneumothorax.
2. Chronic elevation of the right hemidiaphragm with right basilar
atelectasis.
3. Previously described nodular opacity at the right lung base is no
longer evident, compatible with a focus of atelectasis.

## 2021-11-02 MED ORDER — AMIODARONE LOAD VIA INFUSION
150.0000 mg | Freq: Once | INTRAVENOUS | Status: AC
  Administered 2021-11-02: 150 mg via INTRAVENOUS
  Filled 2021-11-02: qty 83.34

## 2021-11-02 MED ORDER — INSULIN DETEMIR 100 UNIT/ML ~~LOC~~ SOLN
8.0000 [IU] | Freq: Two times a day (BID) | SUBCUTANEOUS | Status: DC
  Administered 2021-11-02: 8 [IU] via SUBCUTANEOUS
  Filled 2021-11-02 (×3): qty 0.08

## 2021-11-02 MED ORDER — SODIUM CHLORIDE 0.9 % IV SOLN
250.0000 mL | INTRAVENOUS | Status: DC
  Administered 2021-11-03 – 2021-11-19 (×3): 250 mL via INTRAVENOUS

## 2021-11-02 MED ORDER — SODIUM CHLORIDE 0.9 % IV SOLN
2.0000 g | INTRAVENOUS | Status: DC
  Administered 2021-11-02: 2 g via INTRAVENOUS
  Filled 2021-11-02: qty 12.5

## 2021-11-02 MED ORDER — SODIUM CHLORIDE 0.9 % IV SOLN
2.0000 g | INTRAVENOUS | Status: DC
  Administered 2021-11-02 – 2021-11-03 (×2): 2 g via INTRAVENOUS
  Filled 2021-11-02 (×2): qty 20

## 2021-11-02 MED ORDER — AMIODARONE HCL IN DEXTROSE 360-4.14 MG/200ML-% IV SOLN
60.0000 mg/h | INTRAVENOUS | Status: DC
  Administered 2021-11-02 (×2): 60 mg/h via INTRAVENOUS
  Filled 2021-11-02 (×2): qty 200

## 2021-11-02 MED ORDER — ASPIRIN 81 MG PO CHEW
81.0000 mg | CHEWABLE_TABLET | Freq: Every day | ORAL | Status: DC
  Administered 2021-11-03 – 2021-11-05 (×3): 81 mg via ORAL
  Filled 2021-11-02 (×4): qty 1

## 2021-11-02 MED ORDER — INSULIN ASPART 100 UNIT/ML IJ SOLN
0.0000 [IU] | Freq: Three times a day (TID) | INTRAMUSCULAR | Status: DC
  Administered 2021-11-03 (×2): 1 [IU] via SUBCUTANEOUS
  Administered 2021-11-03: 2 [IU] via SUBCUTANEOUS
  Administered 2021-11-04: 7 [IU] via SUBCUTANEOUS
  Administered 2021-11-04: 1 [IU] via SUBCUTANEOUS
  Administered 2021-11-04: 5 [IU] via SUBCUTANEOUS

## 2021-11-02 MED ORDER — INSULIN DETEMIR 100 UNIT/ML ~~LOC~~ SOLN
5.0000 [IU] | Freq: Two times a day (BID) | SUBCUTANEOUS | Status: DC
  Administered 2021-11-02: 5 [IU] via SUBCUTANEOUS
  Filled 2021-11-02 (×2): qty 0.05

## 2021-11-02 MED ORDER — SODIUM CHLORIDE 0.9% FLUSH: 10.0000 mL | INTRAVENOUS | Status: DC | PRN

## 2021-11-02 MED ORDER — VASOPRESSIN 20 UNITS/100 ML INFUSION FOR SHOCK
0.0000 [IU]/min | INTRAVENOUS | Status: DC
  Administered 2021-11-02 (×2): 0.03 [IU]/min via INTRAVENOUS
  Filled 2021-11-02 (×2): qty 100

## 2021-11-02 MED ORDER — AMIODARONE HCL IN DEXTROSE 360-4.14 MG/200ML-% IV SOLN: 30.0000 mg/h | INTRAVENOUS | Status: DC

## 2021-11-02 MED ORDER — PHENYLEPHRINE HCL-NACL 20-0.9 MG/250ML-% IV SOLN
25.0000 ug/min | INTRAVENOUS | Status: DC
  Administered 2021-11-02: 25 ug/min via INTRAVENOUS
  Administered 2021-11-02 (×3): 200 ug/min via INTRAVENOUS
  Filled 2021-11-02 (×3): qty 250
  Filled 2021-11-02: qty 500
  Filled 2021-11-02: qty 250

## 2021-11-02 MED ORDER — SODIUM CHLORIDE 0.9 % IV SOLN: INTRAVENOUS | Status: AC

## 2021-11-02 MED ORDER — LACTATED RINGERS IV SOLN: INTRAVENOUS | Status: DC

## 2021-11-02 MED ORDER — VANCOMYCIN HCL IN DEXTROSE 1-5 GM/200ML-% IV SOLN: 1000.0000 mg | INTRAVENOUS | Status: DC

## 2021-11-02 MED ORDER — POLYVINYL ALCOHOL 1.4 % OP SOLN
1.0000 [drp] | OPHTHALMIC | Status: DC | PRN
  Administered 2021-11-02: 1 [drp] via OPHTHALMIC
  Filled 2021-11-02: qty 15

## 2021-11-02 MED ORDER — ATORVASTATIN CALCIUM 40 MG PO TABS
40.0000 mg | ORAL_TABLET | Freq: Every day | ORAL | Status: DC
  Administered 2021-11-02: 40 mg via ORAL
  Filled 2021-11-02: qty 1

## 2021-11-02 MED ORDER — SODIUM CHLORIDE 0.9% FLUSH
10.0000 mL | Freq: Two times a day (BID) | INTRAVENOUS | Status: DC
  Administered 2021-11-03 (×3): 10 mL
  Administered 2021-11-04: 20 mL
  Administered 2021-11-05 – 2021-11-19 (×12): 10 mL
  Administered 2021-11-24: 40 mL
  Administered 2021-11-25 – 2021-12-07 (×16): 10 mL

## 2021-11-02 MED ORDER — PERFLUTREN LIPID MICROSPHERE
1.0000 mL | INTRAVENOUS | Status: AC | PRN
  Administered 2021-11-02: 4 mL via INTRAVENOUS

## 2021-11-02 NOTE — Progress Notes (Signed)
Pharmacy Antibiotic Note  Glenn [name] is a 81 y.o. male admitted on 10/20/2021 with sepsis.  Pharmacy has been consulted for Vancomycin/Cefepime dosing. WBC is elevated. Noted renal dysfunction. Transfer from [facility].     Plan: Vancomycin 1000 mg IV q48h (received 1500 mg load at [facility]) >>>Estimated AUC: 545 Cefepime 2g IV q24h Trend WBC, temp, renal function  F/U infectious work-up Drug levels as indicated   Weight: 59.8 kg (131 lb 13.4 oz)  Temp (24hrs), Avg:97.8 F (36.6 C), Min:97.5 F (36.4 C), Max:97.9 F (36.6 C)  Recent Labs  Lab 10/03/2021 1628 10/11/2021 1817 11/02/21 0119  WBC 24.7*  --  24.6*  CREATININE 3.62* 3.16* 3.10*  LATICACIDVEN  --  1.7 1.7    Estimated Creatinine Clearance: 16.6 mL/min (A) (by C-G formula based on SCr of 3.1 mg/dL (H)).    No Known Allergies  Abran Duke, PharmD, BCPS Clinical Pharmacist Phone: [phone]

## 2021-11-02 NOTE — Progress Notes (Addendum)
eLink Physician-Brief Progress Note Patient Name: Glenn [name] DOB: 1940/08/15 MRN: [id number]   Date of Service  11/02/2021  HPI/Events of Note  Patient having frequent runs of NSVT 5-8 beats and multiform PVC's. Patient is in a paced rhythm. K+ = 4.0 and Mg++ = 2.1. Sat = 100% and BP = 97/62. Patient is currently on Norepinephrine IV infusion at 6 mcg/min which is likely aggravating myocardial irritability. Troponin = 85. EKG reveals atrial sensed and ventricular paced rhythm.  eICU Interventions  Plan: Amiodarone IV load and infusion.  Phenylephrine IV infusion. Titrate to MAP >= 65. Wean Norepinephrine IV infusion as tolerated.  Cardiac echo in AM. PCCM rounding team to arrange for Cardiology consultation in AM.     Intervention Category Major Interventions: Arrhythmia - evaluation and management  [name] 11/02/2021, 5:06 AM

## 2021-11-02 NOTE — Progress Notes (Addendum)
PHARMACY - PHYSICIAN COMMUNICATION CRITICAL VALUE ALERT - BLOOD CULTURE IDENTIFICATION (BCID)  Glenn [name] is an 81 y.o. male who presented to [facility] on 10/05/2021 with a chief complaint of urosepsis  Assessment:   2/2 blood cultures growing Klebsiella pneumoniae  Name of physician (or Provider) Contacted:  Dr. Gaynell Face  Current antibiotics:  Vancomycin and Cefepime   Changes to prescribed antibiotics recommended:  Suggest narrowing to Rocephin 2 g IV q24h  Results for orders placed or performed during the hospital encounter of 10/28/2021  Blood Culture ID Panel (Reflexed) (Collected: 10/02/2021  5:07 PM)  Result Value Ref Range   Enterococcus faecalis NOT DETECTED NOT DETECTED   Enterococcus Faecium NOT DETECTED NOT DETECTED   Listeria monocytogenes NOT DETECTED NOT DETECTED   Staphylococcus species NOT DETECTED NOT DETECTED   Staphylococcus aureus (BCID) NOT DETECTED NOT DETECTED   Staphylococcus epidermidis NOT DETECTED NOT DETECTED   Staphylococcus lugdunensis NOT DETECTED NOT DETECTED   Streptococcus species NOT DETECTED NOT DETECTED   Streptococcus agalactiae NOT DETECTED NOT DETECTED   Streptococcus pneumoniae NOT DETECTED NOT DETECTED   Streptococcus pyogenes NOT DETECTED NOT DETECTED   A.calcoaceticus-baumannii NOT DETECTED NOT DETECTED   Bacteroides fragilis NOT DETECTED NOT DETECTED   Enterobacterales DETECTED (A) NOT DETECTED   Enterobacter cloacae complex NOT DETECTED NOT DETECTED   Escherichia coli NOT DETECTED NOT DETECTED   Klebsiella aerogenes NOT DETECTED NOT DETECTED   Klebsiella oxytoca NOT DETECTED NOT DETECTED   Klebsiella pneumoniae DETECTED (A) NOT DETECTED   Proteus species NOT DETECTED NOT DETECTED   Salmonella species NOT DETECTED NOT DETECTED   Serratia marcescens NOT DETECTED NOT DETECTED   Haemophilus influenzae NOT DETECTED NOT DETECTED   Neisseria meningitidis NOT DETECTED NOT DETECTED   Pseudomonas aeruginosa NOT DETECTED NOT DETECTED    Stenotrophomonas maltophilia NOT DETECTED NOT DETECTED   Candida albicans NOT DETECTED NOT DETECTED   Candida auris NOT DETECTED NOT DETECTED   Candida glabrata NOT DETECTED NOT DETECTED   Candida krusei NOT DETECTED NOT DETECTED   Candida parapsilosis NOT DETECTED NOT DETECTED   Candida tropicalis NOT DETECTED NOT DETECTED   Cryptococcus neoformans/gattii NOT DETECTED NOT DETECTED   CTX-M ESBL NOT DETECTED NOT DETECTED   Carbapenem resistance IMP NOT DETECTED NOT DETECTED   Carbapenem resistance KPC NOT DETECTED NOT DETECTED   Carbapenem resistance NDM NOT DETECTED NOT DETECTED   Carbapenem resist OXA 48 LIKE NOT DETECTED NOT DETECTED   Carbapenem resistance VIM NOT DETECTED NOT DETECTED    Eddie Candle 11/02/2021  4:25 AM

## 2021-11-02 NOTE — Progress Notes (Signed)
  Asked to check pt device to ensure MRI compatibility.   Pt with [employer] CRT-D that IS MRI compatible.   No sustained VT in detection zone.   Pt with rare runs of NSVT, but the other longer episodes marked as VT on telemetry appear to show intermittent RV pacing, where pt is primarily LV pacing.   Discussed with CCM PA who deferred cardiology consultation for now.   Echo pending.   Please call [facility] if general cardiology consultation is still desired.   Casimiro Needle [address] [place], [place]  11/02/2021

## 2021-11-02 NOTE — Progress Notes (Signed)
NAME:  Glenn [name], MRN:  [id number], DOB:  Sep 22, 1940, LOS: 1 ADMISSION DATE:  10/15/2021, CONSULTATION DATE:  10/16/2021 REFERRING MD:  [facility] EDP, CHIEF COMPLAINT:  generally weak   History of Present Illness:  81 yo male with pmh of iddm with hyperglycemia, chf with ppm in place, cad, prostatomegaly presented to [facility] after not feeling well, generally weak x 3 days, and 2 days of vomiting/ poor PO intake after mechanical fall 3 days ago hitting his back and buttocks, since with progressive difficulty with ambulation.  Denies fever, chills, nausea, dirrhea. No CP, dizziness, ha, sob. No other associated symptoms.   Found to be afebrile and hypotensive at OSH, initially fluid responsive but ended up peripheral pressors.  Workup notable for UTI, AKI, transaminitis with elevated INR, hyponatremic, and imaging findings concerning for lumbar osteomyelitis vs discitis and left hydronephrosis with bladder outlet obstruction.  Foley placed with 1.7L UOP.  Transferred to [facility] given needs for MRI and determine PPM compatibility.   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  - mostly followed by [facility]> records limited   [place] [facility] Events: Including procedures, antibiotic start and stop dates in addition to other pertinent events   -5/31: transferred to [facility] from [facility] for MRI given PPM.  Urosepsis/ shock with r/o lumbar osteomyelitis/ discitis    5/31 CT a/p wo>  1. Gas within the disc space at L2-3 and dissemination of gas into the LEFT psoas and RIGHT retroperitoneum as well as potentially in the epidural space posterior to L2. Findings raise the question of discitis/osteomyelitis with gas-forming organism. Sampling of this area or MRI as possible may be helpful for further evaluation. 2. Signs of marked urinary bladder distension compatible with bladder outlet obstruction with less collecting system dilation than on previous imaging.  Correlate with signs of urinary tract infection and or urosepsis. 3. Generalized mesenteric and body wall edema. 4. Cholelithiasis without evidence of acute cholecystitis. 5. Marked cardiomegaly with pacer defibrillator. Aortic Atherosclerosis  Interim History / Subjective:  No complaints currently by patient  Runs of PVC/ NSVT early this am> placed on amio gtt and pressors changed over from NE to Neo  Blood cultures 2/2 klebsiella pneumonia   Objective   Blood pressure 95/61, pulse 88, temperature 97.8 F (36.6 C), temperature source Oral, resp. rate (!) 21, weight 59.8 kg, SpO2 100 %.        Intake/Output Summary (Last 24 hours) at 11/02/2021 0725 Last data filed at 11/02/2021 0600 Gross per 24 hour  Intake 492.2 ml  Output 1425 ml  Net -932.8 ml   Filed Weights   10/02/2021 2333  Weight: 59.8 kg    Examination: General:  chronically ill appearing, cachectic male lying in bed in NAD HEENT: MM pink/dry Neuro:  Alert, oriented, MAE> no focal deficits noted thus far, denies any changes in sensation/ bowel or bladder incontinence  CV: paced rhythm  PULM:  non labored, CTA  GI: soft, bs+, ND, NT,  foley  Extremities: warm/dry, trace pedal edema, abrasion left shin  Skin: no rashes  BG improving> 366> 261> 198 UOP 275 ml/ 4 hrs   Resolved Hospital Problem list     Assessment & Plan:  Septic shock 2/2 UTI and Klebsiella bacteremia   - also need to rule out lumbar osteomyelitis/ discitis  P:  - ok to switch back to NE from Neo for MAP goal > 65 - continue gentle MIVF - might need CVL  if pressor requirements do not improve - ID consulting, appreciate assistance.  Abx> changing to ceftriaxone.  Stopping cefepime/ vanc  - follow UC - MRI lumbar spine today- PPM is compatible    Presumed NSVT, frequent ectopy P: - PPM evaluated by EP> PPM is [employer] CRT-D which IS MRI compatible - telemetry reviewed> events presumed NSVT are his RV paced beats but he is primarily LV  paced.  Amiodarone d/c'd as not needed.   - continue telemetry monitoring   AKI 2/2 septic shock and urinary retention/ bladder outlet obstruction  Left hydronephrosis likely secondary to urinary retention Hyponatremia- presumed hypovolemic hyponatremia NAGMA/ lactic acidosis  P:  - UOP stable, monitor closely.  sCr improved somewhat from initial labs - daughter reports hx urinary retention and prior TURP x 3 in past but no issues over the last year.   - most recent sCr 10/06/21 at 1.7, Na 133 - continue foley  - renal US today to ensure left hydronephrosis resolved  - repeat BMET at 2000 and in am  - Trend BMP / urinary output/ daily weights - Replace electrolytes as indicated - Avoid nephrotoxic agents, ensure adequate renal perfusion  ? Osteomyelitis/discitis  - pending MRI lumbar - ID following - may need NSGY consult - neurovascular checks q4  Transaminitis, likely secondary to shock Coagulopathy Thrombocytopenia - likely related to sepsis vs liver, normal platelets at beginning of month P:  - ? On eliquis for afib.  Awaiting home meds rec - repeat LFTs and INR in am  - monitor for signs of bleeding, none at present  - SCDs only   IDDM with Hyperglycemia:  - A1c 10.6 - adding levemir 5 units BID and cont SSI sensitive for now.    H/o CHF  PPM- mostly Vpaced, at times is RV paced P:  - cont telemetry monitoring, remains consistently Vpaced - echo ordered/ pending - per daughter, sounds like patient just had a DCCV for afib on 5/22.  Will try and get records.  Still holding on [facility] for now given LFTs, thrombocytopenia, and for any possible procedure today.    Best Practice (right click and "Reselect all SmartList Selections" daily)   Diet/type: clear liquids DVT prophylaxis: SCD GI prophylaxis: PPI Lines: N/A Foley:  Yes, and it is still needed Code Status:  full code Last date of multidisciplinary goals of care discussion 6/1 Daughter, Elmer Fichtel  [phone] updated by phone.  Family will call/ bring in meds.   Labs   CBC: Recent Labs  Lab 10/22/2021 1628 11/02/21 0119  WBC 24.7* 24.6*  HGB 11.2* 12.9*  HCT 34.2* 38.4*  MCV 83.0 84.2  PLT 104* 89*    Basic Metabolic Panel: Recent Labs  Lab 10/06/2021 1628 10/06/2021 1817 11/02/21 0119  NA 129* 129* 130*  K 3.9 3.8 4.0  CL 94* 97* 97*  CO2 21* 19* 19*  GLUCOSE 416* 379* 344*  BUN 115* 116* 114*  CREATININE 3.62* 3.16* 3.10*  CALCIUM 8.2* 8.0* 8.3*  MG  --   --  2.1   GFR: Estimated Creatinine Clearance: 16.6 mL/min (A) (by C-G formula based on SCr of 3.1 mg/dL (H)). Recent Labs  Lab 10/12/2021 1628 10/26/2021 1817 11/02/21 0119  WBC 24.7*  --  24.6*  LATICACIDVEN  --  1.7 1.7    Liver Function Tests: Recent Labs  Lab 10/18/2021 1817 11/02/21 0119  AST 74* 106*  ALT 49* 80*  ALKPHOS 164* 216*  BILITOT 1.4* 1.0  PROT 5.8* 6.1*  ALBUMIN 2.6* 2.5*   No results for input(s): LIPASE, AMYLASE in the last 168 hours. No results for input(s): AMMONIA in the last 168 hours.  ABG    Component Value Date/Time   PHART 7.37 12/22/2014 2215   PCO2ART 35 12/22/2014 2215   PO2ART 98 12/22/2014 2215   HCO3 17.9 (L) 10/12/2021 1817   ACIDBASEDEF 8.7 (H) 10/31/2021 1817   O2SAT 82 10/23/2021 1817     Coagulation Profile: Recent Labs  Lab 10/03/2021 1817 11/02/21 0119  INR 3.1* 2.6*    Cardiac Enzymes: No results for input(s): CKTOTAL, CKMB, CKMBINDEX, TROPONINI in the last 168 hours.  HbA1C: Hgb A1c MFr Bld  Date/Time Value Ref Range Status  11/02/2021 01:19 AM 10.6 (H) 4.8 - 5.6 % Final    Comment:    (NOTE) Pre diabetes:          5.7%-6.4%  Diabetes:              >6.4%  Glycemic control for   <7.0% adults with diabetes     CBG: Recent Labs  Lab 10/19/2021 2015 10/23/2021 2336 11/02/21 0325  GLUCAP 392* 366* 261*     Critical care time:  40 mins     Kennieth Rad, ACNP [facility] 11/02/2021, 7:25 AM  See Amion for  pager If no response to pager, please call PCCM consult pager After 7:00 pm call Elink

## 2021-11-02 NOTE — H&P (Signed)
NAME:  Glenn [name], MRN:  [id number], DOB:  March 11, 1941, LOS: 1 ADMISSION DATE:  10/08/2021, CONSULTATION DATE:  10/04/2021 REFERRING MD:  [facility] EDP, CHIEF COMPLAINT:  generally weak   History of Present Illness:  81 yo male with pmh of iddm with hyperglycemia, chf with ppm in place, cad, prostatomegaly presented to [facility] after not feeling well and generally weak x 3 days. Pt had fallen approx 3 day prior and hit his back with buttock and lower back pain.  He denies syncope stating he was walking backwards and "just lost his footing". He lives with his daughter and he hoped his pain would gradually improve but it did not and he was finding great difficulty with ambulation. After waiting 3 days they decided to call EMS. Pt does endorse vomiting x1 2 days ago and decreased po intake 2/2 lack of appetite for 3 days. Pt also states was having diminished urine output but had attributed that to the decreased intake.  Denies fever, chills, nausea, dirrhea. No CP, dizziness, ha, sob. No other associated symptoms.   Upon presentation to ED at OSH he was found to be hypotensive and while fluid responsive initially, now req vasopressor support at levophed of 67mg. He is alert and oriented and feeling improved pain and weakness since presentation.   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   Significant Hospital Events: Including procedures, antibiotic start and stop dates in addition to other pertinent events   [facility]  Interim History / Subjective:  As above  Objective   Blood pressure 116/75, pulse 87, temperature (!) 97.5 F (36.4 C), temperature source Oral, resp. rate 18, weight 59.8 kg, SpO2 100 %.       No intake or output data in the 24 hours ending 11/02/21 0055  Filed Weights   10/09/2021 2333  Weight: 59.8 kg    Examination: General: cachectic male laying supine in bed in no acute distress.  Aaox3  HENT: NCAT, EOMI, PERRLA, MM dry but pink Lungs: CTA B Cardiovascular: RRR Abdomen: soft, NT, ND BS diminished Extremities: no c/c, trace pedal edema. Abrasion on L shin and large blister on dorsal aspect of L foot Neuro: follows commands, conversant appropriately, no focal deficit GU: foley in place with milky appearing urine in bag  Resolved Hospital Problem list     Assessment & Plan:  Septic shock 2/2 uti:  -titrate vasopressors to map >65 -cont abx -ua with cx pending as are blood cx -no perinephric stranding or air within the urinary system or renal parenchyma to suggest pyelo+/-emphysematous pyelo -suspect the L hydro was 2/2 the bladder outlet obstruction that released over 1.7 L when follow placed -cont with gentle IVF -lactate thankfully only 1.7 Leukocytosis:  -24.7K -follow trend AKI 2/2 above:  -per osh pt had urinary retention that has been resolved hopefully this will help resolve aki Cr is already somewhat downtrending -last Cr in 2018 was 1.1 -follow renal indices and uop -no obstructing nephrolithiasis seen on ct Hyponatremia:  -con't gentle IVF with NS to facilitate increase  ? Osteomyelitis/discitis:  -pt transferred for this eval as at osh pt cannot receive mri with pacer in place -will order MRI here -cont abx as above -alk phos is elevated as well  -will allow only clears for now should some intervention be needed in am  Transaminitis:  -noted likely 2/2 shock  -trend Coagulopathy:  -inr 3.1 -no overt  bleeding.  -follow trend  IDDM with Hyperglycemia:  -ssi -check a1c  H/o chf:  -no echo on file -could order here, certainly if blood cx are positive this will be necessary Ppm in place:  -paced rhythm -will need to determine device type and contact rep as he appears to be dependant on it and would benefit from MRI   Best Practice (right click and "Reselect all SmartList Selections" daily)   Diet/type: clear liquids DVT  prophylaxis: SCD GI prophylaxis: N/A Lines: N/A Foley:  N/A Code Status:  full code Last date of multidisciplinary goals of care discussion [d/w pt today]  Labs   CBC: Recent Labs  Lab 10/03/2021 1628  WBC 24.7*  HGB 11.2*  HCT 34.2*  MCV 83.0  PLT 104*     Basic Metabolic Panel: Recent Labs  Lab 10/14/2021 1628 10/06/2021 1817  NA 129* 129*  K 3.9 3.8  CL 94* 97*  CO2 21* 19*  GLUCOSE 416* 379*  BUN 115* 116*  CREATININE 3.62* 3.16*  CALCIUM 8.2* 8.0*    GFR: Estimated Creatinine Clearance: 16.3 mL/min (A) (by C-G formula based on SCr of 3.16 mg/dL (H)). Recent Labs  Lab 10/07/2021 1628 10/31/2021 1817  WBC 24.7*  --   LATICACIDVEN  --  1.7     Liver Function Tests: Recent Labs  Lab 10/08/2021 1817  AST 74*  ALT 49*  ALKPHOS 164*  BILITOT 1.4*  PROT 5.8*  ALBUMIN 2.6*    No results for input(s): LIPASE, AMYLASE in the last 168 hours. No results for input(s): AMMONIA in the last 168 hours.  ABG    Component Value Date/Time   PHART 7.37 12/22/2014 2215   PCO2ART 35 12/22/2014 2215   PO2ART 98 12/22/2014 2215   HCO3 17.9 (L) 10/28/2021 1817   ACIDBASEDEF 8.7 (H) 10/13/2021 1817   O2SAT 82 10/08/2021 1817      Coagulation Profile: Recent Labs  Lab 10/16/2021 1817  INR 3.1*     Cardiac Enzymes: No results for input(s): CKTOTAL, CKMB, CKMBINDEX, TROPONINI in the last 168 hours.  HbA1C: No results found for: HGBA1C  CBG: Recent Labs  Lab 10/03/2021 2015 10/18/2021 2336  GLUCAP 392* 366*     Review of Systems:   Per HPI  Past Medical History:  He,  has a past medical history of Diabetes mellitus without complication ([place]), Exposure to Agent Orange, Hypertension, MI (myocardial infarction) ([place]), and Renal disorder.   Surgical History:   Past Surgical History:  Procedure Laterality Date   BIOPSY PROSTATE     CORONARY ARTERY BYPASS GRAFT     x 4   KIDNEY STONE SURGERY       Social History:   reports that he has quit smoking. He has  never used smokeless tobacco. He reports current alcohol use.   Family History:  His family history is not on file.   Allergies No Known Allergies   Home Medications  Prior to Admission medications   Medication Sig Start Date End Date Taking? Authorizing Provider  cephALEXin (KEFLEX) 500 MG capsule Take 1 capsule (500 mg total) by mouth 2 (two) times daily. 09/04/16   Harvest Dark, MD  furosemide (LASIX) 20 MG tablet Take 1 tablet (20 mg total) by mouth daily. 09/04/16 09/14/16  Harvest Dark, MD  insulin NPH-regular Human (NOVOLIN 70/30) (70-30) 100 UNIT/ML injection Inject 25 Units into the skin daily.    [provider]     Critical care time: 54 min excluding procedured from 2200-0700

## 2021-11-02 NOTE — Progress Notes (Signed)
eLink Physician-Brief Progress Note Patient Name: Glenn [name] DOB: [birth year] MRN: [id number]   Date of Service  11/02/2021  HPI/Events of Note  Patient taking PO with no issue and RN requested to modify SSI and glucose checks from Q4hr to 3 times daily with meals and at bedside  eICU Interventions  Order modified     Intervention Category Major Interventions: Hyperglycemia - active titration of insulin therapy  Oretha Milch 11/02/2021, 11:44 PM  Addendum at 4:40 am K is 3, creatinine 2.2 and improving slowly 40 meq oral K x 1 Recheck K at 11 am

## 2021-11-02 NOTE — Consult Note (Signed)
[facility]    Date of Admission:  10/22/2021     Reason for Consult: Bacteremia     Referring Physician: Dr Carlis Abbott  Current antibiotics: Ceftriaxone  ASSESSMENT:    81 y.o. male admitted with:  Klebsiella pneumonia bacteremia: Secondary to suspected urinary source with history of an enlarged prostate and bladder outlet obstruction.  CT concerning for discitis/OM, possible abscesses in the epidural space, left psoas, and right retroperitoneum.  Suspect UTI leading to lumbar epidural disease, etc.  Septic shock: Due to #1. History of enlarged prostate and kidney stones Acute kidney injury:  Presented with creatinine of 3.62.  Suspect secondary to sepsis and outlet obstruction.  Improved to 3.1 this morning and Foley in place.  Uncontrolled DM: A1c is 10.6. 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   RECOMMENDATIONS:    Continue ceftriaxone MRI lumbar spine.  Severe AKI limits ability to obtain contrast imaging Anticipate findings will lead to NSGY consult if concern for epidural abscess +/- IR consult for drainage of psoas abscess and retroperitoneal abscess Lab monitoring Glycemic control Discussed with CCM Will follow   Principal Problem:   Bacteremia due to Klebsiella pneumoniae Active Problems:   Septic shock (HCC)   Acute kidney injury ([place])   Bladder outlet obstruction   Uncontrolled diabetes mellitus with hyperglycemia ([place])   Pacemaker   MEDICATIONS:    Scheduled Meds:  Chlorhexidine Gluconate Cloth  6 each Topical Q0600   insulin aspart  0-9 Units Subcutaneous Q4H   insulin detemir  5 Units Subcutaneous BID   Continuous Infusions:  sodium chloride 250 mL (11/02/21 0205)   sodium chloride 50 mL/hr at 11/02/21 1337   sodium chloride     cefTRIAXone (ROCEPHIN)  IV     norepinephrine (LEVOPHED) Adult infusion 9 mcg/min (11/02/21 1337)   phenylephrine (NEO-SYNEPHRINE) Adult infusion Stopped (11/02/21 1335)    PRN Meds:.fentaNYL (SUBLIMAZE) injection, polyvinyl alcohol  HPI:    Glenn [name] is a 81 y.o. male with a PMHx as noted below who was transferred from [facility] to [facility] ICU on 10/23/2021 due to the need for MRI.  Patient presented to [facility] on 5/31 with falls and severe low back pain.  He was noted to be hypotensive raising concern for sepsis.  His UA was concerning for infection and BMP showed significant AKI.  He underwent CT abd/pelvis without contrast showing urinary bladder distention c/w outlet obstruction.  Foley catheter was placed as a result.  His CT also showed concern for discitis/OM as well as dissemination of gas into the left psoas and right retroperitoneum.  NSGY was contacted by [facility] whom recommended MRI, however, patient was unable to do so at [facility] due to having a PPM in place.  He was unable to be transferred to [facility] and as a result was transferred to [facility] for MRI evaluation.    Patient reports this morning that he is feeling much better.  He has a history of an enlarged prostate and kidney stones.  He follows with urology at the [facility] and states his last prostate procedure was about 6 months ago.  He reports that for the past few days he has been having a lot of weakness and fatigue coinciding with his back pain after a fall.  He has not had much dysuria but does report having had decreased urine output.  He was started on antibiotics in the ED and now his blood cultures are positive for Klebsiella pneumonia.  Past Medical History:  Diagnosis Date   Diabetes mellitus without complication ([place])    Exposure to Agent Orange    Hypertension    MI (myocardial infarction) ([place])    Renal disorder    kidney stones    Social History   Tobacco Use   Smoking status: Former   Smokeless tobacco: Never  Substance Use Topics   Alcohol use: Yes    Comment: rarely    No family history on file.  Allergies  Allergen Reactions   Heparin Other (See Comments)    Causes "head to feel hot"     Review of Systems  Constitutional:  Positive for chills. Negative for fever.  Respiratory: Negative.    Cardiovascular: Negative.   Gastrointestinal: Negative.   Genitourinary:        + Decreased urine output.  Musculoskeletal:  Positive for back pain and falls.  Skin: Negative.   Neurological:  Negative for focal weakness.  All other systems reviewed and are negative.  OBJECTIVE:   Blood pressure 96/60, pulse 85, temperature 98.1 F (36.7 C), temperature source Oral, resp. rate (!) 27, weight 59.8 kg, SpO2 100 %. Body mass index is 18.92 kg/m.  Physical Exam Constitutional:      General: He is not in acute distress.    Appearance: Normal appearance.  HENT:     Head: Normocephalic and atraumatic.  Eyes:     Extraocular Movements: Extraocular movements intact.     Conjunctiva/sclera: Conjunctivae normal.  Cardiovascular:     Rate and Rhythm: Normal rate and regular rhythm.  Pulmonary:     Effort: Pulmonary effort is normal. No respiratory distress.     Breath sounds: Normal breath sounds.  Abdominal:     General: There is no distension.     Palpations: Abdomen is soft.     Tenderness: There is no abdominal tenderness.  Musculoskeletal:        General: Normal range of motion.     Cervical back: Normal range of motion and neck supple.     Right lower leg: No edema.     Left lower leg: No edema.     Comments: Blood blister noted on left lateral foot.   Skin:    General: Skin is warm and dry.     Findings: No rash.  Neurological:     General: No focal deficit present.     Mental Status: He is alert and oriented to person, place, and time.     Motor: No weakness.  Psychiatric:        Mood and Affect: Mood normal.        Behavior: Behavior normal.     Lab Results: Lab Results  Component Value Date   WBC 24.6 (H) 11/02/2021   HGB 12.9 (L) 11/02/2021   HCT 38.4 (L) 11/02/2021   MCV 84.2 11/02/2021   PLT 89 (L) 11/02/2021    Lab Results  Component Value  Date   NA 130 (L) 11/02/2021   K 4.0 11/02/2021   CO2 19 (L) 11/02/2021   GLUCOSE 344 (H) 11/02/2021   BUN 114 (H) 11/02/2021   CREATININE 3.10 (H) 11/02/2021   CALCIUM 8.3 (L) 11/02/2021   GFRNONAA 20 (L) 11/02/2021   GFRAA >60 09/04/2016    Lab Results  Component Value Date   ALT 80 (H) 11/02/2021   AST 106 (H) 11/02/2021   ALKPHOS 216 (H) 11/02/2021   BILITOT 1.0 11/02/2021    No results found for: CRP  No results  found for: ESRSEDRATE  I have reviewed the micro and lab results in Epic.  Imaging: CT ABDOMEN PELVIS WO CONTRAST  Result Date: 10/19/2021 CLINICAL DATA:  A 81 year old male presents with blunt abdominal trauma by report. Question of sepsis and weakness, also potentially hit lower back and having pain in the buttocks. EXAM: CT ABDOMEN AND PELVIS WITHOUT CONTRAST TECHNIQUE: Multidetector CT imaging of the abdomen and pelvis was performed following the standard protocol without IV contrast. RADIATION DOSE REDUCTION: This exam was performed according to the departmental dose-optimization program which includes automated exposure control, adjustment of the mA and/or kV according to patient size and/or use of iterative reconstruction technique. COMPARISON:  December 21, 2016. FINDINGS: Lower chest: Basilar atelectasis and or scarring at the lung bases. Heart size is enlarged as before. Signs of multi lead pacer device in prior percutaneous coronary intervention as well as sternotomy. Hepatobiliary: Liver with smooth contours. Gallbladder with similar degree of moderate distension and cholelithiasis in the dependent portion the gallbladder. No gross pericholecystic stranding. Pancreas: No gross pancreatic inflammation, limited assessment due to lack of intravenous contrast. Spleen: Normal without signs of adjacent stranding. Adrenals/Urinary Tract: Adrenal glands are normal. Mild LEFT ureteral dilation and mild LEFT hydronephrosis. Mild fullness of RIGHT renal pelvis and ureteral  dilation in the setting of marked distension of the urinary bladder. Urinary bladder is not as distended as on the study of December 21, 2016 though still extends well into the abdomen, to approximately the L3 level on the sagittal reconstructions. Renal contours are similar to prior imaging. No marked perinephric stranding. Stomach/Bowel: No signs of bowel obstruction. Generalized mesenteric edema. The appendix is not visible, no focal signs about the cecum to suggest appendiceal inflammation. Vascular/Lymphatic: Aortic atherosclerosis. There is no gastrohepatic or hepatoduodenal ligament lymphadenopathy. No retroperitoneal or mesenteric lymphadenopathy. No pelvic sidewall lymphadenopathy. Reproductive: Prostatomegaly as before. Other: Gas locules within the LEFT psoas muscle. Small gas locules adjacent to the IVC. Gas within the disc space at the L2-L3 level with new areas of irregularity and disc space narrowing along the L2-3 disc space. Small locules of gas in the epidural space posteriorly. Retroperitoneal gas is greatest on the LEFT within the LEFT psoas. No signs of fracture or traumatic subluxation in the spine. Phenomenon Musculoskeletal: As above mild body wall edema. IMPRESSION: 1. Gas within the disc space at L2-3 and dissemination of gas into the LEFT psoas and RIGHT retroperitoneum as well as potentially in the epidural space posterior to L2. Findings raise the question of discitis/osteomyelitis with gas-forming organism. Sampling of this area or MRI as possible may be helpful for further evaluation. 2. Signs of marked urinary bladder distension compatible with bladder outlet obstruction with less collecting system dilation than on previous imaging. Correlate with signs of urinary tract infection and or urosepsis. 3. Generalized mesenteric and body wall edema. 4. Cholelithiasis without evidence of acute cholecystitis. 5. Marked cardiomegaly with pacer defibrillator. Aortic Atherosclerosis (ICD10-I70.0).  Electronically Signed   By: Zetta Bills M.D.   On: 10/07/2021 18:17   CT HEAD WO CONTRAST (5MM)  Result Date: 10/20/2021 CLINICAL DATA:  Head trauma, moderate-severe EXAM: CT HEAD WITHOUT CONTRAST TECHNIQUE: Contiguous axial images were obtained from the base of the skull through the vertex without intravenous contrast. RADIATION DOSE REDUCTION: This exam was performed according to the departmental dose-optimization program which includes automated exposure control, adjustment of the mA and/or kV according to patient size and/or use of iterative reconstruction technique. COMPARISON:  None Available. BRAIN: BRAIN Cerebral ventricle sizes  are concordant with the degree of cerebral volume loss. Patchy and confluent areas of decreased attenuation are noted throughout the deep and periventricular white matter of the cerebral hemispheres bilaterally, compatible with chronic microvascular ischemic disease. No evidence of large-territorial acute infarction. No parenchymal hemorrhage. No mass lesion. No extra-axial collection. No mass effect or midline shift. No hydrocephalus. Cavum septum pellucidum variant noted. Basilar cisterns are patent. Vascular: No hyperdense vessel. Atherosclerotic calcifications are present within the cavernous internal carotid arteries. Skull: No acute fracture or focal lesion. Sinuses/Orbits: Almost complete opacification of bilateral sphenoid sinuses. Hyperdense debris noted within the sphenoid sinuses. Paranasal sinuses and mastoid air cells are clear. Bilateral lens replacement. The orbits are unremarkable. Other: None. IMPRESSION: 1. No acute intracranial abnormality. 2. 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 Electronically Signed   By: Iven Finn M.D.   On: 10/31/2021 18:08   US RENAL  Result Date: 11/02/2021 CLINICAL DATA:  Acute kidney injury. EXAM: RENAL / URINARY TRACT ULTRASOUND COMPLETE COMPARISON:  Nov 01, 2021.  FINDINGS: Right Kidney: Renal measurements: 11.7 x 5.6 x 4.4 cm = volume: 150 mL. Increased echogenicity of renal parenchyma is noted. No mass or hydronephrosis visualized. Left Kidney: Renal measurements: 13.3 x 6.3 x 5.3 cm = volume: 234 mL. Increased echogenicity of renal parenchyma is noted. 1 cm simple cyst is noted which requires no further follow-up. No mass or hydronephrosis visualized. Bladder: Foley catheter is noted. Moderate bladder wall thickening is noted which most likely is due to lack of distension. No ureteral jets are visualized. Other: Minimal amount of free fluid is noted around the liver. IMPRESSION: Increased echogenicity of renal parenchyma is noted suggesting medical renal disease. No hydronephrosis or renal obstruction is noted. Minimal ascites is noted around the liver. Electronically Signed   By: Marijo Conception M.D.   On: 11/02/2021 09:59   DG Chest Port 1 View  Result Date: 10/18/2021 CLINICAL DATA:  Possible sepsis EXAM: PORTABLE CHEST 1 VIEW COMPARISON:  09/04/2016 FINDINGS: Left-sided implanted cardiac device. Heart size within normal limits status post median sternotomy and CABG. Aortic atherosclerosis. Low lung volumes. Small focal somewhat nodular opacity at the right lung base. No pleural effusion or pneumothorax. IMPRESSION: Small focal opacity at the right lung base. Findings could represent atelectasis, infection, versus pulmonary nodule. Recommend repeat inspiratory PA and lateral radiographs of the chest to see if this finding persists. Electronically Signed   By: Davina Poke D.O.   On: 10/20/2021 17:00   DG Foot Complete Left  Result Date: 10/31/2021 CLINICAL DATA:  Left foot wound. EXAM: LEFT FOOT - COMPLETE 3+ VIEW COMPARISON:  None Available. FINDINGS: There is severe great toe metatarsophalangeal joint space narrowing and peripheral osteophytosis with moderate subchondral sclerosis. Mild-to-moderate joint space narrowing of the interphalangeal joints  diffusely. Small plantar calcaneal heel spur. Mild chronic spurring at the Achilles insertion on the calcaneus. Mild vascular calcifications. Mild soft tissue swelling/enlargement lateral to the fifth metatarsophalangeal joint with convex lateral border. No acute fracture or dislocation. IMPRESSION: 1. Severe great toe metatarsophalangeal joint osteoarthritis. 2. Mild soft tissue swelling/enlarged and lateral to the fifth metatarsophalangeal joint with convex lateral border. Recommend clinical correlation for reported wound versus possible mass. 3. Small plantar calcaneal heel spur. Electronically Signed   By: Yvonne Kendall M.D.   On: 10/05/2021 17:40     Imaging independently reviewed in Epic.  [facility] [place] [phone] pager 11/02/2021, 1:53 PM

## 2021-11-02 NOTE — Progress Notes (Signed)
Echocardiogram 2D Echocardiogram has been performed.  Glenn [name] 11/02/2021, 10:54 AM

## 2021-11-02 NOTE — Procedures (Signed)
Central Venous Catheter Insertion Procedure Note  Glenn [name]  [id number]  January 19, 1941  Date:11/02/21  Time:3:10 PM   Provider Performing:Brooke Lodema Hong   Procedure: Insertion of Non-tunneled Central Venous [phone]) with US guidance ([id number])   Indication(s) Medication administration  Consent Risks of the procedure as well as the alternatives and risks of each were explained to the patient and/or caregiver.  Consent for the procedure was obtained and is signed in the bedside chart  Anesthesia Topical only with 1% lidocaine   Timeout Verified patient identification, verified procedure, site/side was marked, verified correct patient position, special equipment/implants available, medications/allergies/relevant history reviewed, required imaging and test results available.  Sterile Technique Maximal sterile technique including full sterile barrier drape, hand hygiene, sterile gown, sterile gloves, mask, hair covering, sterile ultrasound probe cover (if used).  Procedure Description Area of catheter insertion was cleaned with chlorhexidine and draped in sterile fashion.  With real-time ultrasound guidance a central venous catheter was placed into the right internal jugular vein to 17 cm. Nonpulsatile blood flow and easy flushing noted in all ports.  The catheter was sutured in place and sterile dressing and biopatch applied.      Complications/Tolerance None; patient tolerated the procedure well. Chest X-ray is ordered to verify placement for internal jugular or subclavian cannulation.   Chest x-ray is not ordered for femoral cannulation.  EBL Minimal  Specimen(s) None      Posey Boyer, ACNP [facility] 11/02/2021, 3:11 PM  See Amion for pager If no response to pager, please call PCCM consult pager After 7:00 pm call Elink

## 2021-11-02 NOTE — Consult Note (Addendum)
WOC Nurse Consult Note: Reason for Consult:DTPI to left lateral foot and partial thickness skin loss at left pretibial area Wound type: pressure/trauma/infarct  Pressure Injury POA: Yes Measurement: Left lateral foot: 5.5cm x 4cm area of purple, blood-filled blister. IT is expected that this skin injury will progress to reveal a full thickness wound. Left pretibial area: 2.5cm x 3cm x 0.2cm red, moist, no exudate Wound bed:As noted above Drainage (amount, consistency, odor) As noted above Periwound:intact, dry Dressing procedure/placement/frequency: I have provided Nursing with guidance for the care of these wounds using a nonadherent, antimicrobial wound contact layer (xeroform), topped with dry gauze and secured with paper tape. Daily changes.  Prevalon boots are provided and a sacral foam for PI prophylaxis are requested.  If desired, consider consult with vascular or orthopedics or podiatric medicine for additional input into the POC.  WOC nursing team will not follow, but will remain available to this patient, the nursing and medical teams.  Please re-consult if needed. Thanks, Ladona Mow, MSN, RN, GNP, [place]  Pager# [phone]

## 2021-11-02 DEATH — deceased

## 2021-11-03 ENCOUNTER — Inpatient Hospital Stay (HOSPITAL_COMMUNITY): Payer: Medicare Other

## 2021-11-03 DIAGNOSIS — Z950 Presence of cardiac pacemaker: Secondary | ICD-10-CM | POA: Diagnosis not present

## 2021-11-03 DIAGNOSIS — B961 Klebsiella pneumoniae [K. pneumoniae] as the cause of diseases classified elsewhere: Secondary | ICD-10-CM | POA: Diagnosis not present

## 2021-11-03 DIAGNOSIS — N320 Bladder-neck obstruction: Secondary | ICD-10-CM | POA: Diagnosis not present

## 2021-11-03 DIAGNOSIS — R7881 Bacteremia: Secondary | ICD-10-CM | POA: Diagnosis not present

## 2021-11-03 DIAGNOSIS — N179 Acute kidney failure, unspecified: Secondary | ICD-10-CM | POA: Diagnosis not present

## 2021-11-03 DIAGNOSIS — R7989 Other specified abnormal findings of blood chemistry: Secondary | ICD-10-CM

## 2021-11-03 LAB — CBC
HCT: 32.2 % — ABNORMAL LOW (ref 39.0–52.0)
Hemoglobin: 11.5 g/dL — ABNORMAL LOW (ref 13.0–17.0)
MCH: 28.1 pg (ref 26.0–34.0)
MCHC: 35.7 g/dL (ref 30.0–36.0)
MCV: 78.7 fL — ABNORMAL LOW (ref 80.0–100.0)
Platelets: 78 10*3/uL — ABNORMAL LOW (ref 150–400)
RBC: 4.09 MIL/uL — ABNORMAL LOW (ref 4.22–5.81)
RDW: 15.2 % (ref 11.5–15.5)
WBC: 22.4 10*3/uL — ABNORMAL HIGH (ref 4.0–10.5)
nRBC: 0 % (ref 0.0–0.2)

## 2021-11-03 LAB — COMPREHENSIVE METABOLIC PANEL
ALT: 146 U/L — ABNORMAL HIGH (ref 0–44)
AST: 203 U/L — ABNORMAL HIGH (ref 15–41)
Albumin: 2.1 g/dL — ABNORMAL LOW (ref 3.5–5.0)
Alkaline Phosphatase: 306 U/L — ABNORMAL HIGH (ref 38–126)
Anion gap: 9 (ref 5–15)
BUN: 98 mg/dL — ABNORMAL HIGH (ref 8–23)
CO2: 21 mmol/L — ABNORMAL LOW (ref 22–32)
Calcium: 8.3 mg/dL — ABNORMAL LOW (ref 8.9–10.3)
Chloride: 103 mmol/L (ref 98–111)
Creatinine, Ser: 2.26 mg/dL — ABNORMAL HIGH (ref 0.61–1.24)
GFR, Estimated: 29 mL/min — ABNORMAL LOW (ref >60)
Glucose, Bld: 75 mg/dL (ref 70–99)
Potassium: 3 mmol/L — ABNORMAL LOW (ref 3.5–5.1)
Sodium: 133 mmol/L — ABNORMAL LOW (ref 135–145)
Total Bilirubin: 0.9 mg/dL (ref 0.3–1.2)
Total Protein: 5.7 g/dL — ABNORMAL LOW (ref 6.5–8.1)

## 2021-11-03 LAB — COOXEMETRY PANEL
Carboxyhemoglobin: 1.1 % (ref 0.5–1.5)
Methemoglobin: 0.7 % (ref 0.0–1.5)
O2 Saturation: 78.2 %
Total hemoglobin: 10.5 g/dL — ABNORMAL LOW (ref 12.0–16.0)

## 2021-11-03 LAB — PROTIME-INR
INR: 1.7 — ABNORMAL HIGH (ref 0.8–1.2)
Prothrombin Time: 20 seconds — ABNORMAL HIGH (ref 11.4–15.2)

## 2021-11-03 LAB — POTASSIUM: Potassium: 3.3 mmol/L — ABNORMAL LOW (ref 3.5–5.1)

## 2021-11-03 LAB — CK: Total CK: 81 U/L (ref 49–397)

## 2021-11-03 LAB — GLUCOSE, CAPILLARY
Glucose-Capillary: 53 mg/dL — ABNORMAL LOW (ref 70–99)
Glucose-Capillary: 99 mg/dL (ref 70–99)
Glucose-Capillary: 178 mg/dL — ABNORMAL HIGH (ref 70–99)
Glucose-Capillary: 144 mg/dL — ABNORMAL HIGH (ref 70–99)
Glucose-Capillary: 130 mg/dL — ABNORMAL HIGH (ref 70–99)

## 2021-11-03 LAB — PHOSPHORUS: Phosphorus: 2.7 mg/dL (ref 2.5–4.6)

## 2021-11-03 LAB — MAGNESIUM: Magnesium: 2 mg/dL (ref 1.7–2.4)

## 2021-11-03 IMAGING — MR MR LUMBAR SPINE W/O CM
4 of 5 series · 18 of 48 positions shown · non-contrast
Comparison: CT abdomen pelvis dated [DATE].

CLINICAL DATA: Bacteremia.  Suspected osteomyelitis discitis.

EXAM:
MRI LUMBAR SPINE WITHOUT CONTRAST
TECHNIQUE: Multiplanar, multisequence MR imaging of the lumbar spine was
performed. No intravenous contrast was administered.

[Series 4: T2 · sagittal · 4.0mm · 0.55mm/px · 6 of 13 slices shown (1 of 2)]
[im 1/13]
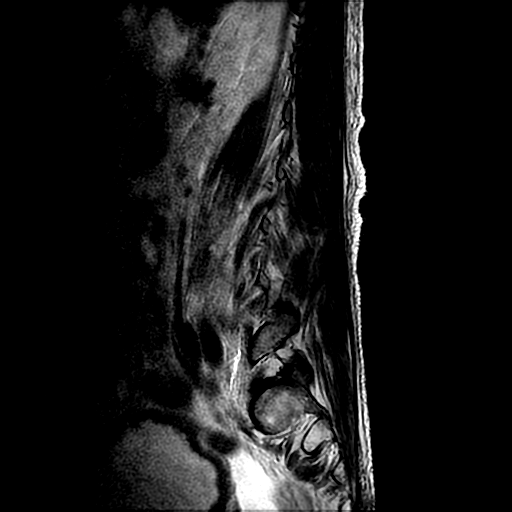
[im 3/13]
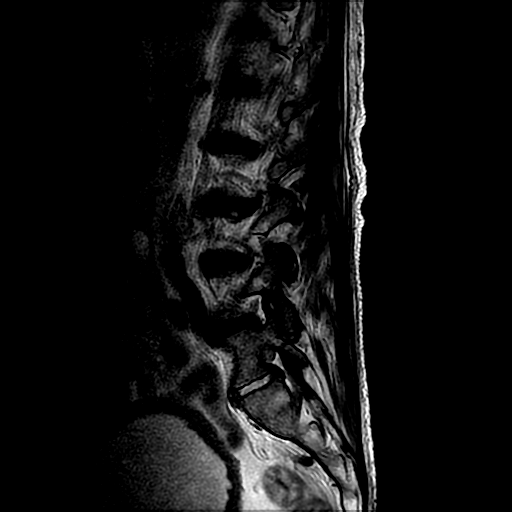
[im 5/13]
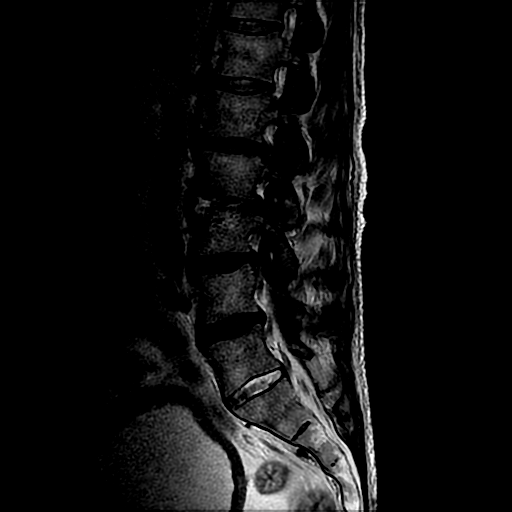
[im 8/13]
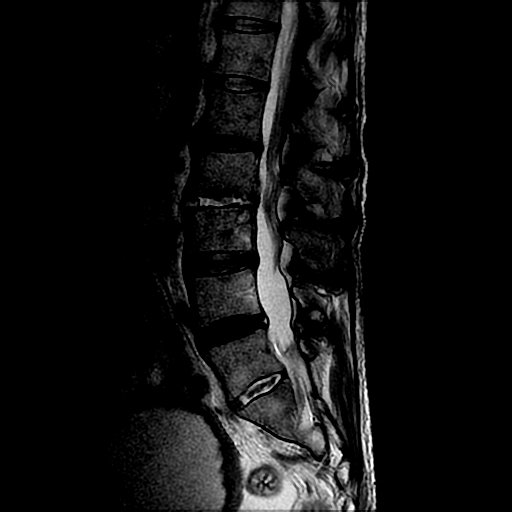
[im 10/13]
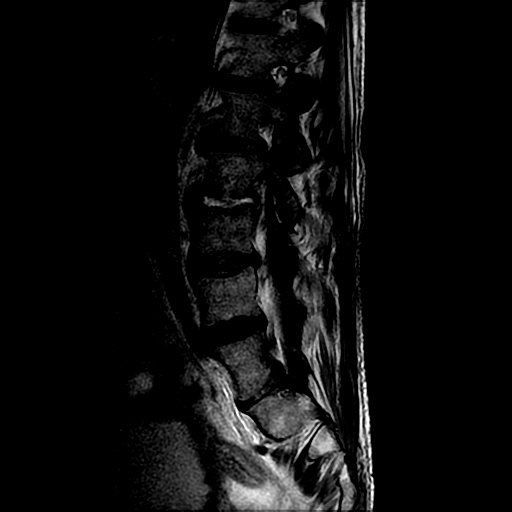
[im 13/13]
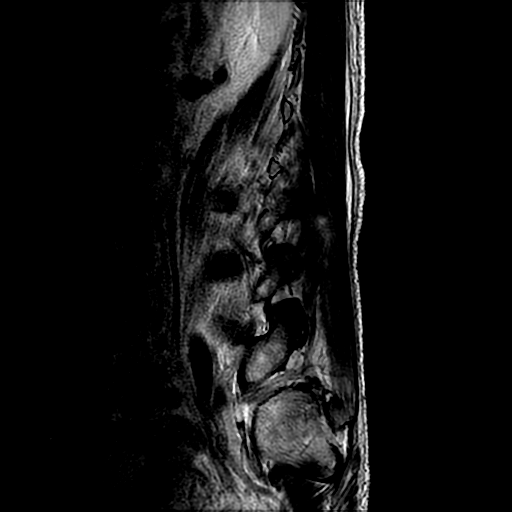

[Series 5: T1 · sagittal · 4.0mm · 0.55mm/px · 3 of 13 slices shown (1 of 2)]
[im 3/13]
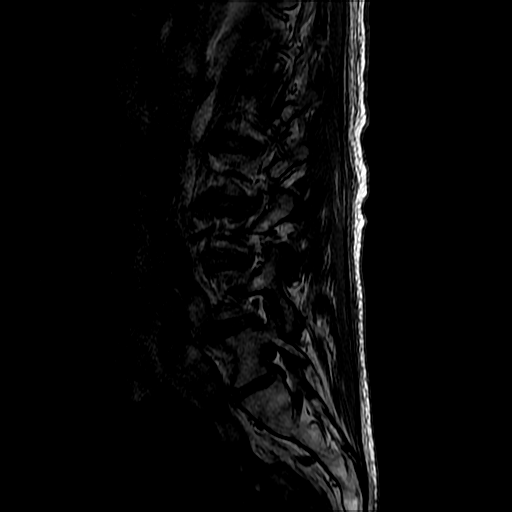
[im 8/13]
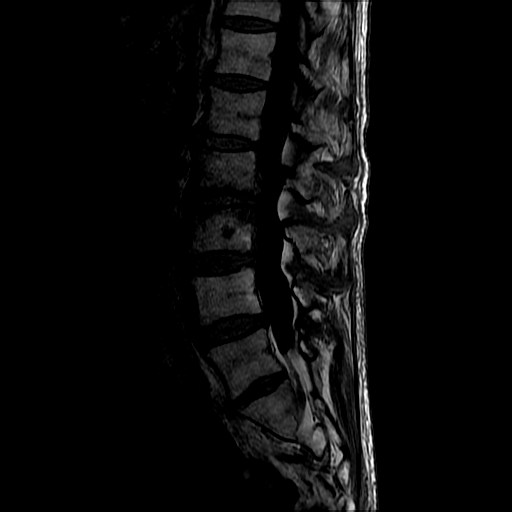
[im 13/13]
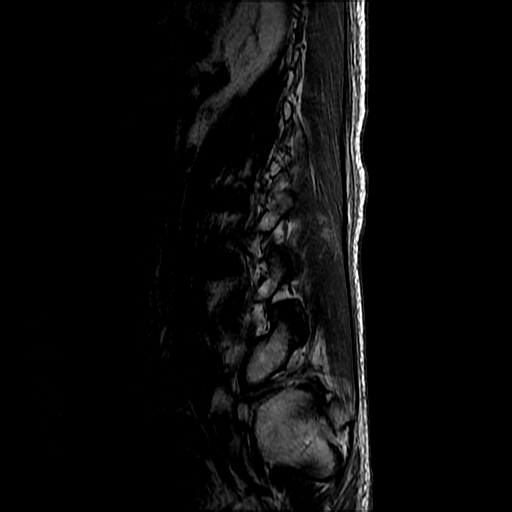

[Series 7: T2 · axial · 4.0mm · 0.41mm/px · z∈[+5,+153]mm · 6 of 31 slices shown (2 of 2)]
[im 1/31]
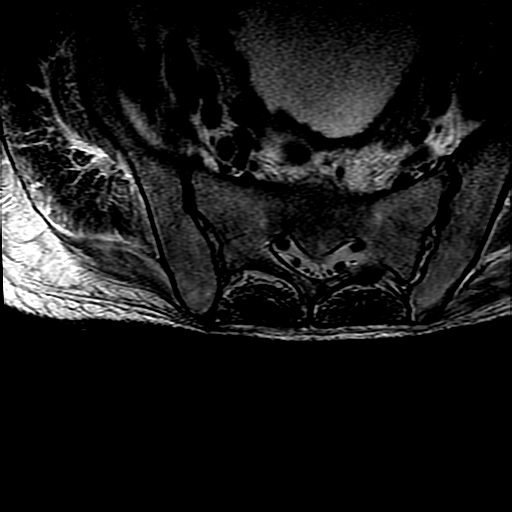
[im 5/31]
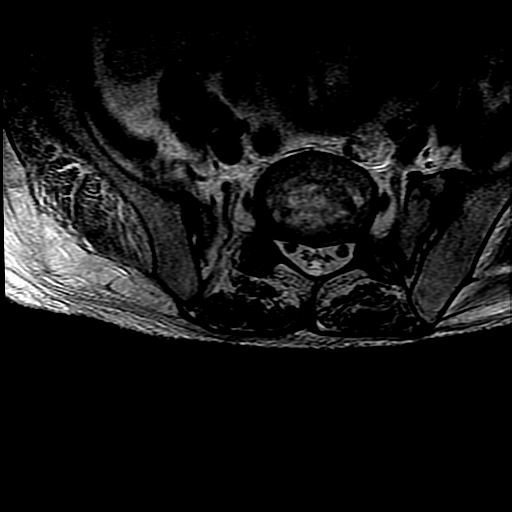
[im 9/31]
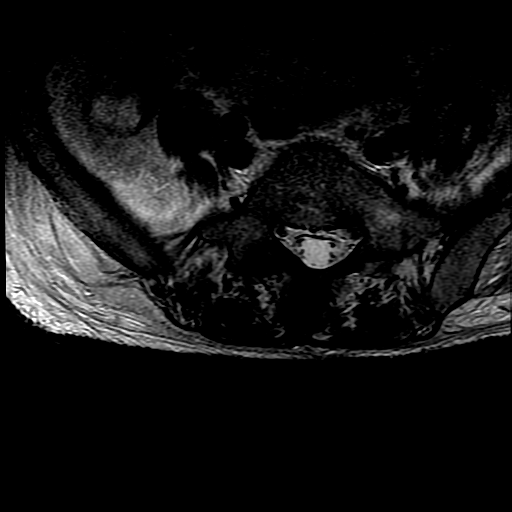
[im 13/31]
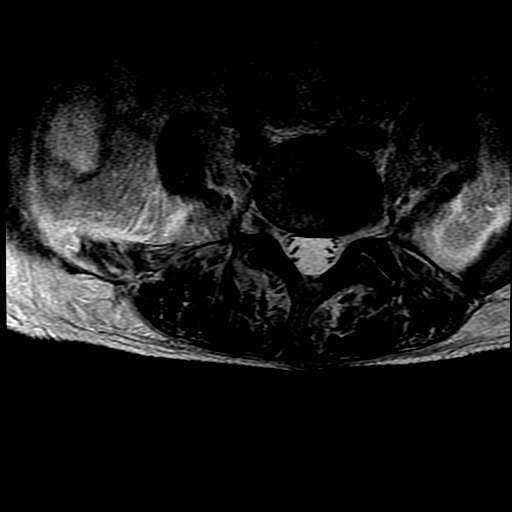
[im 16/31]
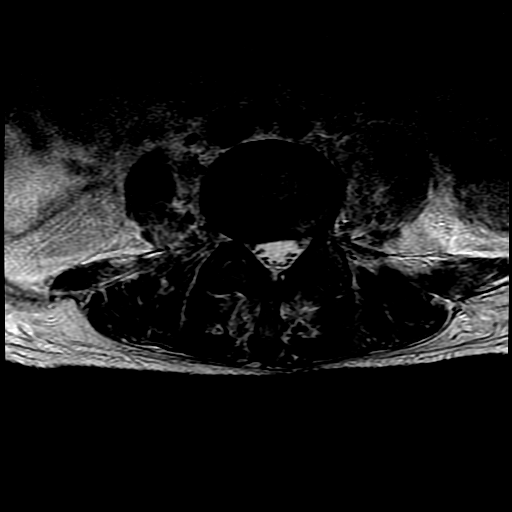
[im 26/31]
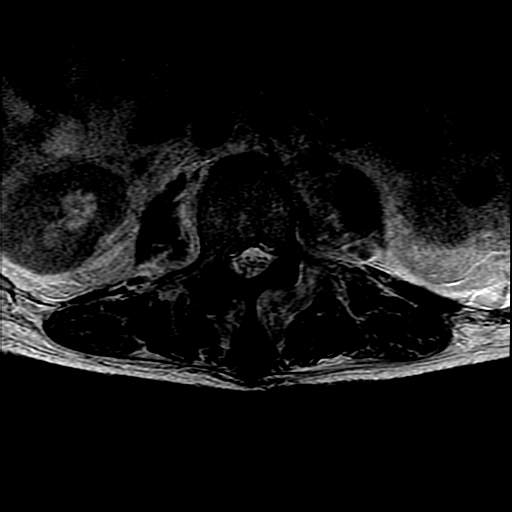

[Series 8: T1 · axial · 4.0mm · 0.43mm/px · z∈[+24,+154]mm · 3 of 31 slices shown (2 of 2)]
[im 5/31]
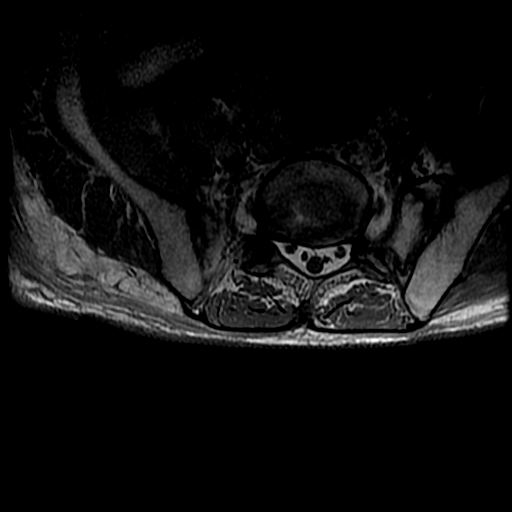
[im 16/31]
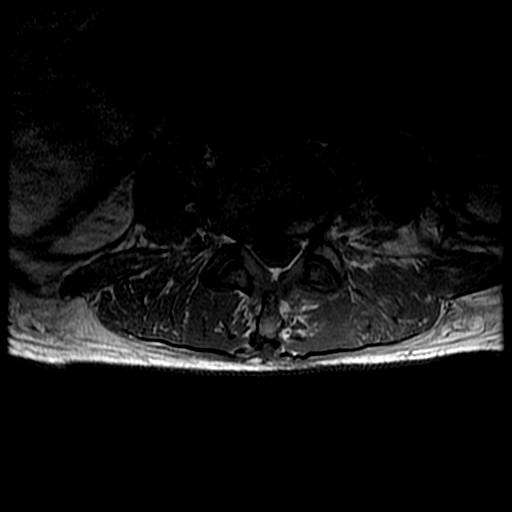
[im 26/31]
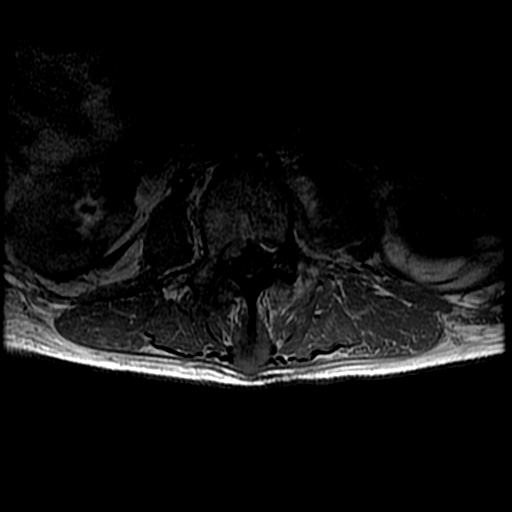

[18 of 48 positions shown; findings below may reference images not displayed]

FINDINGS: Segmentation: Transitional lumbosacral anatomy with partial
sacralization of L5 on the left.

Alignment:  Physiologic.

Vertebrae: Abnormal fluid within the L2-L3 disc space with
surrounding endplate irregularity and marrow edema, consistent with
osteomyelitis discitis. Small amount of anterior epidural phlegmon
at L2 and L3 without fluid collection. No fracture or suspicious
bone lesion.

Conus medullaris and cauda equina: Conus extends to the L1-L2 level.
Conus and cauda equina appear normal.

Paraspinal and other soft tissues: Left-greater-than-right psoas
muscle edema with tiny foci gas. No drainable fluid collection.
Chronically distended, trabeculated bladder again noted.

Disc levels:

T12-L1:  Negative.

L1-L2: Mild disc bulging and bilateral facet arthropathy. No
stenosis.

L2-L3: Osteomyelitis discitis. Mild disc bulging and bilateral facet
arthropathy. Mild spinal canal and bilateral neuroforaminal
stenosis.

L3-L4: Mild disc bulging and bilateral facet arthropathy. Mild
bilateral neuroforaminal stenosis. No spinal canal stenosis.

L4-L5: Mild disc bulging with small left paracentral annular
fissure. Mild bilateral facet arthropathy. Mild right neuroforaminal
stenosis. No spinal canal or left neuroforaminal stenosis.

L5-S1: No significant disc bulge or herniation. Bridging foraminal
endplate osteophytes. No stenosis.
IMPRESSION: 1. Osteomyelitis discitis at L2-L3 with small anterior epidural
phlegmon. No discrete abscess.
2. Left-greater-than-right psoas muscle edema with tiny foci of gas.
No drainable fluid collection.
3. Mild multilevel lumbar spondylosis as described above.

## 2021-11-03 IMAGING — MR MR SACRUM / SI JOINTS WO CM
4 of 5 series · 19 of 48 positions shown · non-contrast
Comparison: CT [DATE]

CLINICAL DATA: Low back pain, infection or inflammation suspected
(Ped 0-17y)

EXAM:
MRI SACRUM WITHOUT CONTRAST
TECHNIQUE: Multiplanar, multisequence MR imaging of the sacrum was performed.
No intravenous contrast was administered.

[Series 4: T1 · axial · 4.0mm · 0.47mm/px · z∈[-161,-23]mm · 8 of 31 slices shown (1 of 2)]
[im 1/31]
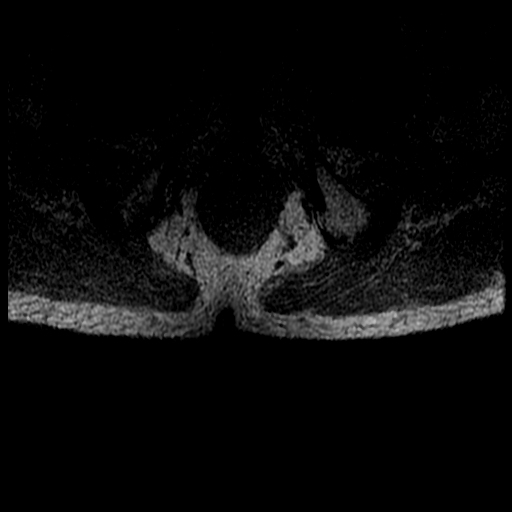
[im 4/31]
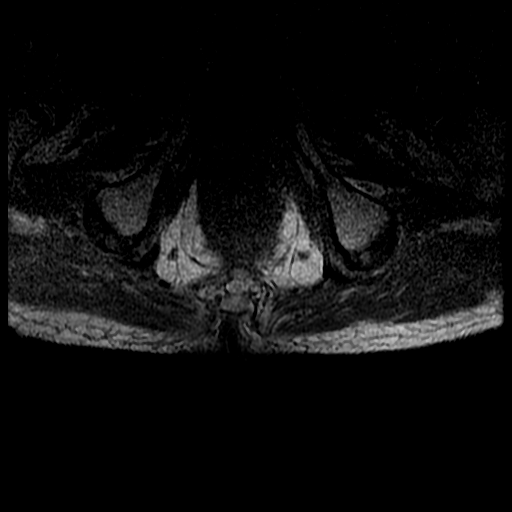
[im 11/31]
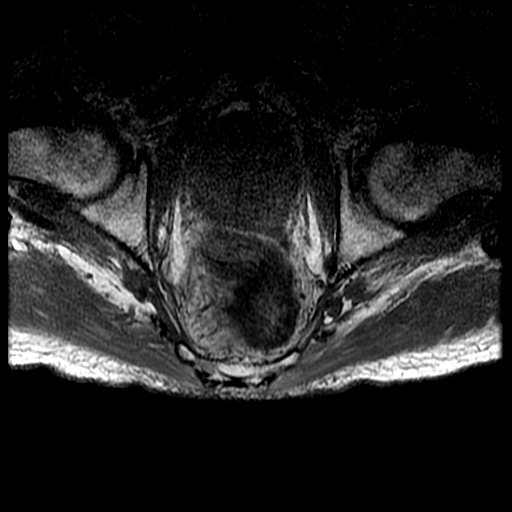
[im 14/31]
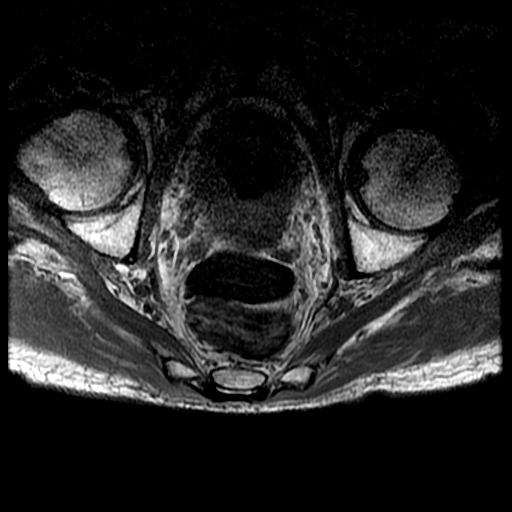
[im 17/31]
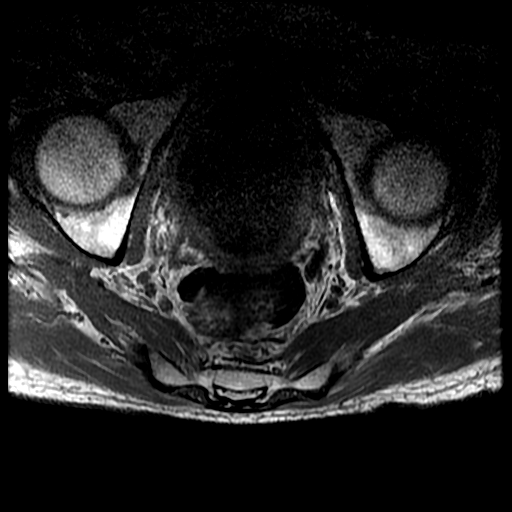
[im 21/31]
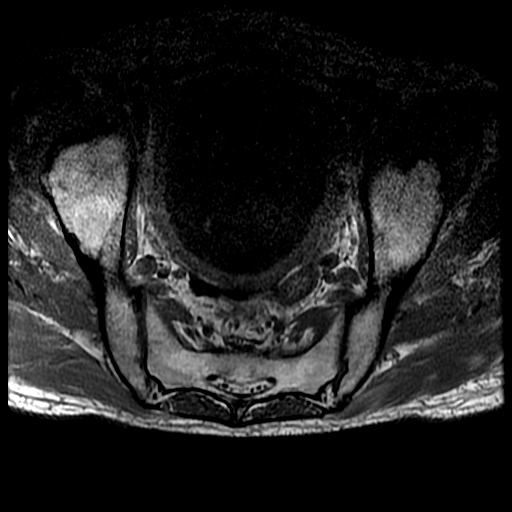
[im 27/31]
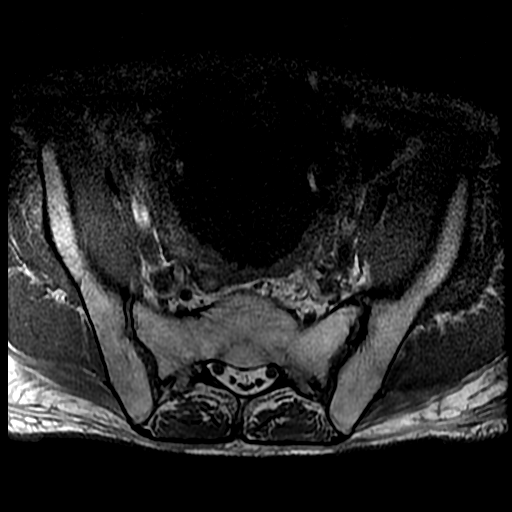
[im 31/31]
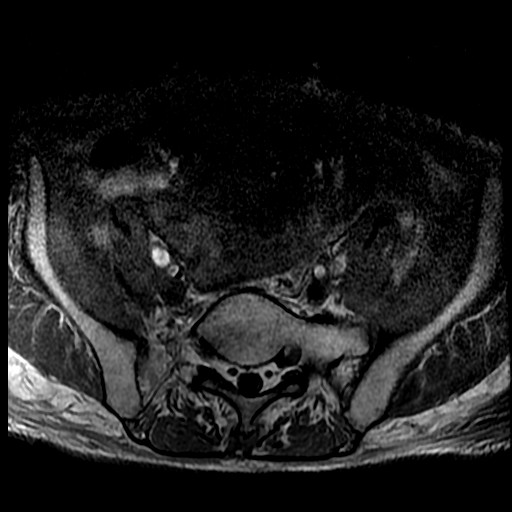

[Series 5: T2 fat-sat · axial · 4.0mm · 0.49mm/px · z∈[-163,-39]mm · 5 of 31 slices shown]
[im 1/31]
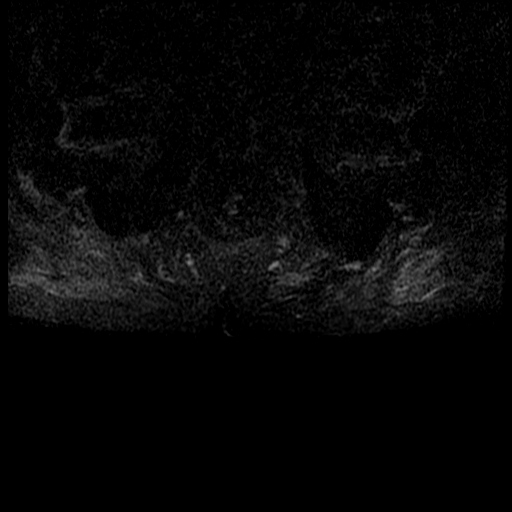
[im 4/31]
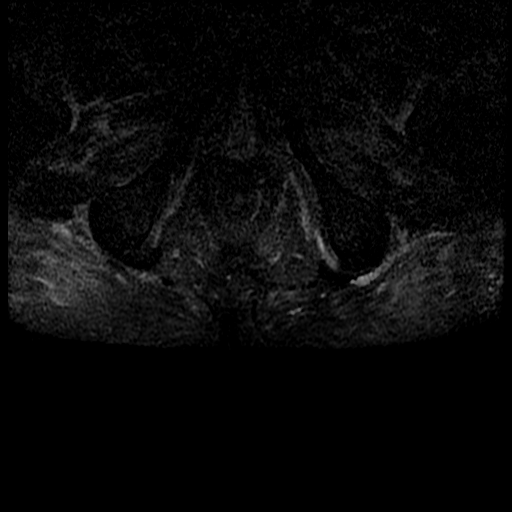
[im 7/31]
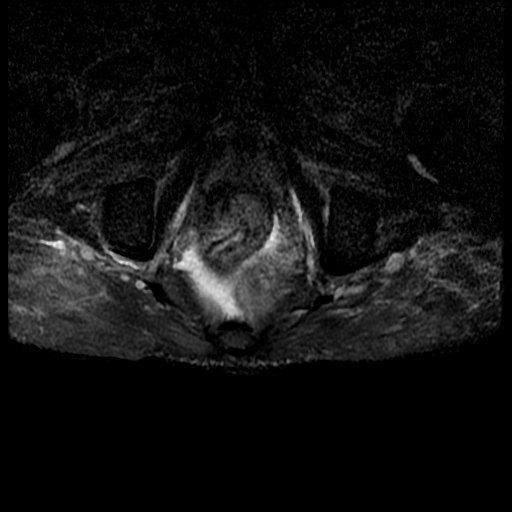
[im 16/31]
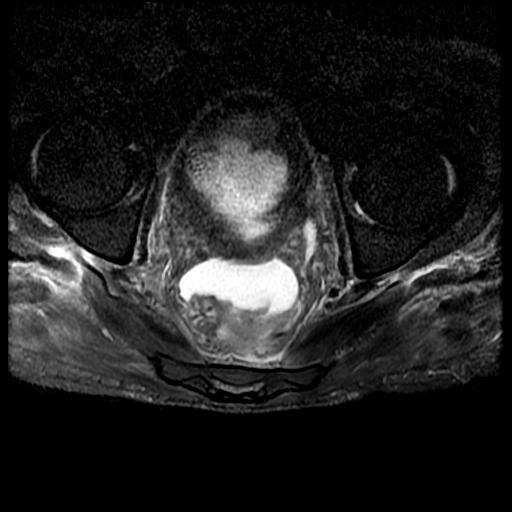
[im 28/31]
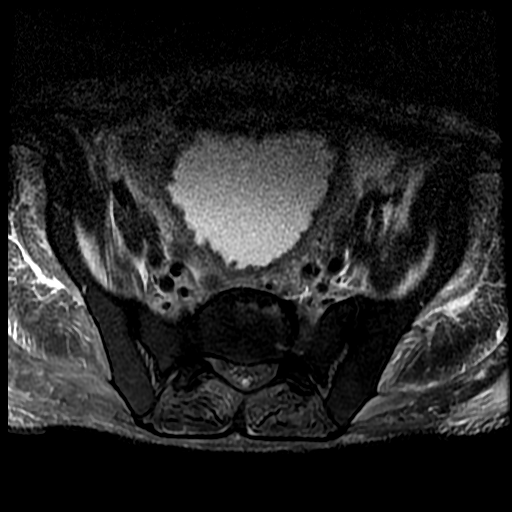

[Series 6: STIR · coronal · 3.0mm · 0.49mm/px · 3 of 26 slices shown]
[im 4/26]
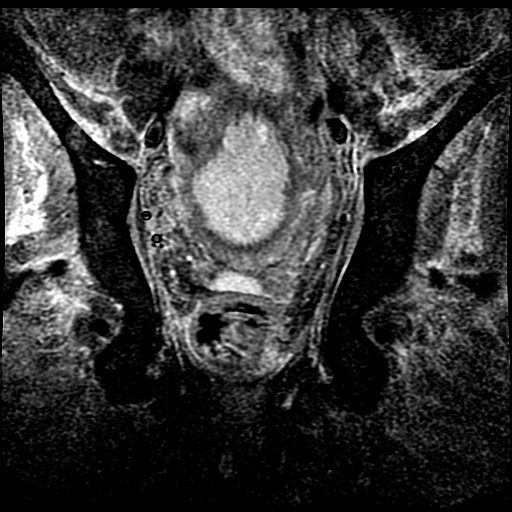
[im 13/26]
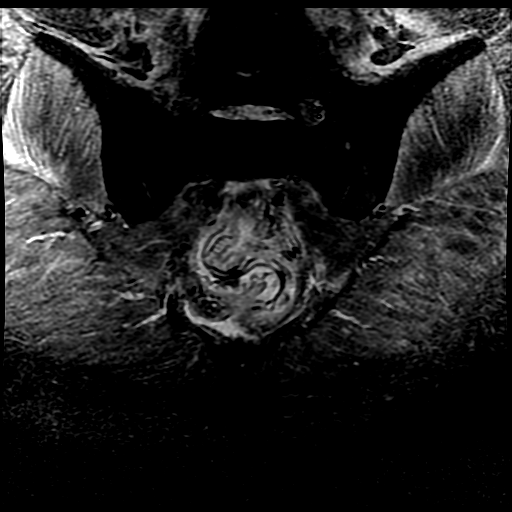
[im 22/26]
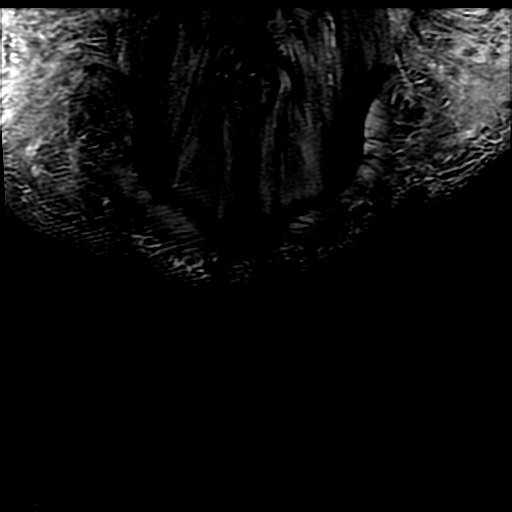

[Series 7: T1 · coronal · 3.0mm · 0.49mm/px · 3 of 26 slices shown (2 of 2)]
[im 4/26]
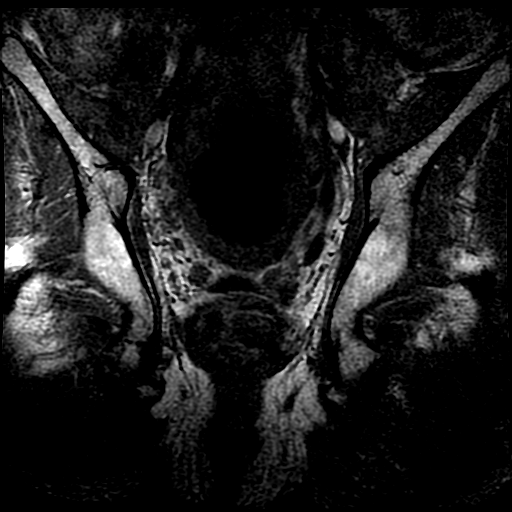
[im 13/26]
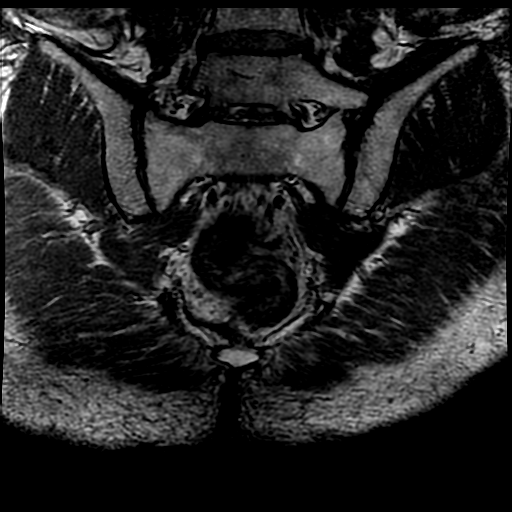
[im 22/26]
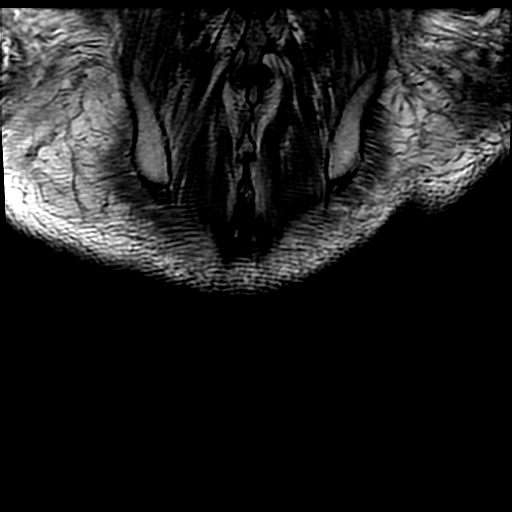

[19 of 48 positions shown; findings below may reference images not displayed]

FINDINGS: Bones/Joint/Cartilage

Sacrum and coccyx intact. No fracture. Bilateral SI joints are
normal in appearance. No SI joint effusion. No erosion. Visualized
pelvis is intact. Trace bilateral hip joint effusions. No femoral
head avascular necrosis.

Ligaments

Intact.

Muscles and Tendons

Extensive intramuscular edema involving the included iliopsoas and
bilateral gluteal musculature. No organized or drainable
intramuscular fluid collections. Included tendinous structures
appear intact.

Soft tissues

Generalized anasarca. Small volume layering free fluid within the
pelvis. Chronically thick-walled, trabeculated urinary bladder.
IMPRESSION: 1. No acute osseous abnormality of the sacrum. No evidence of
sacroiliitis.
2. Extensive intramuscular edema involving the included iliopsoas
and bilateral gluteal musculature, suspicious for myositis. No
organized or drainable intramuscular fluid collections.
3. Trace bilateral hip joint effusions, nonspecific and likely
reactive.
4. Generalized anasarca.
5. Chronically thick-walled, trabeculated urinary bladder.

## 2021-11-03 MED ORDER — PANTOPRAZOLE SODIUM 40 MG PO TBEC
40.0000 mg | DELAYED_RELEASE_TABLET | Freq: Every day | ORAL | Status: DC
  Administered 2021-11-03 – 2021-12-04 (×32): 40 mg via ORAL
  Filled 2021-11-03 (×31): qty 1

## 2021-11-03 MED ORDER — KETOTIFEN FUMARATE 0.025 % OP SOLN
1.0000 [drp] | Freq: Two times a day (BID) | OPHTHALMIC | Status: DC
  Administered 2021-11-03 – 2021-12-07 (×67): 1 [drp] via OPHTHALMIC
  Filled 2021-11-03 (×3): qty 5

## 2021-11-03 MED ORDER — POTASSIUM CHLORIDE CRYS ER 20 MEQ PO TBCR
40.0000 meq | EXTENDED_RELEASE_TABLET | Freq: Once | ORAL | Status: AC
  Administered 2021-11-03: 40 meq via ORAL
  Filled 2021-11-03: qty 2

## 2021-11-03 MED ORDER — INSULIN DETEMIR 100 UNIT/ML ~~LOC~~ SOLN
5.0000 [IU] | Freq: Two times a day (BID) | SUBCUTANEOUS | Status: DC
  Filled 2021-11-03 (×2): qty 0.05

## 2021-11-03 MED ORDER — NOREPINEPHRINE 4 MG/250ML-% IV SOLN
0.0000 ug/min | INTRAVENOUS | Status: DC
  Administered 2021-11-03: 2 ug/min via INTRAVENOUS

## 2021-11-03 MED ORDER — ARTIFICIAL TEARS OPHTHALMIC OINT
TOPICAL_OINTMENT | Freq: Every day | OPHTHALMIC | Status: DC
  Administered 2021-11-05 – 2021-11-06 (×2): 1 via OPHTHALMIC
  Filled 2021-11-03: qty 3.5

## 2021-11-03 MED ORDER — OXYCODONE HCL 5 MG PO TABS
5.0000 mg | ORAL_TABLET | Freq: Three times a day (TID) | ORAL | Status: DC | PRN
  Administered 2021-11-03 – 2021-11-10 (×16): 5 mg via ORAL
  Filled 2021-11-03 (×18): qty 1

## 2021-11-03 MED ORDER — INSULIN DETEMIR 100 UNIT/ML ~~LOC~~ SOLN
5.0000 [IU] | Freq: Every day | SUBCUTANEOUS | Status: DC
  Administered 2021-11-03: 5 [IU] via SUBCUTANEOUS
  Filled 2021-11-03 (×2): qty 0.05

## 2021-11-03 MED ORDER — FINASTERIDE 5 MG PO TABS
5.0000 mg | ORAL_TABLET | Freq: Every day | ORAL | Status: DC
  Administered 2021-11-03 – 2021-12-04 (×32): 5 mg via ORAL
  Filled 2021-11-03 (×32): qty 1

## 2021-11-03 MED ORDER — LATANOPROST 0.005 % OP SOLN
1.0000 [drp] | Freq: Every day | OPHTHALMIC | Status: DC
  Administered 2021-11-03 – 2021-12-07 (×35): 1 [drp] via OPHTHALMIC
  Filled 2021-11-03 (×3): qty 2.5

## 2021-11-03 MED ORDER — POLYVINYL ALCOHOL 1.4 % OP SOLN
1.0000 [drp] | Freq: Three times a day (TID) | OPHTHALMIC | Status: DC | PRN
  Administered 2021-11-03 – 2021-12-06 (×21): 1 [drp] via OPHTHALMIC

## 2021-11-03 MED ORDER — POTASSIUM CHLORIDE CRYS ER 20 MEQ PO TBCR
20.0000 meq | EXTENDED_RELEASE_TABLET | Freq: Once | ORAL | Status: AC
  Administered 2021-11-03: 20 meq via ORAL
  Filled 2021-11-03: qty 1

## 2021-11-03 NOTE — Progress Notes (Signed)
NAME:  Glenn [name], MRN:  [id number], DOB:  [birth year], LOS: 2 ADMISSION DATE:  10/26/2021, CONSULTATION DATE:  10/11/2021 REFERRING MD:  [facility] EDP, CHIEF COMPLAINT:  generally weak   History of Present Illness:  81 yo male with pmh of iddm with hyperglycemia, chf with ppm in place, cad, prostatomegaly presented to [facility] after not feeling well, generally weak x 3 days, and 2 days of vomiting/ poor PO intake after mechanical fall 3 days ago hitting his back and buttocks, since with progressive difficulty with ambulation.  Denies fever, chills, nausea, dirrhea. No CP, dizziness, ha, sob. No other associated symptoms.   Found to be afebrile and hypotensive at OSH, initially fluid responsive but ended up peripheral pressors.  Workup notable for UTI, AKI, transaminitis with elevated INR, hyponatremic, and imaging findings concerning for lumbar osteomyelitis vs discitis and left hydronephrosis with bladder outlet obstruction.  Foley placed with 1.7L UOP.  Transferred to [facility] given needs for MRI and determine PPM compatibility.   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  - [facility]> records limited   Significant Hospital Events: Including procedures, antibiotic start and stop dates in addition to other pertinent events   5/31>  transferred to [facility] from [facility] for MRI given PPM.  Urosepsis/ shock with r/o lumbar osteomyelitis/ discitis  6/1 >  CVL placed for pressor needs; abx narrowed to ctx for kleb bacteremia, ID on board     5/31 BC x2> 2/2 klebsiella pna 5/31 UC> multi species 5/31 MRSA PCR> neg  5/31 vanc/ cefepime/ flagyl/ azithro 5/31 ceftriaxone>    5/31 CT a/p wo>  1. Gas within the disc space at L2-3 and dissemination of gas into the LEFT psoas and RIGHT retroperitoneum as well as potentially in the epidural space posterior to L2. Findings raise the question of discitis/osteomyelitis with gas-forming organism.  Sampling of this area or MRI as possible may be helpful for further evaluation. 2. Signs of marked urinary bladder distension compatible with bladder outlet obstruction with less collecting system dilation than on previous imaging. Correlate with signs of urinary tract infection and or urosepsis. 3. Generalized mesenteric and body wall edema. 4. Cholelithiasis without evidence of acute cholecystitis. 5. Marked cardiomegaly with pacer defibrillator. Aortic Atherosclerosis  6/1 TEE > endomyocardial trabeculations noted but does not meet criteria for left ventricular non-compaction, LVEF < 20%, no WMA, G2DD, RV normal, est RA 3  Interim History / Subjective:  Several loose stools overnight> abd exam benign Episode of hypoglycemia overnight Afebrile On NE 99mg and vaso Only complaints of lower back pain  Objective   Blood pressure (!) 93/55, pulse 74, temperature (!) 97.5 F (36.4 C), temperature source Oral, resp. rate 17, weight 63.2 kg, SpO2 96 %.        Intake/Output Summary (Last 24 hours) at 11/03/2021 0817 Last data filed at 11/03/2021 0600 Gross per 24 hour  Intake 2244.34 ml  Output 1426 ml  Net 818.34 ml   Filed Weights   10/27/2021 2333 11/03/21 0600  Weight: 59.8 kg 63.2 kg    Examination: General:  Thin elderly male sitting up in bed eating breakfast in NAD HEENT: MM pink/moist Neuro:  Aox3, MAE, no focal deficits, generalized weakness CV: vpaced, no murmur PULM:  non labored, clear anteriorly, slight rales left posterior GI: soft, bsx4 active  Extremities: warm/dry, no LE edema  Skin: no rashes, kerlex dressing to left foot, abrasion left shin  BG 42-191 UOP  1.4L/ 24hrs 4 stools overnight Net +358 ml Wts 59.8 (admit) > 63.2kg today Afebrile   Lab> Na 133, K 3, bicarb 21, sCr 2.56> 2.26, BUN 108> 98, LFTs up alk phos 216> 306, AST/ ALT 106/ 80> 203/146, normal t. Bili, WBC 24.6> 22.4, Hgb 12.9> 11.5, HCT 32, plt 89> 78, INR 2.6> 1.7  Resolved Hospital Problem  list     Assessment & Plan:  Septic shock 2/2 UTI and Klebsiella bacteremia   - also need to rule out lumbar osteomyelitis/ discitis and r/o cardiac component/ decompensated HF given unknown baseline  P:  - tele monitoring - stop vaso.  Cont to wean NE for MAP goal > 65.  Pressor requirements weaning - UC multi species - check coox and trend CVP.  Less likely based on exam as he is warm distally and perfused but given TEE and LFTs, concern for decompensated HF.  Stop MIVF.   - continue CVL for now - ID following, cont ceftriaxone  - MRI lumbar hopefully today - TTE as above  Presumed NSVT, frequent ectopy P: - PPM evaluated by EP 6/1> PPM is [employer] CRT-D which IS MRI compatible - telemetry reviewed> events presumed NSVT are his RV paced beats but he is primarily LV paced.  Amiodarone stopped 6/1 (briefly on).   - continue telemetry monitoring  AKI 2/2 septic shock and urinary retention/ bladder outlet obstruction  Left hydronephrosis likely secondary to urinary retention Hyponatremia- presumed hypovolemic hyponatremia NAGMA/ lactic acidosis  - daughter reports hx urinary retention and prior TURP x 3 in past but no issues over the last 1-2 year.  P:  - UOP stable and sCr continues to improve - d/c foley.  Monitor closely for retention.  Restart home proscar  - renal US> no hydronephrosis.  Likely initial L hydro seen 2/2 bladder outlet obstruction - Trend BMP / urinary output/ daily weights - Replace electrolytes as indicated - Avoid nephrotoxic agents, ensure adequate renal perfusion  ? Osteomyelitis/discitis, left psoas and right RTP abscess - initial CT showing gas within the disc space at L2-3 and dissemination of gas into the LEFT psoas and RIGHT retroperitoneum as well as potentially in the epidural space posterior to L2 - pending MRI lumbar and adding sacrum today - ID following - may need NSGY vs IR consult for drainage.  Continue to hold AC/ VTE  - continue  neurovascular checks q4  Transaminitis, likely secondary to shock Coagulopathy Thrombocytopenia - likely related to sepsis vs liver, normal platelets at beginning of month P:  - LFTs up, INR improving, t. Bili unremarkable.  Repeat CMET in am - hold lipitor - stable platelets   IDDM with Hyperglycemia P:  - A1c 10.6 - hypoglycemic overnight.  Remains on clear liquids for now.  Changed to AC/ HS SSI and levemir.  Will reduce levemir to 5 units daily and cont sensitive SSI.     H/o CHF,  EF < 20%, G2DD PPM- mostly Vpaced, at times is RV paced Afib on Eliquis  P:  - pending records from [facility].  Unclear baseline HF status.   - hold home meds coreg, entresto, torsemide given shock  - TTE as above  - per daughter, patient just had a DCCV for afib on 5/22.  Continue to hold home Eliquis given coagulapathy and pending MRI.  Remains mostly vpaced.     Diarrhea 6/1 overnight  P:  - 4 loose stools overnight.  Slight mesenteric edema noted on initial CT a/p.  Abd  exam is unremarkable and asymptomatic.  Monitor for now.    Microcytic Anemia Hx IDA on iron infusions P: - trend CBC.  No evidence of bleeding.  Transfuse < 7  Best Practice (right click and "Reselect all SmartList Selections" daily)   Diet/type: clear liquids until MRI DVT prophylaxis: SCD, reported heparin allergy ("head to feel hot"); holding pending MRI GI prophylaxis: PPI Lines: Central line Foley:  Yes, and it is no longer needed Code Status:  full code Last date of multidisciplinary goals of care discussion 6/1 Daughter, Zavior Thomason [phone] updated by phone 6/1.  Pending update 6/2.     Labs   CBC: Recent Labs  Lab 10/13/2021 1628 11/02/21 0119 11/03/21 0330  WBC 24.7* 24.6* 22.4*  HGB 11.2* 12.9* 11.5*  HCT 34.2* 38.4* 32.2*  MCV 83.0 84.2 78.7*  PLT 104* 89* 78*    Basic Metabolic Panel: Recent Labs  Lab 10/04/2021 1628 10/28/2021 1817 11/02/21 0119 11/02/21 1715 11/03/21 0330  NA  129* 129* 130* 131* 133*  K 3.9 3.8 4.0 3.8 3.0*  CL 94* 97* 97* 101 103  CO2 21* 19* 19* 20* 21*  GLUCOSE 416* 379* 344* 136* 75  BUN 115* 116* 114* 108* 98*  CREATININE 3.62* 3.16* 3.10* 2.56* 2.26*  CALCIUM 8.2* 8.0* 8.3* 8.1* 8.3*  MG  --   --  2.1  --  2.0  PHOS  --   --   --   --  2.7   GFR: Estimated Creatinine Clearance: 24.1 mL/min (A) (by C-G formula based on SCr of 2.26 mg/dL (H)). Recent Labs  Lab 10/07/2021 1628 10/07/2021 1817 11/02/21 0119 11/03/21 0330  WBC 24.7*  --  24.6* 22.4*  LATICACIDVEN  --  1.7 1.7  --     Liver Function Tests: Recent Labs  Lab 10/11/2021 1817 11/02/21 0119 11/03/21 0330  AST 74* 106* 203*  ALT 49* 80* 146*  ALKPHOS 164* 216* 306*  BILITOT 1.4* 1.0 0.9  PROT 5.8* 6.1* 5.7*  ALBUMIN 2.6* 2.5* 2.1*   No results for input(s): LIPASE, AMYLASE in the last 168 hours. No results for input(s): AMMONIA in the last 168 hours.  ABG    Component Value Date/Time   PHART 7.37 12/22/2014 2215   PCO2ART 35 12/22/2014 2215   PO2ART 98 12/22/2014 2215   HCO3 17.9 (L) 10/20/2021 1817   ACIDBASEDEF 8.7 (H) 10/11/2021 1817   O2SAT 82 10/18/2021 1817     Coagulation Profile: Recent Labs  Lab 10/29/2021 1817 11/02/21 0119 11/03/21 0330  INR 3.1* 2.6* 1.7*    Cardiac Enzymes: No results for input(s): CKTOTAL, CKMB, CKMBINDEX, TROPONINI in the last 168 hours.  HbA1C: Hgb A1c MFr Bld  Date/Time Value Ref Range Status  11/02/2021 01:19 AM 10.6 (H) 4.8 - 5.6 % Final    Comment:    (NOTE) Pre diabetes:          5.7%-6.4%  Diabetes:              >6.4%  Glycemic control for   <7.0% adults with diabetes     CBG: Recent Labs  Lab 11/02/21 1511 11/02/21 1937 11/02/21 2321 11/03/21 0712 11/03/21 0713  GLUCAP 194* 191* 125* 43* 53*     Critical care time:  35 mins     Kennieth Rad, ACNP [facility] 11/03/2021, 8:17 AM  See Amion for pager If no response to pager, please call PCCM consult pager After  7:00 pm call Elink

## 2021-11-03 NOTE — TOC Progression Note (Signed)
Transition of Care [facility]) - Initial/Assessment Note    Patient Details  Name: Glenn [name] MRN: [id number] Date of Birth: 08-19-1940  Transition [facility] CM/SW Contact:    Ralene Bathe, LCSWA Phone Number: 11/03/2021, 12:40 PM  Clinical Narrative:                 Patient from home with daughter admitted for Septic shock.  Transition of Care Department [facility]) has reviewed patient and no TOC needs have been identified at this time. We will continue to monitor patient advancement through interdisciplinary progression rounds. If new patient transition needs arise, please place a TOC consult.   Patient Goals and CMS Choice        Expected Discharge Plan and Services                                                Prior Living Arrangements/Services                       Activities of Daily Living      Permission Sought/Granted                  Emotional Assessment              Admission diagnosis:  Septic shock (HCC) [A41.9, R65.21] Patient Active Problem List   Diagnosis Date Noted   Septic shock (HCC) 11/02/2021   Bacteremia due to Klebsiella pneumoniae 11/02/2021   Acute kidney injury (HCC) 11/02/2021   Bladder outlet obstruction 11/02/2021   Uncontrolled diabetes mellitus with hyperglycemia (HCC) 11/02/2021   Pacemaker 11/02/2021   PCP:  System, Provider Not In Pharmacy:   [facility] [address] [place] [address] Phone: [phone] Fax: [phone]  [facility] [place], [place] - [address] [place] [address] Phone: [phone] Fax: [phone]     Social Determinants of Health (SDOH) Interventions    Readmission Risk Interventions     View : No data to display.

## 2021-11-03 NOTE — Progress Notes (Signed)
Programmed device with [place] Rep, Joey and with Otilio Saber, PA aware. Device programmed to DOO 90, tachy therapies off. Will program back to regular settings post MRI.

## 2021-11-03 NOTE — Progress Notes (Signed)
Inpatient Diabetes Program Recommendations  AACE/ADA: New Consensus Statement on Inpatient Glycemic Control (2015)  Target Ranges:  Prepandial:   less than 140 mg/dL      Peak postprandial:   less than 180 mg/dL (1-2 hours)      Critically ill patients:  140 - 180 mg/dL   Lab Results  Component Value Date   GLUCAP 99 11/03/2021   HGBA1C 10.6 (H) 11/02/2021    Review of Glycemic Control  Diabetes history: Type 2 DM Outpatient Diabetes medications: Victoza 1.8 mg QD, Novolog 70/30 10-25 units QD, metformin 500 mg BID Current orders for Inpatient glycemic control: Novolog 0-9 units TID & HS, Levemir 5 units QD  Inpatient Diabetes Program Recommendations:    Noted AM hypoglycemia this AM followed by subsequent insulin adjustments. In agreement with changes.   Spoke with patient regarding outpatient diabetes management.  Is followed by the [facility] for DM management. Recently, saw PCP and asked for insulin; provider refused and ordered to continue Metformin and Victoza. Patent noted his blood sugars continued to increase and personally added 70/30 once per day with dinner for the last few weeks.  Reviewed patient's current A1c of 10.6%. Explained what a A1c is and what it measures. Also reviewed goal A1c with patient, importance of good glucose control @ home, and blood sugar goals. Reviewed patho of DM, need for insulin outpatient, impact of glucose trends with infection, vascular changes and commorbidities.  Patient has a meter and supplies. Reports checking two times per day and that values have all exceeded 160 mg/dL with occasional outliers in the 300's mg/dL. He states, "that when I knew I had to do something." Denies hypoglycemia at home. Denies drinking sugary beverages and reports continue mindfulness.   Discussed 70/30 peak of action, how to give, dosing differences and differences between short acting vs NPH. Encouraged to follow under guidance of PCP and discussed A1C goals per ADA  standards for age range. Would anticipate patient may require small dosing of 70/30 at discharge. Plans to follow up with PCP post hospitalization. Has no further questions at this time.  Will follow.  Thanks, Bronson Curb, MSN, RNC-OB Diabetes Coordinator [phone] (8a-5p)

## 2021-11-03 NOTE — Progress Notes (Signed)
11/03/2021 APP Kennieth Rad and I saw and evaluated the patient. Discussed with them and agree with their findings and plan as documented in the their note.  I have seen and evaluated the patient for shock  S:  Waiting on MRI. Pressor requirement has stabilized.   O: Blood pressure (!) 93/55, pulse 74, temperature (!) 97.5 F (36.4 C), temperature source Oral, resp. rate 17, weight 63.2 kg, SpO2 96 %.   Exam: Gen:       No acute distress HEENT:  tracking, sclera anicteric Lungs:    Clear to auscultation bilaterally; normal respiratory effort CV:         RRR; no murmurs Abd:      + bowel sounds; soft, non-tender; no palpable masses, no distension Ext:    1+ dependent edema; lukewarm Neuro:    follows commands, can wiggle toes  Labs/imaging reviewed by me:  LFTs rising BUN/S Cr trending down WBC 22.4 PLT 78  BCx 5/31 with GNRs BCID 5/31 klebsiella pneumonia  No new imaging  A:  # Shock, suspected septic due to klebsiella BSI, UTI, possible discitis/abscesses, possible cardiogenic component # AKI due to obstructive uropathy, sepsis # Severely reduced LV systolic function, grade 2 diastolic dysfunction # Transaminitis - sepsis, shock/congestion alternatively (IVC appears full without resp variation) # Thrombocytopenia # Uncontrolled DM # CAD S/p CABG # Deconditioning  P:  - check coox, trend LFTs - can dc vaso - wean levo for map 65 - continue ABX - will talk with him about heparin allergy and consider starting at least ppx dose - f/u MRI - trend LFTs - PT/OT  Patient critically ill due to shock Interventions to address this today oversight of vasopressor titration Risk of deterioration without these interventions is high  I personally spent 36 minutes providing critical care not including any separately billable procedures   Glenn [name]

## 2021-11-03 NOTE — Progress Notes (Signed)
[facility]  Date of Admission:  10/23/2021           Reason for visit: Follow up on Bacteremia                                      Current antibiotics: Ceftriaxone   ASSESSMENT:    81 y.o. male admitted with:  Klebsiella pneumonia bacteremia: Secondary to suspected urinary source with history of an enlarged prostate and bladder outlet obstruction.  CT concerning for discitis/OM, possible abscesses in the epidural space, left psoas, and right retroperitoneum.  Suspect UTI leading to lumbar epidural disease, abscesses.  MRI pending.  Septic shock: Due to #1.  Pressor requirement stabilizing. History of enlarged prostate and kidney stones Acute kidney injury:  Presented with creatinine of 3.62.  Suspect secondary to sepsis and outlet obstruction.  Improving.  Uncontrolled DM: A1c is 10.6. 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  Elevated LFTs: Likely due to sepsis.  RECOMMENDATIONS:    Continue ceftriaxone MRI pending.  AKI limits ability to obtain with contrast Findings on MRI will determine need for possible NSGY consult vs IR (or both) Lab monitoring Glycemic control Will check hepatitis serology for completeness Discussed with CCM Will follow.  Dr Candiss Norse available as needed over the weekend.  New ID team will take over on Monday (either Dr Candiss Norse or Dr Baxter Flattery).   Principal Problem:   Bacteremia due to Klebsiella pneumoniae Active Problems:   Septic shock (HCC)   Acute kidney injury ([employer])   Bladder outlet obstruction   Uncontrolled diabetes mellitus with hyperglycemia (HCC)   Pacemaker    MEDICATIONS:    Scheduled Meds:  aspirin  81 mg Oral Daily   atorvastatin  40 mg Oral Daily   Chlorhexidine Gluconate Cloth  6 each Topical Q0600   insulin aspart  0-9 Units Subcutaneous TID PC & HS   insulin detemir  5 Units Subcutaneous BID   potassium chloride  20 mEq Oral Once   sodium chloride flush  10-40 mL  Intracatheter Q12H   Continuous Infusions:  sodium chloride 250 mL (11/02/21 0205)   sodium chloride     cefTRIAXone (ROCEPHIN)  IV 2 g (11/02/21 2211)   norepinephrine (LEVOPHED) Adult infusion 2 mcg/min (11/03/21 0600)   vasopressin 0.03 Units/min (11/03/21 0600)   PRN Meds:.fentaNYL (SUBLIMAZE) injection, polyvinyl alcohol, sodium chloride flush  SUBJECTIVE:   24 hour events:  No acute events appear noted WBC stable Platelets stable LFTs rising Central line right IJ placed due to pressor requirement Tmax 98.1 Creatinine improved MRI is pending  No new complaints.  Sitting up in bed.  States he is glad that he was able to get some food.   Review of Systems  All other systems reviewed and are negative.    OBJECTIVE:   Blood pressure (!) 93/55, pulse 74, temperature (!) 97.5 F (36.4 C), temperature source Oral, resp. rate 17, weight 63.2 kg, SpO2 96 %. Body mass index is 19.99 kg/m.  Physical Exam Constitutional:      General: He is not in acute distress.    Appearance: Normal appearance.  HENT:     Head: Normocephalic and atraumatic.  Eyes:     Extraocular Movements: Extraocular movements intact.     Conjunctiva/sclera: Conjunctivae normal.  Neck:     Comments: Right IJ CVC in place.  Pulmonary:  Effort: Pulmonary effort is normal. No respiratory distress.  Abdominal:     General: There is no distension.     Palpations: Abdomen is soft.     Tenderness: There is no abdominal tenderness.  Musculoskeletal:     Cervical back: Normal range of motion and neck supple.     Right lower leg: No edema.     Left lower leg: No edema.  Skin:    General: Skin is warm and dry.     Findings: No rash.  Neurological:     General: No focal deficit present.     Mental Status: He is alert and oriented to person, place, and time.  Psychiatric:        Mood and Affect: Mood normal.        Behavior: Behavior normal.     Lab Results: Lab Results  Component Value Date    WBC 22.4 (H) 11/03/2021   HGB 11.5 (L) 11/03/2021   HCT 32.2 (L) 11/03/2021   MCV 78.7 (L) 11/03/2021   PLT 78 (L) 11/03/2021    Lab Results  Component Value Date   NA 133 (L) 11/03/2021   K 3.0 (L) 11/03/2021   CO2 21 (L) 11/03/2021   GLUCOSE 75 11/03/2021   BUN 98 (H) 11/03/2021   CREATININE 2.26 (H) 11/03/2021   CALCIUM 8.3 (L) 11/03/2021   GFRNONAA 29 (L) 11/03/2021   GFRAA >60 09/04/2016    Lab Results  Component Value Date   ALT 146 (H) 11/03/2021   AST 203 (H) 11/03/2021   ALKPHOS 306 (H) 11/03/2021   BILITOT 0.9 11/03/2021    No results found for: CRP  No results found for: ESRSEDRATE   I have reviewed the micro and lab results in Epic.  Imaging: CT ABDOMEN PELVIS WO CONTRAST  Result Date: 10/05/2021 CLINICAL DATA:  A 81 year old male presents with blunt abdominal trauma by report. Question of sepsis and weakness, also potentially hit lower back and having pain in the buttocks. EXAM: CT ABDOMEN AND PELVIS WITHOUT CONTRAST TECHNIQUE: Multidetector CT imaging of the abdomen and pelvis was performed following the standard protocol without IV contrast. RADIATION DOSE REDUCTION: This exam was performed according to the departmental dose-optimization program which includes automated exposure control, adjustment of the mA and/or kV according to patient size and/or use of iterative reconstruction technique. COMPARISON:  December 21, 2016. FINDINGS: Lower chest: Basilar atelectasis and or scarring at the lung bases. Heart size is enlarged as before. Signs of multi lead pacer device in prior percutaneous coronary intervention as well as sternotomy. Hepatobiliary: Liver with smooth contours. Gallbladder with similar degree of moderate distension and cholelithiasis in the dependent portion the gallbladder. No gross pericholecystic stranding. Pancreas: No gross pancreatic inflammation, limited assessment due to lack of intravenous contrast. Spleen: Normal without signs of adjacent  stranding. Adrenals/Urinary Tract: Adrenal glands are normal. Mild LEFT ureteral dilation and mild LEFT hydronephrosis. Mild fullness of RIGHT renal pelvis and ureteral dilation in the setting of marked distension of the urinary bladder. Urinary bladder is not as distended as on the study of December 21, 2016 though still extends well into the abdomen, to approximately the L3 level on the sagittal reconstructions. Renal contours are similar to prior imaging. No marked perinephric stranding. Stomach/Bowel: No signs of bowel obstruction. Generalized mesenteric edema. The appendix is not visible, no focal signs about the cecum to suggest appendiceal inflammation. Vascular/Lymphatic: Aortic atherosclerosis. There is no gastrohepatic or hepatoduodenal ligament lymphadenopathy. No retroperitoneal or mesenteric lymphadenopathy. No pelvic sidewall  lymphadenopathy. Reproductive: Prostatomegaly as before. Other: Gas locules within the LEFT psoas muscle. Small gas locules adjacent to the IVC. Gas within the disc space at the L2-L3 level with new areas of irregularity and disc space narrowing along the L2-3 disc space. Small locules of gas in the epidural space posteriorly. Retroperitoneal gas is greatest on the LEFT within the LEFT psoas. No signs of fracture or traumatic subluxation in the spine. Phenomenon Musculoskeletal: As above mild body wall edema. IMPRESSION: 1. Gas within the disc space at L2-3 and dissemination of gas into the LEFT psoas and RIGHT retroperitoneum as well as potentially in the epidural space posterior to L2. Findings raise the question of discitis/osteomyelitis with gas-forming organism. Sampling of this area or MRI as possible may be helpful for further evaluation. 2. Signs of marked urinary bladder distension compatible with bladder outlet obstruction with less collecting system dilation than on previous imaging. Correlate with signs of urinary tract infection and or urosepsis. 3. Generalized mesenteric  and body wall edema. 4. Cholelithiasis without evidence of acute cholecystitis. 5. Marked cardiomegaly with pacer defibrillator. Aortic Atherosclerosis (ICD10-I70.0). Electronically Signed   By: Zetta Bills M.D.   On: 10/29/2021 18:17   CT HEAD WO CONTRAST (5MM)  Result Date: 10/18/2021 CLINICAL DATA:  Head trauma, moderate-severe EXAM: CT HEAD WITHOUT CONTRAST TECHNIQUE: Contiguous axial images were obtained from the base of the skull through the vertex without intravenous contrast. RADIATION DOSE REDUCTION: This exam was performed according to the departmental dose-optimization program which includes automated exposure control, adjustment of the mA and/or kV according to patient size and/or use of iterative reconstruction technique. COMPARISON:  None Available. BRAIN: BRAIN Cerebral ventricle sizes are concordant with the degree of cerebral volume loss. Patchy and confluent areas of decreased attenuation are noted throughout the deep and periventricular white matter of the cerebral hemispheres bilaterally, compatible with chronic microvascular ischemic disease. No evidence of large-territorial acute infarction. No parenchymal hemorrhage. No mass lesion. No extra-axial collection. No mass effect or midline shift. No hydrocephalus. Cavum septum pellucidum variant noted. Basilar cisterns are patent. Vascular: No hyperdense vessel. Atherosclerotic calcifications are present within the cavernous internal carotid arteries. Skull: No acute fracture or focal lesion. Sinuses/Orbits: Almost complete opacification of bilateral sphenoid sinuses. Hyperdense debris noted within the sphenoid sinuses. Paranasal sinuses and mastoid air cells are clear. Bilateral lens replacement. The orbits are unremarkable. Other: None. IMPRESSION: 1. No acute intracranial abnormality. 2. 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 Electronically Signed   By: Iven Finn M.D.   On:  10/26/2021 18:08   US RENAL  Result Date: 11/02/2021 CLINICAL DATA:  Acute kidney injury. EXAM: RENAL / URINARY TRACT ULTRASOUND COMPLETE COMPARISON:  Nov 01, 2021. FINDINGS: Right Kidney: Renal measurements: 11.7 x 5.6 x 4.4 cm = volume: 150 mL. Increased echogenicity of renal parenchyma is noted. No mass or hydronephrosis visualized. Left Kidney: Renal measurements: 13.3 x 6.3 x 5.3 cm = volume: 234 mL. Increased echogenicity of renal parenchyma is noted. 1 cm simple cyst is noted which requires no further follow-up. No mass or hydronephrosis visualized. Bladder: Foley catheter is noted. Moderate bladder wall thickening is noted which most likely is due to lack of distension. No ureteral jets are visualized. Other: Minimal amount of free fluid is noted around the liver. IMPRESSION: Increased echogenicity of renal parenchyma is noted suggesting medical renal disease. No hydronephrosis or renal obstruction is noted. Minimal ascites is noted around the liver. Electronically Signed   By: Jeneen Rinks  Murlean Caller M.D.   On: 11/02/2021 09:59   DG CHEST PORT 1 VIEW  Result Date: 11/02/2021 CLINICAL DATA:  Central line placement EXAM: PORTABLE CHEST 1 VIEW COMPARISON:  10/31/2021 FINDINGS: Interval placement of right IJ central line with distal tip terminating at the level of the mid right atrium. Left-sided implanted cardiac device. Stable heart size status post CABG. Chronic elevation of the right hemidiaphragm. Right basilar atelectasis. Previously seen nodular opacity in the right lung base is no longer evident. Left lung is clear. No pleural effusion. No pneumothorax. IMPRESSION: 1. Interval placement of right IJ central line with distal tip terminating at the level of the mid right atrium. No pneumothorax. 2. Chronic elevation of the right hemidiaphragm with right basilar atelectasis. 3. Previously described nodular opacity at the right lung base is no longer evident, compatible with a focus of atelectasis.  Electronically Signed   By: Davina Poke D.O.   On: 11/02/2021 15:49   DG Chest Port 1 View  Result Date: 10/17/2021 CLINICAL DATA:  Possible sepsis EXAM: PORTABLE CHEST 1 VIEW COMPARISON:  09/04/2016 FINDINGS: Left-sided implanted cardiac device. Heart size within normal limits status post median sternotomy and CABG. Aortic atherosclerosis. Low lung volumes. Small focal somewhat nodular opacity at the right lung base. No pleural effusion or pneumothorax. IMPRESSION: Small focal opacity at the right lung base. Findings could represent atelectasis, infection, versus pulmonary nodule. Recommend repeat inspiratory PA and lateral radiographs of the chest to see if this finding persists. Electronically Signed   By: Davina Poke D.O.   On: 10/28/2021 17:00   DG Foot Complete Left  Result Date: 10/31/2021 CLINICAL DATA:  Left foot wound. EXAM: LEFT FOOT - COMPLETE 3+ VIEW COMPARISON:  None Available. FINDINGS: There is severe great toe metatarsophalangeal joint space narrowing and peripheral osteophytosis with moderate subchondral sclerosis. Mild-to-moderate joint space narrowing of the interphalangeal joints diffusely. Small plantar calcaneal heel spur. Mild chronic spurring at the Achilles insertion on the calcaneus. Mild vascular calcifications. Mild soft tissue swelling/enlargement lateral to the fifth metatarsophalangeal joint with convex lateral border. No acute fracture or dislocation. IMPRESSION: 1. Severe great toe metatarsophalangeal joint osteoarthritis. 2. Mild soft tissue swelling/enlarged and lateral to the fifth metatarsophalangeal joint with convex lateral border. Recommend clinical correlation for reported wound versus possible mass. 3. Small plantar calcaneal heel spur. Electronically Signed   By: Yvonne Kendall M.D.   On: 10/08/2021 17:40   ECHOCARDIOGRAM COMPLETE  Result Date: 11/02/2021    ECHOCARDIOGRAM REPORT   Patient Name:   Hoby Coley Date of Exam: 11/02/2021 Medical Rec #:   [id number]      Height:       70.0 in Accession #:    [id number]     Weight:       131.8 lb Date of Birth:  November 28, 1940      BSA:          1.748 m Patient Age:    57 years       BP:           95/64 mmHg Patient Gender: M              HR:           84 bpm. Exam Location:  Inpatient Procedure: 2D Echo, Cardiac Doppler and Color Doppler Indications:    Elevated troponin  History:        Patient has no prior history of Echocardiogram examinations.  CHF, CAD, Pacemaker; Risk Factors:Diabetes and Hypertension.  Sonographer:    Joette Catching RCS Referring Phys: [id number] [place]  1. There are some endomyocardial trabeculations noted but does not meet criteria for left ventricular non-compaction. Left ventricular ejection fraction, by estimation, is <20%. The left ventricle has severely decreased function. The left ventricle has no regional wall motion abnormalities. Left ventricular diastolic parameters are consistent with Grade II diastolic dysfunction (pseudonormalization). There is the interventricular septum is flattened in systole and diastole, consistent with right ventricular pressure and volume overload.  2. Right ventricular systolic function is normal. The right ventricular size is normal.  3. The mitral valve is normal in structure. Mild mitral valve regurgitation. No evidence of mitral stenosis.  4. Tricuspid valve regurgitation is moderate.  5. The aortic valve is tricuspid. Aortic valve regurgitation is not visualized. No aortic stenosis is present.  6. There is borderline dilatation of the aortic root, measuring 37 mm.  7. The inferior vena cava is normal in size with greater than 50% respiratory variability, suggesting right atrial pressure of 3 mmHg. Comparison(s): No prior Echocardiogram. FINDINGS  Left Ventricle: There are some endomyocardial trabeculations noted but does not meet criteria for left ventricular non-compaction. Left ventricular ejection fraction, by  estimation, is <20%. The left ventricle has severely decreased function. The left ventricle has no regional wall motion abnormalities. The left ventricular internal cavity size was normal in size. There is no left ventricular hypertrophy. The interventricular septum is flattened in systole and diastole, consistent with right ventricular pressure and volume overload. Left ventricular diastolic parameters are consistent with Grade II diastolic dysfunction (pseudonormalization). Right Ventricle: The right ventricular size is normal. No increase in right ventricular wall thickness. Right ventricular systolic function is normal. Left Atrium: Left atrial size was normal in size. Right Atrium: Right atrial size was normal in size. Pericardium: There is no evidence of pericardial effusion. Mitral Valve: The mitral valve is normal in structure. Mild mitral valve regurgitation. No evidence of mitral valve stenosis. Tricuspid Valve: The tricuspid valve is normal in structure. Tricuspid valve regurgitation is moderate . No evidence of tricuspid stenosis. Aortic Valve: The aortic valve is tricuspid. Aortic valve regurgitation is not visualized. No aortic stenosis is present. Aortic valve mean gradient measures 2.0 mmHg. Aortic valve peak gradient measures 3.6 mmHg. Aortic valve area, by VTI measures 2.26 cm. Pulmonic Valve: The pulmonic valve was normal in structure. Pulmonic valve regurgitation is mild. No evidence of pulmonic stenosis. Aorta: There is borderline dilatation of the aortic root, measuring 37 mm. Venous: The inferior vena cava is normal in size with greater than 50% respiratory variability, suggesting right atrial pressure of 3 mmHg. IAS/Shunts: No atrial level shunt detected by color flow Doppler. Additional Comments: A device lead is visualized.  LEFT VENTRICLE PLAX 2D LVIDd:         4.90 cm      Diastology LVIDs:         4.80 cm      LV e' medial:    3.77 cm/s LV PW:         1.00 cm      LV E/e' medial:  16.0  LV IVS:        1.20 cm      LV e' lateral:   6.85 cm/s LVOT diam:     2.30 cm      LV E/e' lateral: 8.8 LV SV:         33 LV SV Index:  19 LVOT Area:     4.15 cm  LV Volumes (MOD) LV vol d, MOD A2C: 111.0 ml LV vol d, MOD A4C: 81.2 ml LV vol s, MOD A2C: 93.6 ml LV vol s, MOD A4C: 64.6 ml LV SV MOD A2C:     17.4 ml LV SV MOD A4C:     81.2 ml LV SV MOD BP:      16.5 ml RIGHT VENTRICLE            IVC RV Basal diam:  3.40 cm    IVC diam: 1.90 cm RV Mid diam:    2.10 cm RV S prime:     6.40 cm/s TAPSE (M-mode): 1.0 cm LEFT ATRIUM             Index        RIGHT ATRIUM           Index LA diam:        4.20 cm 2.40 cm/m   RA Area:     16.50 cm LA Vol (A2C):   68.5 ml 39.18 ml/m  RA Volume:   40.90 ml  23.39 ml/m LA Vol (A4C):   45.7 ml 26.14 ml/m LA Biplane Vol: 56.0 ml 32.03 ml/m  AORTIC VALVE                    PULMONIC VALVE AV Area (Vmax):    2.34 cm     PV Vmax:          0.63 m/s AV Area (Vmean):   2.15 cm     PV Peak grad:     1.6 mmHg AV Area (VTI):     2.26 cm     PR End Diast Vel: 10.24 msec AV Vmax:           95.40 cm/s AV Vmean:          75.700 cm/s AV VTI:            0.148 m AV Peak Grad:      3.6 mmHg AV Mean Grad:      2.0 mmHg LVOT Vmax:         53.80 cm/s LVOT Vmean:        39.100 cm/s LVOT VTI:          0.081 m LVOT/AV VTI ratio: 0.54  AORTA Ao Root diam: 3.70 cm Ao Asc diam:  3.30 cm MITRAL VALVE                  TRICUSPID VALVE MV Area (PHT): 8.25 cm       TR Peak grad:   32.9 mmHg MV Decel Time: 92 msec        TR Vmax:        287.00 cm/s MR Peak grad:    53.9 mmHg MR Mean grad:    33.0 mmHg    SHUNTS MR Vmax:         367.00 cm/s  Systemic VTI:  0.08 m MR Vmean:        273.0 cm/s   Systemic Diam: 2.30 cm MR PISA:         1.01 cm MR PISA Eff ROA: 10 mm MR PISA Radius:  0.40 cm MV E velocity: 60.40 cm/s MV A velocity: 63.00 cm/s MV E/A ratio:  0.96 Kardie Tobb DO Electronically signed by Berniece Salines DO Signature Date/Time: 11/02/2021/4:14:53 PM    Final      Imaging independently reviewed in  Epic.  [facility] for Infectious [facility] [phone] pager 11/03/2021, 8:00 AM

## 2021-11-04 ENCOUNTER — Inpatient Hospital Stay: Payer: Self-pay

## 2021-11-04 DIAGNOSIS — B961 Klebsiella pneumoniae [K. pneumoniae] as the cause of diseases classified elsewhere: Secondary | ICD-10-CM | POA: Diagnosis not present

## 2021-11-04 DIAGNOSIS — R7881 Bacteremia: Secondary | ICD-10-CM | POA: Diagnosis not present

## 2021-11-04 LAB — GLUCOSE, CAPILLARY
Glucose-Capillary: 47 mg/dL — ABNORMAL LOW (ref 70–99)
Glucose-Capillary: 59 mg/dL — ABNORMAL LOW (ref 70–99)
Glucose-Capillary: 127 mg/dL — ABNORMAL HIGH (ref 70–99)
Glucose-Capillary: 266 mg/dL — ABNORMAL HIGH (ref 70–99)
Glucose-Capillary: 335 mg/dL — ABNORMAL HIGH (ref 70–99)

## 2021-11-04 LAB — COOXEMETRY PANEL
Carboxyhemoglobin: 1.8 % — ABNORMAL HIGH (ref 0.5–1.5)
Methemoglobin: <0.7 % (ref 0.0–1.5)
O2 Saturation: 69.3 %
Total hemoglobin: 11.9 g/dL — ABNORMAL LOW (ref 12.0–16.0)

## 2021-11-04 LAB — CBC
HCT: 33.7 % — ABNORMAL LOW (ref 39.0–52.0)
Hemoglobin: 11.8 g/dL — ABNORMAL LOW (ref 13.0–17.0)
MCH: 27.5 pg (ref 26.0–34.0)
MCHC: 35 g/dL (ref 30.0–36.0)
MCV: 78.6 fL — ABNORMAL LOW (ref 80.0–100.0)
Platelets: 82 10*3/uL — ABNORMAL LOW (ref 150–400)
RBC: 4.29 MIL/uL (ref 4.22–5.81)
RDW: 15.8 % — ABNORMAL HIGH (ref 11.5–15.5)
WBC: 15.9 10*3/uL — ABNORMAL HIGH (ref 4.0–10.5)
nRBC: 0 % (ref 0.0–0.2)

## 2021-11-04 LAB — CULTURE, BLOOD (ROUTINE X 2): Special Requests: ADEQUATE

## 2021-11-04 LAB — HEPATITIS PANEL, ACUTE
HCV Ab: NONREACTIVE
Hep A IgM: NONREACTIVE
Hep B C IgM: NONREACTIVE
Hepatitis B Surface Ag: NONREACTIVE

## 2021-11-04 LAB — COMPREHENSIVE METABOLIC PANEL
ALT: 116 U/L — ABNORMAL HIGH (ref 0–44)
AST: 121 U/L — ABNORMAL HIGH (ref 15–41)
Albumin: 1.9 g/dL — ABNORMAL LOW (ref 3.5–5.0)
Alkaline Phosphatase: 281 U/L — ABNORMAL HIGH (ref 38–126)
Anion gap: 8 (ref 5–15)
BUN: 80 mg/dL — ABNORMAL HIGH (ref 8–23)
CO2: 22 mmol/L (ref 22–32)
Calcium: 8.6 mg/dL — ABNORMAL LOW (ref 8.9–10.3)
Chloride: 103 mmol/L (ref 98–111)
Creatinine, Ser: 1.8 mg/dL — ABNORMAL HIGH (ref 0.61–1.24)
GFR, Estimated: 38 mL/min — ABNORMAL LOW (ref >60)
Glucose, Bld: 56 mg/dL — ABNORMAL LOW (ref 70–99)
Potassium: 3.6 mmol/L (ref 3.5–5.1)
Sodium: 133 mmol/L — ABNORMAL LOW (ref 135–145)
Total Bilirubin: 0.7 mg/dL (ref 0.3–1.2)
Total Protein: 5.7 g/dL — ABNORMAL LOW (ref 6.5–8.1)

## 2021-11-04 LAB — HIV ANTIBODY (ROUTINE TESTING W REFLEX): HIV Screen 4th Generation wRfx: NONREACTIVE

## 2021-11-04 MED ORDER — CHLORHEXIDINE GLUCONATE 0.12 % MT SOLN
15.0000 mL | Freq: Two times a day (BID) | OROMUCOSAL | Status: DC
  Administered 2021-11-04 – 2021-12-07 (×63): 15 mL via OROMUCOSAL
  Filled 2021-11-04 (×58): qty 15

## 2021-11-04 MED ORDER — SODIUM CHLORIDE 0.9% FLUSH
10.0000 mL | INTRAVENOUS | Status: DC | PRN
  Administered 2021-11-21 – 2021-11-22 (×2): 10 mL

## 2021-11-04 MED ORDER — ORAL CARE MOUTH RINSE
15.0000 mL | Freq: Two times a day (BID) | OROMUCOSAL | Status: DC
  Administered 2021-11-04 – 2021-12-07 (×47): 15 mL via OROMUCOSAL

## 2021-11-04 MED ORDER — CEFAZOLIN SODIUM-DEXTROSE 2-4 GM/100ML-% IV SOLN
2.0000 g | Freq: Three times a day (TID) | INTRAVENOUS | Status: DC
  Administered 2021-11-04 – 2021-11-12 (×24): 2 g via INTRAVENOUS
  Filled 2021-11-04 (×28): qty 100

## 2021-11-04 MED ORDER — SODIUM CHLORIDE 0.9% FLUSH
10.0000 mL | Freq: Two times a day (BID) | INTRAVENOUS | Status: DC
  Administered 2021-11-04 – 2021-11-06 (×4): 10 mL
  Administered 2021-11-07: 20 mL
  Administered 2021-11-07 – 2021-11-24 (×19): 10 mL
  Administered 2021-11-24: 40 mL
  Administered 2021-11-26 – 2021-12-07 (×11): 10 mL

## 2021-11-04 MED ORDER — FENTANYL CITRATE PF 50 MCG/ML IJ SOSY: 25.0000 ug | PREFILLED_SYRINGE | INTRAMUSCULAR | Status: DC | PRN

## 2021-11-04 MED ORDER — ENOXAPARIN SODIUM 30 MG/0.3ML IJ SOSY
30.0000 mg | PREFILLED_SYRINGE | INTRAMUSCULAR | Status: DC
  Administered 2021-11-04 – 2021-11-05 (×2): 30 mg via SUBCUTANEOUS
  Filled 2021-11-04 (×2): qty 0.3

## 2021-11-04 NOTE — Progress Notes (Signed)
Peripherally Inserted Central Catheter Placement  The IV Nurse has discussed with the patient and/or persons authorized to consent for the patient, the purpose of this procedure and the potential benefits and risks involved with this procedure.  The benefits include less needle sticks, lab draws from the catheter, and the patient may be discharged home with the catheter. Risks include, but not limited to, infection, bleeding, blood clot (thrombus formation), and puncture of an artery; nerve damage and irregular heartbeat and possibility to perform a PICC exchange if needed/ordered by physician.  Alternatives to this procedure were also discussed.  Bard Power PICC patient education guide, fact sheet on infection prevention and patient information card has been provided to patient /or left at bedside.    PICC Placement Documentation  PICC Single Lumen 11/04/21 Right Brachial 41 cm 1 cm (Active)  Indication for Insertion or Continuance of Line Prolonged intravenous therapies 11/04/21 1405  Exposed Catheter (cm) 1 cm 11/04/21 1405  Site Assessment Clean, Dry, Intact 11/04/21 1405  Line Status Flushed;Saline locked;Blood return noted 11/04/21 1405  Dressing Type Transparent;Securing device 11/04/21 1405  Dressing Status Antimicrobial disc in place;Clean, Dry, Intact 11/04/21 1405  Safety Lock Not Applicable 11/04/21 1405  Line Care Connections checked and tightened 11/04/21 1405  Line Adjustment (NICU/IV Team Only) No 11/04/21 1405  Dressing Intervention New dressing 11/04/21 1405  Dressing Change Due 11/11/21 11/04/21 1405       Elliot Dally 11/04/2021, 2:06 PM

## 2021-11-04 NOTE — Plan of Care (Signed)
  Problem: Coping: Goal: Ability to adjust to condition or change in health will improve Outcome: Progressing   

## 2021-11-04 NOTE — Progress Notes (Signed)
Don RN notified PICC in place and to remove CVC and 2 PIV's in left arm saline locked to minimize risk of CABSI.

## 2021-11-04 NOTE — Progress Notes (Signed)
NAME:  Glenn [name], MRN:  [id number], DOB:  Apr 24, 1941, LOS: 3 ADMISSION DATE:  10/21/2021, CONSULTATION DATE:  10/17/2021 REFERRING MD:  [facility] EDP, CHIEF COMPLAINT:  generally weak   History of Present Illness:  81 yo male with pmh of iddm with hyperglycemia, chf with ppm in place, cad, prostatomegaly presented to [facility] after not feeling well, generally weak x 3 days, and 2 days of vomiting/ poor PO intake after mechanical fall 3 days ago hitting his back and buttocks, since with progressive difficulty with ambulation.  Denies fever, chills, nausea, dirrhea. No CP, dizziness, ha, sob. No other associated symptoms.   Found to be afebrile and hypotensive at OSH, initially fluid responsive but ended up peripheral pressors.  Workup notable for UTI, AKI, transaminitis with elevated INR, hyponatremic, and imaging findings concerning for lumbar osteomyelitis vs discitis and left hydronephrosis with bladder outlet obstruction.  Foley placed with 1.7L UOP.  Transferred to [facility] given needs for MRI and determine PPM compatibility.   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  - mostly followed by [facility]> records limited   Significant Hospital Events: Including procedures, antibiotic start and stop dates in addition to other pertinent events   5/31>  transferred to [facility] from [facility] for MRI given PPM.  Urosepsis/ shock with r/o lumbar osteomyelitis/ discitis  6/1 >  CVL placed for pressor needs; abx narrowed to ctx for kleb bacteremia, ID on board     5/31 BC x2> 2/2 klebsiella pna 5/31 UC> multi species 5/31 MRSA PCR> neg  5/31 vanc/ cefepime/ flagyl/ azithro 5/31 ceftriaxone>    5/31 CT a/p wo>  1. Gas within the disc space at L2-3 and dissemination of gas into the LEFT psoas and RIGHT retroperitoneum as well as potentially in the epidural space posterior to L2. Findings raise the question of discitis/osteomyelitis with gas-forming organism.  Sampling of this area or MRI as possible may be helpful for further evaluation. 2. Signs of marked urinary bladder distension compatible with bladder outlet obstruction with less collecting system dilation than on previous imaging. Correlate with signs of urinary tract infection and or urosepsis. 3. Generalized mesenteric and body wall edema. 4. Cholelithiasis without evidence of acute cholecystitis. 5. Marked cardiomegaly with pacer defibrillator. Aortic Atherosclerosis  6/1 TEE > endomyocardial trabeculations noted but does not meet criteria for left ventricular non-compaction, LVEF < 20%, no WMA, G2DD, RV normal, est RA 3  Interim History / Subjective:  Pressors off  MRI without drainable focus of infection  Objective   Blood pressure 112/69, pulse 87, temperature 97.7 F (36.5 C), temperature source Oral, resp. rate 16, weight 64.2 kg, SpO2 98 %. CVP:  [2 mmHg-34 mmHg] 2 mmHg      Intake/Output Summary (Last 24 hours) at 11/04/2021 0842 Last data filed at 11/04/2021 [id number] Gross per 24 hour  Intake 710.88 ml  Output 1190 ml  Net -479.12 ml   Filed Weights   10/14/2021 2333 11/03/21 0600 11/04/21 0630  Weight: 59.8 kg 63.2 kg 64.2 kg    Examination: General appearance: 81 y.o., male, NAD, conversant  Eyes: PERRL, tracking appropriately Lungs: CTAB, no crackles, no wheeze, with normal respiratory effort CV: RRR, no murmur  Abdomen: Soft, non-tender; non-distended, BS present  Extremities: trace peripheral edema, warm, kerlix  Neuro: grossly nonfocal  Labs/imaging reviewed  Resolved Hospital Problem list     Assessment & Plan:  Septic shock 2/2 UTI, Klebsiella bacteremia, L2-L3 vertebral OM/discitis, Psoas myositis, shock  resolving P:  - place picc line, remove central once placed - ID following, cont ceftriaxone   Presumed NSVT, frequent ectopy P: - PPM evaluated by EP 6/1> PPM is [employer] CRT-D which IS MRI compatible - telemetry reviewed> events presumed NSVT are  his RV paced beats but he is primarily LV paced.  Amiodarone stopped 6/1 (briefly on).   - continue telemetry monitoring  AKI 2/2 septic shock and urinary retention/ bladder outlet obstruction  Left hydronephrosis likely secondary to urinary retention Hyponatremia- presumed hypovolemic hyponatremia NAGMA/ lactic acidosis  - daughter reports hx urinary retention and prior TURP x 3 in past but no issues over the last 1-2 year.  P:  - UOP stable and sCr continues to improve - home proscar - renal US> no hydronephrosis.  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  Unclear baseline HF status.   - hold home meds coreg, entresto, torsemide given shock  - TTE as above  - per daughter, patient just had a DCCV for afib on 5/22.  Continue to hold home Eliquis given coagulapathy and pending MRI.  Remains mostly vpaced.    Microcytic Anemia Hx IDA on iron infusions P: - trend CBC.  No evidence of bleeding.  Transfuse < 7  Best Practice (right click and "Reselect all SmartList Selections" daily)   Diet/type: Regular consistency (see orders)  DVT prophylaxis: SCD, reported heparin allergy ("head to feel hot"); start lovenox dvt ppx  GI prophylaxis: PPI Lines: Central line Foley:  N/A and Yes, and it is no longer needed Code Status:  full code Last date of multidisciplinary goals of care discussion 6/1 Daughter, Barin Shirah [phone] updated by phone 6/1.  Pending update 6/3.     Critical care time:  34 mins    [place]  11/04/2021, 8:42 AM  See Shea Evans for pager If no response to pager, please call PCCM consult pager After 7:00 pm call Elink

## 2021-11-04 NOTE — Progress Notes (Signed)
CBG 59. Pt AoX4. Pt taking POs.268ml of orange juice given. Clear liquid breakfast tray also arrived and Pt eating/drinking. MD made aware. No S&S of hypoglycemia other than CBG. Low CBG correlates with glucose on CBC.

## 2021-11-05 DIAGNOSIS — N320 Bladder-neck obstruction: Secondary | ICD-10-CM

## 2021-11-05 DIAGNOSIS — R7401 Elevation of levels of liver transaminase levels: Secondary | ICD-10-CM

## 2021-11-05 DIAGNOSIS — K6812 Psoas muscle abscess: Secondary | ICD-10-CM

## 2021-11-05 DIAGNOSIS — N189 Chronic kidney disease, unspecified: Secondary | ICD-10-CM

## 2021-11-05 DIAGNOSIS — M4626 Osteomyelitis of vertebra, lumbar region: Secondary | ICD-10-CM

## 2021-11-05 DIAGNOSIS — A419 Sepsis, unspecified organism: Secondary | ICD-10-CM | POA: Diagnosis not present

## 2021-11-05 DIAGNOSIS — E876 Hypokalemia: Secondary | ICD-10-CM

## 2021-11-05 DIAGNOSIS — D689 Coagulation defect, unspecified: Secondary | ICD-10-CM

## 2021-11-05 DIAGNOSIS — E440 Moderate protein-calorie malnutrition: Secondary | ICD-10-CM

## 2021-11-05 DIAGNOSIS — I2510 Atherosclerotic heart disease of native coronary artery without angina pectoris: Secondary | ICD-10-CM

## 2021-11-05 DIAGNOSIS — N179 Acute kidney failure, unspecified: Secondary | ICD-10-CM | POA: Diagnosis not present

## 2021-11-05 DIAGNOSIS — E871 Hypo-osmolality and hyponatremia: Secondary | ICD-10-CM

## 2021-11-05 DIAGNOSIS — M4640 Discitis, unspecified, site unspecified: Secondary | ICD-10-CM

## 2021-11-05 DIAGNOSIS — I5042 Chronic combined systolic (congestive) and diastolic (congestive) heart failure: Secondary | ICD-10-CM

## 2021-11-05 DIAGNOSIS — I5A Non-ischemic myocardial injury (non-traumatic): Secondary | ICD-10-CM

## 2021-11-05 DIAGNOSIS — N1832 Chronic kidney disease, stage 3b: Secondary | ICD-10-CM

## 2021-11-05 DIAGNOSIS — D631 Anemia in chronic kidney disease: Secondary | ICD-10-CM

## 2021-11-05 DIAGNOSIS — I480 Paroxysmal atrial fibrillation: Secondary | ICD-10-CM

## 2021-11-05 DIAGNOSIS — D696 Thrombocytopenia, unspecified: Secondary | ICD-10-CM

## 2021-11-05 LAB — PROTIME-INR
INR: 1.4 — ABNORMAL HIGH (ref 0.8–1.2)
Prothrombin Time: 17.2 seconds — ABNORMAL HIGH (ref 11.4–15.2)

## 2021-11-05 LAB — COMPREHENSIVE METABOLIC PANEL
ALT: 82 U/L — ABNORMAL HIGH (ref 0–44)
AST: 70 U/L — ABNORMAL HIGH (ref 15–41)
Albumin: 1.9 g/dL — ABNORMAL LOW (ref 3.5–5.0)
Alkaline Phosphatase: 336 U/L — ABNORMAL HIGH (ref 38–126)
Anion gap: 10 (ref 5–15)
BUN: 75 mg/dL — ABNORMAL HIGH (ref 8–23)
CO2: 19 mmol/L — ABNORMAL LOW (ref 22–32)
Calcium: 8.5 mg/dL — ABNORMAL LOW (ref 8.9–10.3)
Chloride: 101 mmol/L (ref 98–111)
Creatinine, Ser: 1.9 mg/dL — ABNORMAL HIGH (ref 0.61–1.24)
GFR, Estimated: 36 mL/min — ABNORMAL LOW (ref >60)
Glucose, Bld: 270 mg/dL — ABNORMAL HIGH (ref 70–99)
Potassium: 4.4 mmol/L (ref 3.5–5.1)
Sodium: 130 mmol/L — ABNORMAL LOW (ref 135–145)
Total Bilirubin: 0.6 mg/dL (ref 0.3–1.2)
Total Protein: 5.8 g/dL — ABNORMAL LOW (ref 6.5–8.1)

## 2021-11-05 LAB — CBC
HCT: 34 % — ABNORMAL LOW (ref 39.0–52.0)
Hemoglobin: 11.8 g/dL — ABNORMAL LOW (ref 13.0–17.0)
MCH: 27.9 pg (ref 26.0–34.0)
MCHC: 34.7 g/dL (ref 30.0–36.0)
MCV: 80.4 fL (ref 80.0–100.0)
Platelets: 83 10*3/uL — ABNORMAL LOW (ref 150–400)
RBC: 4.23 MIL/uL (ref 4.22–5.81)
RDW: 16.3 % — ABNORMAL HIGH (ref 11.5–15.5)
WBC: 13.9 10*3/uL — ABNORMAL HIGH (ref 4.0–10.5)
nRBC: 0 % (ref 0.0–0.2)

## 2021-11-05 LAB — GLUCOSE, CAPILLARY
Glucose-Capillary: 284 mg/dL — ABNORMAL HIGH (ref 70–99)
Glucose-Capillary: 307 mg/dL — ABNORMAL HIGH (ref 70–99)
Glucose-Capillary: 164 mg/dL — ABNORMAL HIGH (ref 70–99)
Glucose-Capillary: 213 mg/dL — ABNORMAL HIGH (ref 70–99)

## 2021-11-05 MED ORDER — INSULIN ASPART 100 UNIT/ML IJ SOLN
0.0000 [IU] | Freq: Three times a day (TID) | INTRAMUSCULAR | Status: DC
  Administered 2021-11-05: 8 [IU] via SUBCUTANEOUS
  Administered 2021-11-05: 11 [IU] via SUBCUTANEOUS
  Administered 2021-11-05: 3 [IU] via SUBCUTANEOUS
  Administered 2021-11-06: 5 [IU] via SUBCUTANEOUS
  Administered 2021-11-06: 3 [IU] via SUBCUTANEOUS
  Administered 2021-11-07: 5 [IU] via SUBCUTANEOUS
  Administered 2021-11-07: 3 [IU] via SUBCUTANEOUS
  Administered 2021-11-07 – 2021-11-08 (×2): 8 [IU] via SUBCUTANEOUS
  Administered 2021-11-08: 15 [IU] via SUBCUTANEOUS
  Administered 2021-11-08 – 2021-11-09 (×2): 5 [IU] via SUBCUTANEOUS
  Administered 2021-11-09: 8 [IU] via SUBCUTANEOUS
  Administered 2021-11-10 (×2): 5 [IU] via SUBCUTANEOUS
  Administered 2021-11-10: 2 [IU] via SUBCUTANEOUS
  Administered 2021-11-11: 5 [IU] via SUBCUTANEOUS
  Administered 2021-11-11 (×2): 8 [IU] via SUBCUTANEOUS
  Administered 2021-11-12 (×2): 11 [IU] via SUBCUTANEOUS
  Administered 2021-11-12: 3 [IU] via SUBCUTANEOUS
  Administered 2021-11-13: 2 [IU] via SUBCUTANEOUS
  Administered 2021-11-13 – 2021-11-14 (×3): 3 [IU] via SUBCUTANEOUS
  Administered 2021-11-14: 5 [IU] via SUBCUTANEOUS
  Administered 2021-11-14: 3 [IU] via SUBCUTANEOUS
  Administered 2021-11-15: 5 [IU] via SUBCUTANEOUS
  Administered 2021-11-15 – 2021-11-16 (×3): 3 [IU] via SUBCUTANEOUS
  Administered 2021-11-16: 2 [IU] via SUBCUTANEOUS
  Administered 2021-11-18: 5 [IU] via SUBCUTANEOUS
  Administered 2021-11-18: 2 [IU] via SUBCUTANEOUS
  Administered 2021-11-18: 8 [IU] via SUBCUTANEOUS
  Administered 2021-11-19: 5 [IU] via SUBCUTANEOUS
  Administered 2021-11-19 (×2): 3 [IU] via SUBCUTANEOUS
  Administered 2021-11-20: 5 [IU] via SUBCUTANEOUS
  Administered 2021-11-20: 3 [IU] via SUBCUTANEOUS
  Administered 2021-11-20: 5 [IU] via SUBCUTANEOUS
  Administered 2021-11-21 (×2): 3 [IU] via SUBCUTANEOUS
  Administered 2021-11-21: 5 [IU] via SUBCUTANEOUS
  Administered 2021-11-22: 2 [IU] via SUBCUTANEOUS
  Administered 2021-11-22 – 2021-11-23 (×4): 3 [IU] via SUBCUTANEOUS
  Administered 2021-11-23: 5 [IU] via SUBCUTANEOUS
  Administered 2021-11-24: 2 [IU] via SUBCUTANEOUS
  Administered 2021-11-24: 3 [IU] via SUBCUTANEOUS
  Administered 2021-11-24: 5 [IU] via SUBCUTANEOUS
  Administered 2021-11-25: 3 [IU] via SUBCUTANEOUS
  Administered 2021-11-25 (×2): 5 [IU] via SUBCUTANEOUS
  Administered 2021-11-26 (×2): 2 [IU] via SUBCUTANEOUS
  Administered 2021-11-27 – 2021-11-28 (×2): 3 [IU] via SUBCUTANEOUS
  Administered 2021-11-29: 2 [IU] via SUBCUTANEOUS
  Administered 2021-11-29: 5 [IU] via SUBCUTANEOUS
  Administered 2021-11-29: 2 [IU] via SUBCUTANEOUS
  Administered 2021-11-30 (×2): 3 [IU] via SUBCUTANEOUS
  Administered 2021-11-30: 5 [IU] via SUBCUTANEOUS
  Administered 2021-12-01: 2 [IU] via SUBCUTANEOUS
  Administered 2021-12-01: 3 [IU] via SUBCUTANEOUS
  Administered 2021-12-01 – 2021-12-02 (×2): 5 [IU] via SUBCUTANEOUS
  Administered 2021-12-02: 3 [IU] via SUBCUTANEOUS
  Administered 2021-12-03: 5 [IU] via SUBCUTANEOUS

## 2021-11-05 MED ORDER — INSULIN ASPART 100 UNIT/ML IJ SOLN
0.0000 [IU] | Freq: Every day | INTRAMUSCULAR | Status: DC
  Administered 2021-11-05 – 2021-11-07 (×2): 2 [IU] via SUBCUTANEOUS
  Administered 2021-11-08: 3 [IU] via SUBCUTANEOUS
  Administered 2021-11-10 – 2021-11-23 (×4): 2 [IU] via SUBCUTANEOUS

## 2021-11-05 MED ORDER — INSULIN ASPART 100 UNIT/ML IJ SOLN: 0.0000 [IU] | Freq: Three times a day (TID) | INTRAMUSCULAR | Status: DC

## 2021-11-05 MED ORDER — TAMSULOSIN HCL 0.4 MG PO CAPS
0.8000 mg | ORAL_CAPSULE | Freq: Every day | ORAL | Status: DC
  Administered 2021-11-05 – 2021-12-06 (×31): 0.8 mg via ORAL
  Filled 2021-11-05 (×33): qty 2

## 2021-11-05 MED ORDER — INSULIN DETEMIR 100 UNIT/ML ~~LOC~~ SOLN
5.0000 [IU] | Freq: Every day | SUBCUTANEOUS | Status: DC
  Administered 2021-11-05 – 2021-11-08 (×4): 5 [IU] via SUBCUTANEOUS
  Filled 2021-11-05 (×4): qty 0.05

## 2021-11-05 MED ORDER — APIXABAN 5 MG PO TABS
5.0000 mg | ORAL_TABLET | Freq: Two times a day (BID) | ORAL | Status: DC
Start: 2021-11-05 — End: 2021-11-06
  Administered 2021-11-05 (×2): 5 mg via ORAL
  Filled 2021-11-05 (×2): qty 1

## 2021-11-05 MED ORDER — ATORVASTATIN CALCIUM 80 MG PO TABS
80.0000 mg | ORAL_TABLET | Freq: Every day | ORAL | Status: DC
Start: 2021-11-05 — End: 2021-12-05
  Administered 2021-11-05 – 2021-12-04 (×30): 80 mg via ORAL
  Filled 2021-11-05 (×30): qty 1

## 2021-11-05 NOTE — Assessment & Plan Note (Addendum)
Septic shock present on admission due to Klebsiella bacteremia, Klebsiella UTI, as well as left psoas abscess and L4-5 discitis/osteomyelitis. - Continue cefazolin - Consult infectious disease

## 2021-11-05 NOTE — Assessment & Plan Note (Signed)
Longstanding problem, reports to me he has had "multiple prostate procedures in the last few years" - In-N-Out cath as needed - Continue finasteride

## 2021-11-05 NOTE — Assessment & Plan Note (Signed)
Hemoglobin appears to be stable around the baseline, no clinical bleeding

## 2021-11-05 NOTE — Assessment & Plan Note (Signed)
Rate controlled at present.  His coagulopathy is resolved, and no procedures are planned. - Resume Coreg - Hold Eliquis - Trend INR to normal

## 2021-11-05 NOTE — Assessment & Plan Note (Signed)
-   Continue atorvastatin, BB - Hold Jardiance for now

## 2021-11-05 NOTE — Progress Notes (Signed)
Progress Note   Patient: Glenn [name] [id number] DOB: Feb 02, 1941 DOA: 10/27/2021     4 DOS: the patient was seen and examined on 11/05/2021 at 11:45 AM      Brief hospital course: Glenn [name] is a 81 y.o. M with CAD s/p CABG 82, isch CM and sCHF EF <20%, DM, CKD IIIb baseline 1.7, on Eliquis for unclear indication, hx PPM and BPH who presented with 3 days weakness to [facility].  Evidently malaise and generalized weakness started few days prior to admission, had anorexia and poor PO intake, got weaker and weaker, fell and developed low back pain which became worse and worse, called EMS.  5/31: In the ER, BP 70/40, WBC 24K, RR 25, Na 129, INR 3, Cr 3.62 (baseline 2018 1.1, none since), CT showed bladder distension and ?discitis/osteo of the spine 6/1: Arrived to [facility] on pressors, blood cultures with K pneumoniae in 2/2, UCX multiple (and had urinary retention with 1.7L out), TTE no vegetations 6/2: MRI without drainable focus, pressors titrated off, transferred OOU      Assessment and Plan: * Septic shock ([place]) Septic shock present on admission due to Klebsiella bacteremia, Klebsiella UTI, as well as left psoas abscess and L4-5 discitis/osteomyelitis. - Continue cefazolin - Consult infectious disease  Paroxysmal atrial fibrillation ([place]) Rate controlled at present.  His coagulopathy is resolved, and no procedures are planned. - Hold carvedilol for now - Resume Eliquis  Protein-calorie malnutrition, moderate ([place]) As evidenced by diffuse muscle loss and moderate loss of subcutaneous muscle mass and fat, wasting of the thighs, thenar wasting, temporal wasting -Consult dietitian  Coagulopathy [facility]) Patient presented with INR 3, INR now back to 1.4 Likely septic coagulopathy  Anemia due to chronic kidney disease Hemoglobin appears to be stable around the baseline, no clinical bleeding  Thrombocytopenia (HCC) Platelets are low.  83 K today.  Myocardial injury Minimal troponin  elevation, low and flat, consistent with sepsis related myocardial injury without angina or MI.  Transaminitis Due to septic injury, improving.  Hypokalemia Supplemented and resolved  Hyponatremia - Hold diuretics for now  Chronic combined systolic and diastolic CHF (congestive heart failure) (HCC) EF less than 20%, tricuspid regurgitation noted, grade 2 diastolic dysfunction.  Clinically appears euvolemic at this time.  Blood pressure up to the 110s. -Continue to hold Entresto, torsemide, carvedilol for now, will cautiously add back soon  Coronary artery disease involving native coronary artery of native heart without angina pectoris - Continue atorvastatin - Hold Jardiance and BB for now  Chronic kidney disease, stage 3b ([place]) See above  Uncontrolled type 2 diabetes mellitus with hyperglycemia, with long-term current use of insulin (HCC) A1c >10% Glucose here was low with Levemir BID, now stopped and is up to the 300s  - Hold Jardiance, 70/30, victoza and metformin - Resume levmeir, lower dose - Continue SSI   Bladder outlet obstruction Longstanding problem, reports to me he has had "multiple prostate procedures in the last few years" - In-N-Out cath as needed - Continue finasteride  Acute kidney injury ([place]) Baseline creatinine per outside records includes around 1.7.  Creatinine on admission was 3.6, is now improved back down to 1.9. -Avoid nephrotoxins, hypotension, NSAIDs          Subjective: Patient has no complaints.  Appetite is good.  He feels a little confused still.  No chest pain, dyspnea, swelling, fever.  His back pain is severe but improved with oxycodone.     Physical Exam: Vitals:   11/04/21 2320 11/05/21  0406 11/05/21 0800 11/05/21 1100  BP: 105/64 120/69 119/64 98/61  Pulse: 96 94 (!) 103 92  Resp: 20 20 (!) 26 (!) 22  Temp: 98.7 F (37.1 C) 98.5 F (36.9 C) 97.9 F (36.6 C) 97.9 F (36.6 C)  TempSrc: Oral Oral Oral Oral  SpO2: 98%  99% 99% 93%  Weight:       Thin adult male, lying in bed, interactive, appropriate Tachycardic slightly, I do not appreciate murmurs, no peripheral edema, no JVD Respiratory rate normal, lungs clear without rales or wheezes Abdomen soft no tenderness palpation or guarding in all quadrants Attention normal, affect appropriate, judgment and insight appear slightly impaired, moves upper extremities with generalized weakness but symmetric strength, speech fluent, face symmetric  Data Reviewed: Infectious disease notes reviewed, nursing notes reviewed, vital signs reviewed MRI report shows L2-3 irregularity consistent with osteo-/discitis, left psoas edema and gas consistent with abscess INR today 1.4 Renal ultrasound shows medical renal disease, no obstruction Patient metabolic panel today shows mild hyponatremia, normal potassium, creatinine down to 1.9, transaminitis which is resolving Echocardiogram from earlier this hospitalization shows EF less than 123456, grade 2 diastolic dysfunction           Disposition: Status is: Inpatient         Author: Edwin Dada, MD 11/05/2021 11:50 AM  For on call review [email or web address].

## 2021-11-05 NOTE — Assessment & Plan Note (Addendum)
Patient presented with INR 3, INR now back to 1.4 Likely septic coagulopathy, only on Eliquis at home - Trend INR

## 2021-11-05 NOTE — Progress Notes (Signed)
Upon's preforming ordered bladder scan RN noted a reading of 581 in pts bladder. RN requested order for in and out cath from provider who gave a verbal parameter of 500 with order.  While inserting catheter RN met firm resistance unable to drain bladder.  MD notified  RN suggested cude catheter MD verbalized to RN that he would like to discontinue bladder scans and go by pt symptoms. MD also stated he is not concerned with bladder scan reading and that pt could go through the night with out bladder drainage.  Will continue to monitor pt symptoms.

## 2021-11-05 NOTE — Assessment & Plan Note (Signed)
Stable - Resume diuretics and monitor

## 2021-11-05 NOTE — Assessment & Plan Note (Addendum)
A1c >10% Glucose here was low with Levemir BID, now stopped and was up to the 300s  Last 24 hours glucoses suitable, 180s-220s. - Hold Jardiance, 70/30, victoza and metformin - Continue Levemir - Continue SSI

## 2021-11-05 NOTE — Assessment & Plan Note (Addendum)
As evidenced by diffuse muscle loss and moderate loss of subcutaneous muscle mass and fat, wasting of the thighs, thenar wasting, temporal wasting -Consult dietitian

## 2021-11-05 NOTE — Assessment & Plan Note (Signed)
See above

## 2021-11-05 NOTE — Assessment & Plan Note (Signed)
-   Supplemented and resolved 

## 2021-11-05 NOTE — Assessment & Plan Note (Signed)
EF less than 20%, tricuspid regurgitation noted, grade 2 diastolic dysfunction.  Clinically appears euvolemic at this time.  Blood pressure up to the 110s. -Continue to hold Entresto, torsemide, carvedilol for now, will cautiously add back soon

## 2021-11-05 NOTE — Assessment & Plan Note (Signed)
Minimal troponin elevation, low and flat, consistent with sepsis related myocardial injury without angina or MI.

## 2021-11-05 NOTE — Assessment & Plan Note (Signed)
Due to septic injury, improving.

## 2021-11-05 NOTE — Plan of Care (Signed)
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

## 2021-11-05 NOTE — Assessment & Plan Note (Signed)
Baseline creatinine per outside records includes around 1.7.  Creatinine on admission was 3.6, is now improved back down to 1.9. -Avoid nephrotoxins, hypotension, NSAIDs

## 2021-11-05 NOTE — Hospital Course (Addendum)
Glenn [name] is a 81 y.o. M with CAD s/p CABG 24, isch CM and sCHF EF <20%, DM, CKD IIIb baseline 1.7, pAF on Eliquis, hx PPM and BPH who presented with 3 days weakness to [facility].  Evidently malaise and generalized weakness started few days prior to admission, had anorexia and poor PO intake, got weaker and weaker, fell and developed low back pain which became worse and worse, called EMS.  5/31: In the ER, BP 70/40, WBC 24K, RR 25, Na 129, INR 3, Cr 3.62 (baseline 2018 1.1, none since), CT showed bladder distension and ?discitis/osteo of the spine 6/1: Arrived to [facility] on pressors, blood cultures with K pneumoniae in 2/2, UCX multiple (and had urinary retention with 1.7L out), TTE no vegetations 6/2: MRI without drainable focus, pressors titrated off, transferred OOU 6/5: IR consulted for disc aspiration

## 2021-11-05 NOTE — Assessment & Plan Note (Addendum)
Platelets down to 80K due to sepsis.  Now resolved to normal

## 2021-11-06 ENCOUNTER — Encounter (HOSPITAL_COMMUNITY): Payer: Self-pay | Admitting: Critical Care Medicine

## 2021-11-06 DIAGNOSIS — A419 Sepsis, unspecified organism: Secondary | ICD-10-CM | POA: Diagnosis not present

## 2021-11-06 DIAGNOSIS — N320 Bladder-neck obstruction: Secondary | ICD-10-CM | POA: Diagnosis not present

## 2021-11-06 DIAGNOSIS — L97509 Non-pressure chronic ulcer of other part of unspecified foot with unspecified severity: Secondary | ICD-10-CM

## 2021-11-06 DIAGNOSIS — N179 Acute kidney failure, unspecified: Secondary | ICD-10-CM | POA: Diagnosis not present

## 2021-11-06 DIAGNOSIS — N189 Chronic kidney disease, unspecified: Secondary | ICD-10-CM | POA: Diagnosis not present

## 2021-11-06 LAB — CBC
HCT: 33.5 % — ABNORMAL LOW (ref 39.0–52.0)
Hemoglobin: 11.7 g/dL — ABNORMAL LOW (ref 13.0–17.0)
MCH: 28 pg (ref 26.0–34.0)
MCHC: 34.9 g/dL (ref 30.0–36.0)
MCV: 80.1 fL (ref 80.0–100.0)
Platelets: 110 10*3/uL — ABNORMAL LOW (ref 150–400)
RBC: 4.18 MIL/uL — ABNORMAL LOW (ref 4.22–5.81)
RDW: 16.5 % — ABNORMAL HIGH (ref 11.5–15.5)
WBC: 17 10*3/uL — ABNORMAL HIGH (ref 4.0–10.5)
nRBC: 0 % (ref 0.0–0.2)

## 2021-11-06 LAB — BASIC METABOLIC PANEL
Anion gap: 6 (ref 5–15)
BUN: 75 mg/dL — ABNORMAL HIGH (ref 8–23)
CO2: 21 mmol/L — ABNORMAL LOW (ref 22–32)
Calcium: 8.7 mg/dL — ABNORMAL LOW (ref 8.9–10.3)
Chloride: 104 mmol/L (ref 98–111)
Creatinine, Ser: 1.99 mg/dL — ABNORMAL HIGH (ref 0.61–1.24)
GFR, Estimated: 34 mL/min — ABNORMAL LOW (ref >60)
Glucose, Bld: 213 mg/dL — ABNORMAL HIGH (ref 70–99)
Potassium: 4.5 mmol/L (ref 3.5–5.1)
Sodium: 131 mmol/L — ABNORMAL LOW (ref 135–145)

## 2021-11-06 LAB — PROTIME-INR
INR: 1.6 — ABNORMAL HIGH (ref 0.8–1.2)
Prothrombin Time: 18.6 seconds — ABNORMAL HIGH (ref 11.4–15.2)

## 2021-11-06 LAB — GLUCOSE, CAPILLARY
Glucose-Capillary: 157 mg/dL — ABNORMAL HIGH (ref 70–99)
Glucose-Capillary: 216 mg/dL — ABNORMAL HIGH (ref 70–99)
Glucose-Capillary: 230 mg/dL — ABNORMAL HIGH (ref 70–99)
Glucose-Capillary: 180 mg/dL — ABNORMAL HIGH (ref 70–99)
Glucose-Capillary: 188 mg/dL — ABNORMAL HIGH (ref 70–99)

## 2021-11-06 MED ORDER — ADULT MULTIVITAMIN W/MINERALS CH
1.0000 | ORAL_TABLET | Freq: Every day | ORAL | Status: DC
  Administered 2021-11-06 – 2021-12-03 (×28): 1 via ORAL
  Filled 2021-11-06 (×28): qty 1

## 2021-11-06 MED ORDER — CARVEDILOL 6.25 MG PO TABS
6.2500 mg | ORAL_TABLET | Freq: Two times a day (BID) | ORAL | Status: DC
Start: 2021-11-06 — End: 2021-11-20
  Administered 2021-11-06 – 2021-11-20 (×29): 6.25 mg via ORAL
  Filled 2021-11-06 (×28): qty 1

## 2021-11-06 NOTE — Progress Notes (Signed)
Progress Note   Patient: Glenn [name] DOB: 1940/11/16 DOA: 10/23/2021     5 DOS: the patient was seen and examined on 11/06/2021 at 9:05AM      Brief hospital course: Glenn [name] is a 81 y.o. M with CAD s/p CABG 36, isch CM and sCHF EF <20%, DM, CKD IIIb baseline 1.7, on Eliquis for unclear indication, hx PPM and BPH who presented with 3 days weakness to [facility].  Evidently malaise and generalized weakness started few days prior to admission, had anorexia and poor PO intake, got weaker and weaker, fell and developed low back pain which became worse and worse, called EMS.  5/31: In the ER, BP 70/40, WBC 24K, RR 25, Na 129, INR 3, Cr 3.62 (baseline 2018 1.1, none since), CT showed bladder distension and ?discitis/osteo of the spine 6/1: Arrived to [facility] on pressors, blood cultures with K pneumoniae in 2/2, UCX multiple (and had urinary retention with 1.7L out), TTE no vegetations 6/2: MRI without drainable focus, pressors titrated off, transferred OOU      Assessment and Plan: * Septic shock ([place]) Septic shock present on admission due to Klebsiella bacteremia, Klebsiella UTI, as well as left psoas abscess and L4-5 discitis/osteomyelitis. - Continue cefazolin - Consult infectious disease  Paroxysmal atrial fibrillation ([place]) Rate controlled at present.  Glenn [name] coagulopathy is resolved, and no procedures are planned. - Resume Coreg - Hold Eliquis - Trend INR to normal  Protein-calorie malnutrition, moderate (HCC) As evidenced by diffuse muscle loss and moderate loss of subcutaneous muscle mass and fat, wasting of the thighs, thenar wasting, temporal wasting -Consult dietitian  Coagulopathy [facility]) Patient presented with INR 3, INR now back to 1.4 Likely septic coagulopathy INR up to 1.7 today  Anemia due to chronic kidney disease Hemoglobin appears to be stable around the baseline, no clinical bleeding  Thrombocytopenia (HCC) Platelets are low.  Trended up >100K  today  Myocardial injury Minimal troponin elevation, low and flat, consistent with sepsis related myocardial injury without angina or MI.  Transaminitis Due to septic injury, improving.  Hypokalemia Supplemented and resolved  Hyponatremia - Hold diuretics for now  Chronic combined systolic and diastolic CHF (congestive heart failure) (HCC) EF less than 20%, tricuspid regurgitation noted, grade 2 diastolic dysfunction.  Clinically appears euvolemic at this time.  Blood pressure up to the 110s. -Continue to hold Entresto, torsemide give soft BP - Resume carvedilol lower dose  Coronary artery disease involving native coronary artery of native heart without angina pectoris - Continue atorvastatin, BB - Hold Jardiance for now  Chronic kidney disease, stage 3b ([place]) See above  Uncontrolled type 2 diabetes mellitus with hyperglycemia, with long-term current use of insulin (HCC) A1c >10% Glucose here was low with Levemir BID, now stopped and was up to the 300s  [place] today - Hold Jardiance, 70/30, victoza and metformin - Continue Levemir - Continue SSI   Bladder outlet obstruction Longstanding problem, due to BPH.  Reports to me he has had "multiple prostate procedures in the last few years"  At [place] he is on finasteride and flomax and self-caths every other day.  He is supposed to self-cath every day, but prefers not to.    Unfortunately, not restarted on flomax until out of the ICU, and also, nursing unable to perform caths. - Self-cath daily - Stand to urinate - Continue finasteride and Flomax - Outpatient Urology follow up needed asap  Acute kidney injury ([place]) Baseline creatinine per outside records includes around 1.7.  Creatinine on  admission was 3.6, is now improved back down to 1.9 and stable -Avoid nephrotoxins, hypotension, NSAIDs          Subjective: I/O cath for 1089ml Overnight.  A little hemopytsis overnight.  Feeling better though.  Back pain well  controlled.     Physical Exam: Vitals:   11/06/21 0000 11/06/21 0310 11/06/21 0700 11/06/21 0752  BP: 115/65 116/67 112/66   Pulse: 95 100 97 99  Resp: 20 20 20    Temp: 97.7 F (36.5 C) 98.1 F (36.7 C) 98.6 F (37 C)   TempSrc: Oral Oral Oral   SpO2: 95% 98% 98% 98%  Weight:       Thin adult male, sitting in bed, eating breakfast, no acute distress, interactive Tachycardic, irregular, not appreciate murmurs, he has no pitting lower extremity edema, no JVD Respiratory effort seems normal, lungs clear without rales or wheezes Abdomen soft no tenderness palpation or guarding Attention normal, affect appropriate, judgment and insight appear normal for age, moves upper extremities with generalized weakness but symmetric strength, speech fluent, face symmetric     Data Reviewed: Nursing notes reviewed, vital signs reviewed Basic metabolic panel shows stable creatinine, potassium Hemogram shows stable hemoglobin, improved platelets, elevated white count      Disposition: Status is: Inpatient         Author: Edwin Dada, MD 11/06/2021 9:34 AM  For on call review [email or web address].

## 2021-11-06 NOTE — Progress Notes (Signed)
Initial Nutrition Assessment  DOCUMENTATION CODES:   Non-severe (moderate) malnutrition in context of chronic illness  INTERVENTION:   - Given malnutrition, liberalize diet to Regular to provide more food options  - Double protein portions TID with meals  - MVI with minerals daily  - Encourage PO intake  NUTRITION DIAGNOSIS:   Moderate Malnutrition related to chronic illness (CHF) as evidenced by moderate fat depletion, severe muscle depletion.  GOAL:   Patient will meet greater than or equal to 90% of their needs  MONITOR:   PO intake, Supplement acceptance, Labs, Weight trends, Skin, I & O's  REASON FOR ASSESSMENT:   Consult Assessment of nutrition requirement/status  ASSESSMENT:   81 year old male who presented to the ED on 5/31 with generalized weakness. PMH of CAD s/p CABG, recent PPM placement, atrial fibrillation, DM, CHF, CAD. Pt admitted with septic shock 2/2 UTI with hydronephrosis, AKI, bacteremia, L psoas abscess, and vertebral osteomyelitis/discitis.  Spoke with pt at bedside. RN in room providing nursing care.  Pt reports that he has been "eating too much" during this admission. He states that he is not used to eating 3 meals daily and that his blood sugars are running hight. Explained to pt that it is expected for his blood sugars to run higher than usual during an acute illness and hospital admission. Reassured pt that he was not eating too much and encouraged pt to continue to eat what sounds good with a focus on protein-rich foods.  Pt reports that his daughter lives with him and cares for him and his daughter's mother. He states that he usually eats "a good breakfast," may eat a snack for lunch, then eats a "light dinner." Breakfast may include grits, sausage, and toast. Lunch may include a piece of fruit or peanut butter crackers. Dinner is usually a small portion of salad and chicken.  Pt reports no recent weight loss but endorses gradual weight loss  over time. He reports that his current UBW is 130-135 lbs. He states that many years ago, he weighed in the 180's. Pt reports that after his CABG in 1996, he lost weight down to the 160's and it continued to slowly decline to his current UBW.  RD to liberalize diet to Regular to provide more food options for pt. Will also order daily MVI with minerals. Pt reports that he does not want anything sweet due to concern about blood sugars and declines oral nutrition supplements at this time. RD to order double protein portions TID with meals.  Meal Completion: 100%  Medications reviewed and include: SSI, levemir 5 units daily, protonix, IV abx  Labs reviewed: sodium 131, BUN 75, creatinine 1.99, elevated LFTs, WBC 17.0, platelets 110, hemoglobin A1C 10.6 CBG's: 157-307 x 24 hours  UOP: 1950 ml x 24 hours I/O's: -2.4 L since admit  NUTRITION - FOCUSED PHYSICAL EXAM:  Flowsheet Row Most Recent Value  Orbital Region Moderate depletion  Upper Arm Region Moderate depletion  Thoracic and Lumbar Region Moderate depletion  Buccal Region Moderate depletion  Temple Region Moderate depletion  Clavicle Bone Region Moderate depletion  Clavicle and Acromion Bone Region Moderate depletion  Scapular Bone Region Moderate depletion  Dorsal Hand Moderate depletion  Patellar Region Moderate depletion  Anterior Thigh Region Severe depletion  Posterior Calf Region Severe depletion  Edema (RD Assessment) None  Hair Reviewed  Eyes Reviewed  Mouth Reviewed  Skin Reviewed  Nails Reviewed       Diet Order:   Diet Order  Diet regular Room service appropriate? Yes; Fluid consistency: Thin  Diet effective now                   EDUCATION NEEDS:   Education needs have been addressed  Skin:  Skin Assessment: Skin Integrity Issues: DTI: L foot  Last BM:  11/03/21  Height:   Ht Readings from Last 1 Encounters:  10/04/2021 5\' 10"  (1.778 m)    Weight:   Wt Readings from Last 1  Encounters:  11/04/21 64.2 kg    BMI:  Body mass index is 20.31 kg/m.  Estimated Nutritional Needs:   Kcal:  1800-2000  Protein:  85-100 grams  Fluid:  >1.8 L    Gustavus Bryant, MS, RD, LDN Inpatient Clinical Dietitian Please see AMiON for contact information.

## 2021-11-06 NOTE — Progress Notes (Signed)
? ?  Inpatient Rehab Admissions Coordinator : ? ?Per therapy recommendations, patient was screened for CIR candidacy by Stanislawa Gaffin RN MSN.  At this time patient appears to be a potential candidate for CIR. I will place a rehab consult per protocol for full assessment. Please call me with any questions. ? ?Joory Gough RN MSN ?Admissions Coordinator ?[phone] ?  ?

## 2021-11-06 NOTE — Evaluation (Signed)
Physical Therapy Evaluation Patient Details Name: Glenn [name] MRN: [id number] DOB: 06-13-1940 Today's Date: 11/06/2021  History of Present Illness  Patient is a 81 y/o male who presents on 5/31 with generalized weakness and falls. 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 PMH includes DM, HTN, MI, CAD s/p CABG, recent PPM placement and DCCV for Afib, CHF, CKD, BPH.  Clinical Impression  Pt admitted with above diagnosis. Pt currently presents with generalized weakness, decreased tolerance to activity due to pain, impaired mobility, and balance deficits. PTA, pt lived at home with daughter who helped with cooking and cleaning, but he was mod I for bed mobility, transfers, and ambulation with RW. Pt is currently min A for bed mobility, transfers, and ambulation for safety. Pt requires cues for safe use of RW for hand and RW placement. Pt educated on safe use of RW. Pt will benefit from skilled PT for safe use of DME and functional mobility training to increase their independence and safety with mobility to allow discharge home. Will continue to follow acutely.        Recommendations for follow up therapy are one component of a multi-disciplinary discharge planning process, led by the attending physician.  Recommendations may be updated based on patient status, additional functional criteria and insurance authorization.  Follow Up Recommendations Acute inpatient rehab (3hours/day)    Assistance Recommended at Discharge Frequent or constant Supervision/Assistance  Patient can return home with the following  A little help with walking and/or transfers;A little help with bathing/dressing/bathroom;Assistance with cooking/housework;Assist for transportation;Help with stairs or ramp for entrance    Equipment Recommendations None recommended by PT  Recommendations for Other Services        Functional Status Assessment Patient has had a recent decline in their functional status and demonstrates the ability to make significant improvements in function in a reasonable and predictable amount of time.     Precautions / Restrictions Precautions Precautions: Fall Restrictions Weight Bearing Restrictions: No      Mobility  Bed Mobility Overal bed mobility: Needs Assistance Bed Mobility: Rolling, Sidelying to Sit Rolling: Min assist Sidelying to sit: Min assist       General bed mobility comments: required cues for log rolling, min a with legs and trunk elevation    Transfers Overall transfer level: Needs assistance Equipment used: Rolling walker (2 wheels) Transfers: Sit to/from Stand Sit to Stand: Min assist           General transfer comment: from EOB x1, required cues for hand placement on EOB and RW.    Ambulation/Gait Ambulation/Gait assistance: Min assist Gait Distance (Feet): 10 Feet Assistive device: Rolling walker (2 wheels) Gait Pattern/deviations: Step-to pattern, Shuffle, Decreased stride length, Trunk flexed, Narrow base of support Gait velocity: decreased Gait velocity interpretation: <1.31 ft/sec, indicative of household ambulator   General Gait Details: pt with decreased gait speed and stride length, required constant cues for use of RW for safety while stating "let me do it", with min a for safety.  Stairs            Wheelchair Mobility    Modified Rankin (Stroke Patients Only)       Balance Overall balance assessment: Needs assistance Sitting-balance support: No upper extremity supported, Feet supported Sitting balance-Leahy Scale: Fair     Standing balance support: Reliant on assistive device for balance, Bilateral upper extremity supported Standing balance-Leahy Scale: Poor Standing balance comment: posterior  lean while standing, back of legs firmly against bed to prevent LOB, stated "don't want to fall backwards" pt able  to tolerate weight shifting and marching in place without LOB                             Pertinent Vitals/Pain Pain Assessment Pain Assessment: 0-10 Pain Score: 5  (in the back) Pain Descriptors / Indicators: Constant, Aching Pain Intervention(s): RN gave pain meds during session, Patient requesting pain meds-RN notified    Home Living Family/patient expects to be discharged to:: Private residence Living Arrangements: Children;Spouse/significant other (wife is paralyzed, unable to help him at home. daughter is a Pharmacist, hospital and able to help in the evenings and on weekends) Available Help at Discharge: Family Type of Home: House Home Access: Ramped entrance       Home Layout: One level Home Equipment: Conservation officer, nature (2 wheels);Wheelchair - manual;Cane - single point      Prior Function Prior Level of Function : Needs assist             Mobility Comments: can get around with RW ADLs Comments: daughters helps cook and clean     Hand Dominance        Extremity/Trunk Assessment   Upper Extremity Assessment Upper Extremity Assessment: Defer to OT evaluation    Lower Extremity Assessment Lower Extremity Assessment: Generalized weakness    Cervical / Trunk Assessment Cervical / Trunk Assessment: Other exceptions Cervical / Trunk Exceptions: forward head, rounded shoulders  Communication   Communication: No difficulties  Cognition Arousal/Alertness: Awake/alert Behavior During Therapy: WFL for tasks assessed/performed Overall Cognitive Status: Within Functional Limits for tasks assessed                                          General Comments      Exercises     Assessment/Plan    PT Assessment Patient needs continued PT services  PT Problem List Decreased strength;Decreased balance;Pain;Decreased mobility;Decreased knowledge of use of DME;Decreased activity tolerance;Decreased coordination       PT Treatment Interventions DME  instruction;Functional mobility training;Balance training;Gait training;Therapeutic activities;Therapeutic exercise;Patient/family education    PT Goals (Current goals can be found in the Care Plan section)  Acute Rehab PT Goals Patient Stated Goal: to go home PT Goal Formulation: With patient Time For Goal Achievement: 11/20/21 Potential to Achieve Goals: Good    Frequency Min 3X/week     Co-evaluation               AM-PAC PT "6 Clicks" Mobility  Outcome Measure Help needed turning from your back to your side while in a flat bed without using bedrails?: A Little Help needed moving from lying on your back to sitting on the side of a flat bed without using bedrails?: A Little Help needed moving to and from a bed to a chair (including a wheelchair)?: A Little Help needed standing up from a chair using your arms (e.g., wheelchair or bedside chair)?: A Little Help needed to walk in hospital room?: Total Help needed climbing 3-5 steps with a railing? : Total 6 Click Score: 14    End of Session Equipment Utilized During Treatment: Gait belt Activity Tolerance: No increased pain;Patient limited by fatigue;Patient limited by pain Patient left: in chair;with call bell/phone within reach;with chair alarm set Nurse Communication: Mobility status PT  Visit Diagnosis: Unsteadiness on feet (R26.81);Other abnormalities of gait and mobility (R26.89);Muscle weakness (generalized) (M62.81);Pain Pain - part of body:  (back)    Time: [id number] PT Time Calculation (min) (ACUTE ONLY): 34 min   Charges:   PT Evaluation $PT Eval Moderate Complexity: 1 Mod PT Treatments $Gait Training: 8-22 mins        Havery Moros, MS, [place] [facility] Office: [place] 11/06/2021, 4:05 PM

## 2021-11-06 NOTE — Progress Notes (Signed)
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  Duke Salvia Drue Second MD MPH [facility] [phone]

## 2021-11-06 NOTE — TOC Progression Note (Signed)
Transition of Care [facility]) - Progression Note    Patient Details  Name: Glenn [name] MRN: [id number] Date of Birth: 1940/08/31  Transition of Care [facility]) CM/SW Contact  Beckie Busing, RN Phone Number:[phone]  11/06/2021, 1:03 PM  Clinical Narrative:    TOC following for patient with high risk for readmission. CM at bedside to assess patient. Patient states that he comes from home where he lives with his wife and daughter. Per patient he was completely independent at home. Patient states that his PCP is at the [facility] and he also uses the [place] pharmacy. Patient states that he does have transportation to his appointments and that his medications are affordable. Patient reports that he has wheelchair, cane and walker at home.. There are currently no TOC needs. TOC will continue to follow.    Expected Discharge Plan: Home/Self Care Barriers to Discharge: Continued Medical Work up  Expected Discharge Plan and Services Expected Discharge Plan: Home/Self Care In-house Referral: NA Discharge Planning Services: CM Consult Post Acute Care Choice: NA Living arrangements for the past 2 months: Single Family Home                 DME Arranged: N/A DME Agency: NA       HH Arranged: NA HH Agency: NA         Social Determinants of Health (SDOH) Interventions    Readmission Risk Interventions    11/06/2021   12:28 PM  Readmission Risk Prevention Plan  Transportation Screening Complete  PCP or Specialist Appt within 3-5 Days Complete  HRI or Home Care Consult Complete  Social Work Consult for Recovery Care Planning/Counseling Complete  Palliative Care Screening Not Applicable  Medication Review Oceanographer) Referral to Pharmacy

## 2021-11-06 NOTE — Care Plan (Signed)
Spoke with daughter Blake Divine by phone.

## 2021-11-06 NOTE — Plan of Care (Signed)
  Problem: Coping: Goal: Ability to adjust to condition or change in health will improve Outcome: Progressing   Problem: Fluid Volume: Goal: Ability to maintain a balanced intake and output will improve Outcome: Not Progressing   Problem: Skin Integrity: Goal: Risk for impaired skin integrity will decrease Outcome: Progressing   Problem: Education: Goal: Knowledge of General Education information will improve Description: Including pain rating scale, medication(s)/side effects and non-pharmacologic comfort measures Outcome: Progressing

## 2021-11-06 NOTE — H&P (Signed)
Chief Complaint: Patient was seen in consultation today for osteomyelitis/discitis of L2-L3 at the request of Myrene Buddy, MD  Referring Physician(s): Myrene Buddy, MD  Supervising Physician: Arne Cleveland  Patient Status: [facility] - In-pt  History of Present Illness: Glenn [name] is a 81 y.o. male with PMH MI, CAD s/p CABG, DM, CKD stage III and HTN. Pt presented to [facility] c/o weakness x3 days, emesis and poor po intake x 2 day after fall 3 days PTA with difficulty ambulating. Pt was admitted for hypotension and WBC 24K. BC were + for K pneumoniae and urine cx + for many bacteria. CT abd questionable for L2-L3 discitis. 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 Pt was referred to IR for L2-L3 disc aspiration. Procedure was approved by Dr. Vernard Gambles, IR   Due to pt receiving Lovenox, Eliquis and ASA, procedure postponed until 11/09/21.   Past Medical History:  Diagnosis Date   Diabetes mellitus without complication ([place])    Exposure to Agent Orange    Hypertension    MI (myocardial infarction) ([place])    Renal disorder    kidney stones    Past Surgical History:  Procedure Laterality Date   BIOPSY PROSTATE     CORONARY ARTERY BYPASS GRAFT     x 4   KIDNEY STONE SURGERY      Allergies: Heparin  Medications: Prior to Admission medications   Medication Sig Start Date End Date Taking? Authorizing Provider  apixaban (ELIQUIS) 5 MG TABS tablet Take 5 mg by mouth 2 (two) times daily.   Yes [provider]  atorvastatin (LIPITOR) 80 MG tablet Take 80 mg by mouth at bedtime.   Yes [provider]  carvedilol (COREG) 12.5 MG tablet Take 12.5 mg by mouth 2 (two) times daily with a meal.   Yes [provider]  cetirizine (ZYRTEC) 10 MG chewable tablet Chew 10 mg by mouth daily.   Yes [provider]  empagliflozin (JARDIANCE) 25 MG TABS tablet Take 12.5 mg by mouth daily.   Yes [provider]  ferrous  sulfate 324 MG TBEC Take 324 mg by mouth in the morning and at bedtime.   Yes [provider]  finasteride (PROSCAR) 5 MG tablet Take 5 mg by mouth at bedtime.   Yes [provider]  hydroxypropyl methylcellulose / hypromellose (ISOPTO TEARS / GONIOVISC) 2.5 % ophthalmic solution Place 1 drop into both eyes 3 (three) times daily as needed for dry eyes.   Yes [provider]  hypromellose (GENTEAL) 0.3 % GEL ophthalmic ointment Place 1 application. into both eyes at bedtime.   Yes [provider]  insulin aspart protamine - aspart (NOVOLOG MIX 70/30 FLEXPEN) (70-30) 100 UNIT/ML FlexPen Inject 10 Units into the skin at bedtime.   Yes [provider]  ketotifen (ZADITOR) 0.025 % ophthalmic solution Place 1 drop into both eyes 2 (two) times daily.   Yes [provider]  latanoprost (XALATAN) 0.005 % ophthalmic solution Place 1 drop into both eyes at bedtime.   Yes [provider]  liraglutide (VICTOZA) 18 MG/3ML SOPN Inject 1.8 mg into the skin daily.   Yes [provider]  metFORMIN (GLUCOPHAGE) 500 MG tablet Take 500 mg by mouth 2 (two) times daily with a meal. Verified with [facility] 500 mg BID   Yes [provider]  pantoprazole (PROTONIX) 40 MG tablet Take 40 mg by mouth daily.   Yes [provider]  sacubitril-valsartan (ENTRESTO) 97-103 MG Take 0.5 tablets by  mouth every 12 (twelve) hours.   Yes [provider]  tamsulosin (FLOMAX) 0.4 MG CAPS capsule Take 0.8 mg by mouth at bedtime.   Yes [provider]  torsemide (DEMADEX) 20 MG tablet Take 20 mg by mouth See admin instructions. Take 1 tablet (20 mg) daily. Then if weight gain of 3 lbs or more take another tablet (20 mg).   Yes [provider]  insulin NPH-regular Human (NOVOLIN 70/30) (70-30) 100 UNIT/ML injection Inject 25 Units into the skin daily. Patient not taking: Reported on 11/02/2021    [provider]     No family  history on file.  Social History   Socioeconomic History   Marital status: Unknown    Spouse name: Not on file   Number of children: Not on file   Years of education: Not on file   Highest education level: Not on file  Occupational History   Not on file  Tobacco Use   Smoking status: Former   Smokeless tobacco: Never  Substance and Sexual Activity   Alcohol use: Yes    Comment: rarely   Drug use: Not on file   Sexual activity: Not on file  Other Topics Concern   Not on file  Social History Narrative   Not on file   Social Determinants of Health   Financial Resource Strain: Not on file  Food Insecurity: Not on file  Transportation Needs: Not on file  Physical Activity: Not on file  Stress: Not on file  Social Connections: Not on file    Review of Systems: A 12 point ROS discussed and pertinent positives are indicated in the HPI above.  All other systems are negative.  Review of Systems  Constitutional:  Negative for chills and fever.  Respiratory:  Negative for shortness of breath.   Cardiovascular:  Negative for chest pain.  Gastrointestinal:  Negative for abdominal pain.  Musculoskeletal:  Positive for back pain.   Vital Signs: BP 113/85   Pulse (!) 101   Temp 98.7 F (37.1 C) (Oral)   Resp 20   Wt 141 lb 8.6 oz (64.2 kg)   SpO2 97%   BMI 20.31 kg/m   Physical Exam Vitals reviewed.  Constitutional:      General: He is not in acute distress.    Appearance: Normal appearance. He is not ill-appearing.  HENT:     Mouth/Throat:     Mouth: Mucous membranes are moist.     Pharynx: Oropharynx is clear.  Eyes:     Extraocular Movements: Extraocular movements intact.     Pupils: Pupils are equal, round, and reactive to light.  Cardiovascular:     Rate and Rhythm: Regular rhythm. Tachycardia present.     Pulses: Normal pulses.     Heart sounds: Normal heart sounds.  Pulmonary:     Effort: Pulmonary effort is normal. No respiratory distress.     Breath  sounds: Wheezing present.  Abdominal:     General: Bowel sounds are normal. There is no distension.     Palpations: Abdomen is soft.     Tenderness: There is no abdominal tenderness. There is no guarding.  Skin:    General: Skin is warm and dry.  Neurological:     Mental Status: He is alert and oriented to person, place, and time.  Psychiatric:        Mood and Affect: Mood normal.        Behavior: Behavior normal.  Thought Content: Thought content normal.        Judgment: Judgment normal.    Imaging: CT ABDOMEN PELVIS WO CONTRAST  Result Date: 10/30/2021 CLINICAL DATA:  A 81 year old male presents with blunt abdominal trauma by report. Question of sepsis and weakness, also potentially hit lower back and having pain in the buttocks. EXAM: CT ABDOMEN AND PELVIS WITHOUT CONTRAST TECHNIQUE: Multidetector CT imaging of the abdomen and pelvis was performed following the standard protocol without IV contrast. RADIATION DOSE REDUCTION: This exam was performed according to the departmental dose-optimization program which includes automated exposure control, adjustment of the mA and/or kV according to patient size and/or use of iterative reconstruction technique. COMPARISON:  December 21, 2016. FINDINGS: Lower chest: Basilar atelectasis and or scarring at the lung bases. Heart size is enlarged as before. Signs of multi lead pacer device in prior percutaneous coronary intervention as well as sternotomy. Hepatobiliary: Liver with smooth contours. Gallbladder with similar degree of moderate distension and cholelithiasis in the dependent portion the gallbladder. No gross pericholecystic stranding. Pancreas: No gross pancreatic inflammation, limited assessment due to lack of intravenous contrast. Spleen: Normal without signs of adjacent stranding. Adrenals/Urinary Tract: Adrenal glands are normal. Mild LEFT ureteral dilation and mild LEFT hydronephrosis. Mild fullness of RIGHT renal pelvis and ureteral  dilation in the setting of marked distension of the urinary bladder. Urinary bladder is not as distended as on the study of December 21, 2016 though still extends well into the abdomen, to approximately the L3 level on the sagittal reconstructions. Renal contours are similar to prior imaging. No marked perinephric stranding. Stomach/Bowel: No signs of bowel obstruction. Generalized mesenteric edema. The appendix is not visible, no focal signs about the cecum to suggest appendiceal inflammation. Vascular/Lymphatic: Aortic atherosclerosis. There is no gastrohepatic or hepatoduodenal ligament lymphadenopathy. No retroperitoneal or mesenteric lymphadenopathy. No pelvic sidewall lymphadenopathy. Reproductive: Prostatomegaly as before. Other: Gas locules within the LEFT psoas muscle. Small gas locules adjacent to the IVC. Gas within the disc space at the L2-L3 level with new areas of irregularity and disc space narrowing along the L2-3 disc space. Small locules of gas in the epidural space posteriorly. Retroperitoneal gas is greatest on the LEFT within the LEFT psoas. No signs of fracture or traumatic subluxation in the spine. Phenomenon Musculoskeletal: As above mild body wall edema. IMPRESSION: 1. Gas within the disc space at L2-3 and dissemination of gas into the LEFT psoas and RIGHT retroperitoneum as well as potentially in the epidural space posterior to L2. Findings raise the question of discitis/osteomyelitis with gas-forming organism. Sampling of this area or MRI as possible may be helpful for further evaluation. 2. Signs of marked urinary bladder distension compatible with bladder outlet obstruction with less collecting system dilation than on previous imaging. Correlate with signs of urinary tract infection and or urosepsis. 3. Generalized mesenteric and body wall edema. 4. Cholelithiasis without evidence of acute cholecystitis. 5. Marked cardiomegaly with pacer defibrillator. Aortic Atherosclerosis (ICD10-I70.0).  Electronically Signed   By: Zetta Bills M.D.   On: 10/29/2021 18:17   CT HEAD WO CONTRAST (5MM)  Result Date: 10/26/2021 CLINICAL DATA:  Head trauma, moderate-severe EXAM: CT HEAD WITHOUT CONTRAST TECHNIQUE: Contiguous axial images were obtained from the base of the skull through the vertex without intravenous contrast. RADIATION DOSE REDUCTION: This exam was performed according to the departmental dose-optimization program which includes automated exposure control, adjustment of the mA and/or kV according to patient size and/or use of iterative reconstruction technique. COMPARISON:  None Available. BRAIN: BRAIN  Cerebral ventricle sizes are concordant with the degree of cerebral volume loss. Patchy and confluent areas of decreased attenuation are noted throughout the deep and periventricular white matter of the cerebral hemispheres bilaterally, compatible with chronic microvascular ischemic disease. No evidence of large-territorial acute infarction. No parenchymal hemorrhage. No mass lesion. No extra-axial collection. No mass effect or midline shift. No hydrocephalus. Cavum septum pellucidum variant noted. Basilar cisterns are patent. Vascular: No hyperdense vessel. Atherosclerotic calcifications are present within the cavernous internal carotid arteries. Skull: No acute fracture or focal lesion. Sinuses/Orbits: Almost complete opacification of bilateral sphenoid sinuses. Hyperdense debris noted within the sphenoid sinuses. Paranasal sinuses and mastoid air cells are clear. Bilateral lens replacement. The orbits are unremarkable. Other: None. IMPRESSION: 1. No acute intracranial abnormality. 2. 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 Electronically Signed   By: Iven Finn M.D.   On: 10/23/2021 18:08   MR LUMBAR SPINE WO CONTRAST  Result Date: 11/03/2021 CLINICAL DATA:  Bacteremia.  Suspected osteomyelitis discitis. EXAM: MRI LUMBAR SPINE WITHOUT  CONTRAST TECHNIQUE: Multiplanar, multisequence MR imaging of the lumbar spine was performed. No intravenous contrast was administered. COMPARISON:  CT abdomen pelvis dated Nov 01, 2021. FINDINGS: Segmentation: Transitional lumbosacral anatomy with partial sacralization of L5 on the left. Alignment:  Physiologic. Vertebrae: Abnormal fluid within the L2-L3 disc space with surrounding endplate irregularity and marrow edema, consistent with osteomyelitis discitis. Small amount of anterior epidural phlegmon at L2 and L3 without fluid collection. No fracture or suspicious bone lesion. Conus medullaris and cauda equina: Conus extends to the L1-L2 level. Conus and cauda equina appear normal. Paraspinal and other soft tissues: Left-greater-than-right psoas muscle edema with tiny foci gas. No drainable fluid collection. Chronically distended, trabeculated bladder again noted. Disc levels: T12-L1:  Negative. L1-L2: Mild disc bulging and bilateral facet arthropathy. No stenosis. L2-L3: Osteomyelitis discitis. Mild disc bulging and bilateral facet arthropathy. Mild spinal canal and bilateral neuroforaminal stenosis. L3-L4: Mild disc bulging and bilateral facet arthropathy. Mild bilateral neuroforaminal stenosis. No spinal canal stenosis. L4-L5: Mild disc bulging with small left paracentral annular fissure. Mild bilateral facet arthropathy. Mild right neuroforaminal stenosis. No spinal canal or left neuroforaminal stenosis. L5-S1: No significant disc bulge or herniation. Bridging foraminal endplate osteophytes. No stenosis. IMPRESSION: 1. Osteomyelitis discitis at L2-L3 with small anterior epidural phlegmon. No discrete abscess. 2. Left-greater-than-right psoas muscle edema with tiny foci of gas. No drainable fluid collection. 3. Mild multilevel lumbar spondylosis as described above. Electronically Signed   By: Titus Dubin M.D.   On: 11/03/2021 15:15   US RENAL  Result Date: 11/02/2021 CLINICAL DATA:  Acute kidney injury.  EXAM: RENAL / URINARY TRACT ULTRASOUND COMPLETE COMPARISON:  Nov 01, 2021. FINDINGS: Right Kidney: Renal measurements: 11.7 x 5.6 x 4.4 cm = volume: 150 mL. Increased echogenicity of renal parenchyma is noted. No mass or hydronephrosis visualized. Left Kidney: Renal measurements: 13.3 x 6.3 x 5.3 cm = volume: 234 mL. Increased echogenicity of renal parenchyma is noted. 1 cm simple cyst is noted which requires no further follow-up. No mass or hydronephrosis visualized. Bladder: Foley catheter is noted. Moderate bladder wall thickening is noted which most likely is due to lack of distension. No ureteral jets are visualized. Other: Minimal amount of free fluid is noted around the liver. IMPRESSION: Increased echogenicity of renal parenchyma is noted suggesting medical renal disease. No hydronephrosis or renal obstruction is noted. Minimal ascites is noted around the liver. Electronically Signed   By: Marijo Conception  M.D.   On: 11/02/2021 09:59   MR SACRUM SI JOINTS WO CONTRAST  Result Date: 11/03/2021 CLINICAL DATA:  Low back pain, infection or inflammation suspected (Ped 0-17y) EXAM: MRI SACRUM WITHOUT CONTRAST TECHNIQUE: Multiplanar, multisequence MR imaging of the sacrum was performed. No intravenous contrast was administered. COMPARISON:  CT 10/19/2021 FINDINGS: Bones/Joint/Cartilage Sacrum and coccyx intact. No fracture. Bilateral SI joints are normal in appearance. No SI joint effusion. No erosion. Visualized pelvis is intact. Trace bilateral hip joint effusions. No femoral head avascular necrosis. Ligaments Intact. Muscles and Tendons Extensive intramuscular edema involving the included iliopsoas and bilateral gluteal musculature. No organized or drainable intramuscular fluid collections. Included tendinous structures appear intact. Soft tissues Generalized anasarca. Small volume layering free fluid within the pelvis. Chronically thick-walled, trabeculated urinary bladder. IMPRESSION: 1. No acute osseous  abnormality of the sacrum. No evidence of sacroiliitis. 2. Extensive intramuscular edema involving the included iliopsoas and bilateral gluteal musculature, suspicious for myositis. No organized or drainable intramuscular fluid collections. 3. Trace bilateral hip joint effusions, nonspecific and likely reactive. 4. Generalized anasarca. 5. Chronically thick-walled, trabeculated urinary bladder. Electronically Signed   By: Davina Poke D.O.   On: 11/03/2021 15:36   DG CHEST PORT 1 VIEW  Result Date: 11/02/2021 CLINICAL DATA:  Central line placement EXAM: PORTABLE CHEST 1 VIEW COMPARISON:  10/11/2021 FINDINGS: Interval placement of right IJ central line with distal tip terminating at the level of the mid right atrium. Left-sided implanted cardiac device. Stable heart size status post CABG. Chronic elevation of the right hemidiaphragm. Right basilar atelectasis. Previously seen nodular opacity in the right lung base is no longer evident. Left lung is clear. No pleural effusion. No pneumothorax. IMPRESSION: 1. Interval placement of right IJ central line with distal tip terminating at the level of the mid right atrium. No pneumothorax. 2. Chronic elevation of the right hemidiaphragm with right basilar atelectasis. 3. Previously described nodular opacity at the right lung base is no longer evident, compatible with a focus of atelectasis. Electronically Signed   By: Davina Poke D.O.   On: 11/02/2021 15:49   DG Chest Port 1 View  Result Date: 10/02/2021 CLINICAL DATA:  Possible sepsis EXAM: PORTABLE CHEST 1 VIEW COMPARISON:  09/04/2016 FINDINGS: Left-sided implanted cardiac device. Heart size within normal limits status post median sternotomy and CABG. Aortic atherosclerosis. Low lung volumes. Small focal somewhat nodular opacity at the right lung base. No pleural effusion or pneumothorax. IMPRESSION: Small focal opacity at the right lung base. Findings could represent atelectasis, infection, versus  pulmonary nodule. Recommend repeat inspiratory PA and lateral radiographs of the chest to see if this finding persists. Electronically Signed   By: Davina Poke D.O.   On: 10/26/2021 17:00   DG Foot Complete Left  Result Date: 10/10/2021 CLINICAL DATA:  Left foot wound. EXAM: LEFT FOOT - COMPLETE 3+ VIEW COMPARISON:  None Available. FINDINGS: There is severe great toe metatarsophalangeal joint space narrowing and peripheral osteophytosis with moderate subchondral sclerosis. Mild-to-moderate joint space narrowing of the interphalangeal joints diffusely. Small plantar calcaneal heel spur. Mild chronic spurring at the Achilles insertion on the calcaneus. Mild vascular calcifications. Mild soft tissue swelling/enlargement lateral to the fifth metatarsophalangeal joint with convex lateral border. No acute fracture or dislocation. IMPRESSION: 1. Severe great toe metatarsophalangeal joint osteoarthritis. 2. Mild soft tissue swelling/enlarged and lateral to the fifth metatarsophalangeal joint with convex lateral border. Recommend clinical correlation for reported wound versus possible mass. 3. Small plantar calcaneal heel spur. Electronically Signed   By: Jori Moll  Viola M.D.   On: 10/20/2021 17:40   ECHOCARDIOGRAM COMPLETE  Result Date: 11/02/2021    ECHOCARDIOGRAM REPORT   Patient Name:   Jamari Hudnall Date of Exam: 11/02/2021 Medical Rec #:  [id number]      Height:       70.0 in Accession #:    [id number]     Weight:       131.8 lb Date of Birth:  06-Feb-1941      BSA:          1.748 m Patient Age:    63 years       BP:           95/64 mmHg Patient Gender: M              HR:           84 bpm. Exam Location:  Inpatient Procedure: 2D Echo, Cardiac Doppler and Color Doppler Indications:    Elevated troponin  History:        Patient has no prior history of Echocardiogram examinations.                 CHF, CAD, Pacemaker; Risk Factors:Diabetes and Hypertension.  Sonographer:    Joette Catching RCS Referring Phys:  [id number] [place]  1. There are some endomyocardial trabeculations noted but does not meet criteria for left ventricular non-compaction. Left ventricular ejection fraction, by estimation, is <20%. The left ventricle has severely decreased function. The left ventricle has no regional wall motion abnormalities. Left ventricular diastolic parameters are consistent with Grade II diastolic dysfunction (pseudonormalization). There is the interventricular septum is flattened in systole and diastole, consistent with right ventricular pressure and volume overload.  2. Right ventricular systolic function is normal. The right ventricular size is normal.  3. The mitral valve is normal in structure. Mild mitral valve regurgitation. No evidence of mitral stenosis.  4. Tricuspid valve regurgitation is moderate.  5. The aortic valve is tricuspid. Aortic valve regurgitation is not visualized. No aortic stenosis is present.  6. There is borderline dilatation of the aortic root, measuring 37 mm.  7. The inferior vena cava is normal in size with greater than 50% respiratory variability, suggesting right atrial pressure of 3 mmHg. Comparison(s): No prior Echocardiogram. FINDINGS  Left Ventricle: There are some endomyocardial trabeculations noted but does not meet criteria for left ventricular non-compaction. Left ventricular ejection fraction, by estimation, is <20%. The left ventricle has severely decreased function. The left ventricle has no regional wall motion abnormalities. The left ventricular internal cavity size was normal in size. There is no left ventricular hypertrophy. The interventricular septum is flattened in systole and diastole, consistent with right ventricular pressure and volume overload. Left ventricular diastolic parameters are consistent with Grade II diastolic dysfunction (pseudonormalization). Right Ventricle: The right ventricular size is normal. No increase in right ventricular wall  thickness. Right ventricular systolic function is normal. Left Atrium: Left atrial size was normal in size. Right Atrium: Right atrial size was normal in size. Pericardium: There is no evidence of pericardial effusion. Mitral Valve: The mitral valve is normal in structure. Mild mitral valve regurgitation. No evidence of mitral valve stenosis. Tricuspid Valve: The tricuspid valve is normal in structure. Tricuspid valve regurgitation is moderate . No evidence of tricuspid stenosis. Aortic Valve: The aortic valve is tricuspid. Aortic valve regurgitation is not visualized. No aortic stenosis is present. Aortic valve mean gradient measures 2.0 mmHg. Aortic valve peak gradient measures 3.6 mmHg. Aortic valve area,  by VTI measures 2.26 cm. Pulmonic Valve: The pulmonic valve was normal in structure. Pulmonic valve regurgitation is mild. No evidence of pulmonic stenosis. Aorta: There is borderline dilatation of the aortic root, measuring 37 mm. Venous: The inferior vena cava is normal in size with greater than 50% respiratory variability, suggesting right atrial pressure of 3 mmHg. IAS/Shunts: No atrial level shunt detected by color flow Doppler. Additional Comments: A device lead is visualized.  LEFT VENTRICLE PLAX 2D LVIDd:         4.90 cm      Diastology LVIDs:         4.80 cm      LV e' medial:    3.77 cm/s LV PW:         1.00 cm      LV E/e' medial:  16.0 LV IVS:        1.20 cm      LV e' lateral:   6.85 cm/s LVOT diam:     2.30 cm      LV E/e' lateral: 8.8 LV SV:         33 LV SV Index:   19 LVOT Area:     4.15 cm  LV Volumes (MOD) LV vol d, MOD A2C: 111.0 ml LV vol d, MOD A4C: 81.2 ml LV vol s, MOD A2C: 93.6 ml LV vol s, MOD A4C: 64.6 ml LV SV MOD A2C:     17.4 ml LV SV MOD A4C:     81.2 ml LV SV MOD BP:      16.5 ml RIGHT VENTRICLE            IVC RV Basal diam:  3.40 cm    IVC diam: 1.90 cm RV Mid diam:    2.10 cm RV S prime:     6.40 cm/s TAPSE (M-mode): 1.0 cm LEFT ATRIUM             Index        RIGHT ATRIUM            Index LA diam:        4.20 cm 2.40 cm/m   RA Area:     16.50 cm LA Vol (A2C):   68.5 ml 39.18 ml/m  RA Volume:   40.90 ml  23.39 ml/m LA Vol (A4C):   45.7 ml 26.14 ml/m LA Biplane Vol: 56.0 ml 32.03 ml/m  AORTIC VALVE                    PULMONIC VALVE AV Area (Vmax):    2.34 cm     PV Vmax:          0.63 m/s AV Area (Vmean):   2.15 cm     PV Peak grad:     1.6 mmHg AV Area (VTI):     2.26 cm     PR End Diast Vel: 10.24 msec AV Vmax:           95.40 cm/s AV Vmean:          75.700 cm/s AV VTI:            0.148 m AV Peak Grad:      3.6 mmHg AV Mean Grad:      2.0 mmHg LVOT Vmax:         53.80 cm/s LVOT Vmean:        39.100 cm/s LVOT VTI:          0.081 m LVOT/AV  VTI ratio: 0.54  AORTA Ao Root diam: 3.70 cm Ao Asc diam:  3.30 cm MITRAL VALVE                  TRICUSPID VALVE MV Area (PHT): 8.25 cm       TR Peak grad:   32.9 mmHg MV Decel Time: 92 msec        TR Vmax:        287.00 cm/s MR Peak grad:    53.9 mmHg MR Mean grad:    33.0 mmHg    SHUNTS MR Vmax:         367.00 cm/s  Systemic VTI:  0.08 m MR Vmean:        273.0 cm/s   Systemic Diam: 2.30 cm MR PISA:         1.01 cm MR PISA Eff ROA: 10 mm MR PISA Radius:  0.40 cm MV E velocity: 60.40 cm/s MV A velocity: 63.00 cm/s MV E/A ratio:  0.96 Kardie Tobb DO Electronically signed by Berniece Salines DO Signature Date/Time: 11/02/2021/4:14:53 PM    Final    [place] EKG SITE RITE  Result Date: 11/04/2021 If Site Rite image not attached, placement could not be confirmed due to current cardiac rhythm.   Labs:  CBC: Recent Labs    11/03/21 0330 11/04/21 0516 11/05/21 0513 11/06/21 0125  WBC 22.4* 15.9* 13.9* 17.0*  HGB 11.5* 11.8* 11.8* 11.7*  HCT 32.2* 33.7* 34.0* 33.5*  PLT 78* 82* 83* 110*    COAGS: Recent Labs    10/03/2021 1817 11/02/21 0119 11/03/21 0330 11/05/21 1000 11/06/21 0125  INR 3.1* 2.6* 1.7* 1.4* 1.6*  APTT 46*  --   --   --   --     BMP: Recent Labs    11/03/21 0330 11/03/21 1036 11/04/21 0516 11/05/21 0513  11/06/21 0125  NA 133*  --  133* 130* 131*  K 3.0* 3.3* 3.6 4.4 4.5  CL 103  --  103 101 104  CO2 21*  --  22 19* 21*  GLUCOSE 75  --  56* 270* 213*  BUN 98*  --  80* 75* 75*  CALCIUM 8.3*  --  8.6* 8.5* 8.7*  CREATININE 2.26*  --  1.80* 1.90* 1.99*  GFRNONAA 29*  --  38* 36* 34*    LIVER FUNCTION TESTS: Recent Labs    11/02/21 0119 11/03/21 0330 11/04/21 0516 11/05/21 0513  BILITOT 1.0 0.9 0.7 0.6  AST 106* 203* 121* 70*  ALT 80* 146* 116* 82*  ALKPHOS 216* 306* 281* 336*  PROT 6.1* 5.7* 5.7* 5.8*  ALBUMIN 2.5* 2.1* 1.9* 1.9*    TUMOR MARKERS: No results for input(s): AFPTM, CEA, CA199, CHROMGRNA in the last 8760 hours.  Assessment and Plan: History of MI, CAD s/p CABG, DM, CKD stage III and HTN. Pt presented to [facility] c/o weakness x3 days, emesis and poor po intake x 2 day after fall 3 days PTA with difficulty ambulating. Pt was admitted for hypotension and WBC 24K. BC were + for K pneumoniae and urine cx + for many bacteria. CT abd questionable for L2-L3 discitis. 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 Pt was referred to IR for L2-L3 disc aspiration. Procedure was approved by Dr. Vernard Gambles, [place].  Due to pt receiving Lovenox, Eliquis and ASA, procedure postponed until 11/09/21.   Pt resting in bed. He is A&O, calm and pleasant. He is in no distress.  Pt understands that he will be NPO  at [place] prior to 6/8 procedure.  Eliquis and ASA have been held.  Lovenox to be held the day prior.   Risks and benefits of L2-L3 disc aspiration with moderate sedation was discussed with the patient and/or patient's family including, but not limited to bleeding, infection, damage to adjacent structures or low yield requiring additional tests.  All of the questions were answered and there is agreement to proceed.  Consent signed and in chart.   Thank you for this interesting consult.  I greatly enjoyed meeting Gillermo Paller and look forward to participating in their  care.  A copy of this report was sent to the requesting provider on this date.  Electronically Signed: Tyson Alias, NP 11/06/2021, 1:41 PM   I spent a total of 20 minutes in face to face in clinical consultation, greater than 50% of which was counseling/coordinating care for osteomyelitis/discitis of L2-L3.

## 2021-11-07 DIAGNOSIS — M4646 Discitis, unspecified, lumbar region: Secondary | ICD-10-CM | POA: Diagnosis not present

## 2021-11-07 DIAGNOSIS — R7881 Bacteremia: Secondary | ICD-10-CM | POA: Diagnosis not present

## 2021-11-07 DIAGNOSIS — A419 Sepsis, unspecified organism: Secondary | ICD-10-CM | POA: Diagnosis not present

## 2021-11-07 DIAGNOSIS — N179 Acute kidney failure, unspecified: Secondary | ICD-10-CM | POA: Diagnosis not present

## 2021-11-07 DIAGNOSIS — N189 Chronic kidney disease, unspecified: Secondary | ICD-10-CM | POA: Diagnosis not present

## 2021-11-07 DIAGNOSIS — R6521 Severe sepsis with septic shock: Secondary | ICD-10-CM | POA: Diagnosis not present

## 2021-11-07 LAB — CBC
HCT: 30 % — ABNORMAL LOW (ref 39.0–52.0)
Hemoglobin: 9.8 g/dL — ABNORMAL LOW (ref 13.0–17.0)
MCH: 26.9 pg (ref 26.0–34.0)
MCHC: 32.7 g/dL (ref 30.0–36.0)
MCV: 82.4 fL (ref 80.0–100.0)
Platelets: 146 10*3/uL — ABNORMAL LOW (ref 150–400)
RBC: 3.64 MIL/uL — ABNORMAL LOW (ref 4.22–5.81)
RDW: 16.6 % — ABNORMAL HIGH (ref 11.5–15.5)
WBC: 14.1 10*3/uL — ABNORMAL HIGH (ref 4.0–10.5)
nRBC: 0 % (ref 0.0–0.2)

## 2021-11-07 LAB — BASIC METABOLIC PANEL
Anion gap: 6 (ref 5–15)
BUN: 64 mg/dL — ABNORMAL HIGH (ref 8–23)
CO2: 21 mmol/L — ABNORMAL LOW (ref 22–32)
Calcium: 8.6 mg/dL — ABNORMAL LOW (ref 8.9–10.3)
Chloride: 106 mmol/L (ref 98–111)
Creatinine, Ser: 1.61 mg/dL — ABNORMAL HIGH (ref 0.61–1.24)
GFR, Estimated: 44 mL/min — ABNORMAL LOW (ref >60)
Glucose, Bld: 199 mg/dL — ABNORMAL HIGH (ref 70–99)
Potassium: 4.3 mmol/L (ref 3.5–5.1)
Sodium: 133 mmol/L — ABNORMAL LOW (ref 135–145)

## 2021-11-07 LAB — GLUCOSE, CAPILLARY
Glucose-Capillary: 43 mg/dL — CL (ref 70–99)
Glucose-Capillary: 211 mg/dL — ABNORMAL HIGH (ref 70–99)
Glucose-Capillary: 254 mg/dL — ABNORMAL HIGH (ref 70–99)
Glucose-Capillary: 186 mg/dL — ABNORMAL HIGH (ref 70–99)
Glucose-Capillary: 202 mg/dL — ABNORMAL HIGH (ref 70–99)

## 2021-11-07 MED ORDER — TORSEMIDE 20 MG PO TABS
20.0000 mg | ORAL_TABLET | Freq: Every day | ORAL | Status: DC
  Administered 2021-11-07 – 2021-11-11 (×5): 20 mg via ORAL
  Filled 2021-11-07 (×5): qty 1

## 2021-11-07 NOTE — Assessment & Plan Note (Signed)
-   Consult WOC 

## 2021-11-07 NOTE — Progress Notes (Signed)
Physical Therapy Treatment Patient Details Name: Glenn [name] MRN: [id number] DOB: 1940/10/01 Today's Date: 11/07/2021   History of Present Illness Patient is a 81 y/o male who presents on 5/31 with generalized weakness and falls. 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 PMH includes DM, HTN, MI, CAD s/p CABG, recent PPM placement and DCCV for Afib, CHF, CKD, BPH. L2-L3 disc aspiration planned for 11/09/21.    PT Comments    Pt slowly progressing towards PT goals. Pt continues to present with limited activity tolerance, balance deficits, and generalized weakness. Pt received in recliner, therefore today's session focused on functional mobility training, and pt tolerated 20 ft of ambulation with min A for safety, did not require cues for safe use of RW. Pt continued to require min A for transfers. Pt continues to be appropriate for CIR as well as skilled therapy needed to improve independence and mobility for discharge. Will follow acutely.   Recommendations for follow up therapy are one component of a multi-disciplinary discharge planning process, led by the attending physician.  Recommendations may be updated based on patient status, additional functional criteria and insurance authorization.  Follow Up Recommendations  Acute inpatient rehab (3hours/day)     Assistance Recommended at Discharge Frequent or constant Supervision/Assistance  Patient can return home with the following A little help with walking and/or transfers;A little help with bathing/dressing/bathroom;Assistance with cooking/housework;Assist for transportation;Help with stairs or ramp for entrance   Equipment Recommendations  None recommended by PT    Recommendations for Other Services       Precautions / Restrictions Precautions Precautions: Fall Restrictions Weight Bearing Restrictions: No     Mobility   Bed Mobility               General bed mobility comments: pt recieved sitting up in chair.    Transfers Overall transfer level: Needs assistance Equipment used: Rolling walker (2 wheels) Transfers: Sit to/from Stand Sit to Stand: Min assist           General transfer comment: from chair x1. pt tried standing independently with min guard for safety, unable to fully stand and required min A to stand    Ambulation/Gait Ambulation/Gait assistance: Min assist   Assistive device: Rolling walker (2 wheels) Gait Pattern/deviations: Step-to pattern, Shuffle, Decreased stride length, Trunk flexed, Narrow base of support Gait velocity: decreased Gait velocity interpretation: <1.31 ft/sec, indicative of household ambulator   General Gait Details: pt performed weight shifting and marching in place for 1 min prior to ambulation. pt with continued decrease in gait speed and stride length, did not require cues for safe use of RW in today's session. required min A during ambulation for safety.   Stairs             Wheelchair Mobility    Modified Rankin (Stroke Patients Only)       Balance Overall balance assessment: Needs assistance Sitting-balance support: No upper extremity supported, Feet supported Sitting balance-Leahy Scale: Fair Sitting balance - Comments: required min guard for safety   Standing balance support: Reliant on assistive device for balance, Bilateral upper extremity supported Standing balance-Leahy Scale: Poor Standing balance comment: pt requiring min A for safety and support from RW                            Cognition Arousal/Alertness: Awake/alert Behavior During Therapy: [facility] for tasks  assessed/performed Overall Cognitive Status: Within Functional Limits for tasks assessed                                          Exercises      General Comments        Pertinent Vitals/Pain Pain Assessment Pain Assessment:  Faces Faces Pain Scale: Hurts a little bit Pain Location: back, pt stated "only hurts when I move" Pain Descriptors / Indicators: Constant, Aching Pain Intervention(s): Limited activity within patient's tolerance, Monitored during session, Premedicated before session    Home Living                          Prior Function            PT Goals (current goals can now be found in the care plan section) Acute Rehab PT Goals Patient Stated Goal: to go home PT Goal Formulation: With patient Time For Goal Achievement: 11/20/21 Potential to Achieve Goals: Good Progress towards PT goals: Progressing toward goals    Frequency    Min 3X/week      PT Plan Current plan remains appropriate    Co-evaluation              AM-PAC PT "6 Clicks" Mobility   Outcome Measure  Help needed turning from your back to your side while in a flat bed without using bedrails?: A Little Help needed moving from lying on your back to sitting on the side of a flat bed without using bedrails?: A Little Help needed moving to and from a bed to a chair (including a wheelchair)?: A Little Help needed standing up from a chair using your arms (e.g., wheelchair or bedside chair)?: A Little Help needed to walk in hospital room?: Total Help needed climbing 3-5 steps with a railing? : Total 6 Click Score: 14    End of Session Equipment Utilized During Treatment: Gait belt Activity Tolerance: No increased pain;Patient tolerated treatment well;Patient limited by fatigue Patient left: in chair;with call bell/phone within reach (pt left in transport WC because he was being moved to 73M) Nurse Communication: Mobility status PT Visit Diagnosis: Unsteadiness on feet (R26.81);Other abnormalities of gait and mobility (R26.89);Muscle weakness (generalized) (M62.81);Pain Pain - part of body:  (back)     Time: 1341-1400 PT Time Calculation (min) (ACUTE ONLY): 19 min  Charges:  $Gait Training: 8-22  mins                     Havery Moros, MS, [place] Office: [place] 11/07/2021, 3:11 PM

## 2021-11-07 NOTE — Progress Notes (Signed)
[place] for Infectious Disease    Date of Admission:  10/21/2021   Total days of antibiotics 7/ day 4 cefazolin          ID: Jamarquis Crull is a 81 y.o. male with klebsiella lumbar discitis and psoas abscess with secondary bacteremia Principal Problem:   Septic shock ([place]) Active Problems:   Bacteremia due to Klebsiella pneumoniae   Acute kidney injury ([place])   Bladder outlet obstruction   Uncontrolled type 2 diabetes mellitus with hyperglycemia, with long-term current use of insulin (HCC)   Pacemaker   Discitis   Osteomyelitis of lumbar spine (HCC)   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HCC)   Chronic kidney disease, stage 3b (HCC)   Coronary artery disease involving native coronary artery of native heart without angina pectoris   Chronic combined systolic and diastolic CHF (congestive heart failure) (HCC)   Hyponatremia   Hypokalemia   Transaminitis   Myocardial injury   Thrombocytopenia (HCC)   Anemia due to chronic kidney disease   Coagulopathy (HCC)   Protein-calorie malnutrition, moderate (HCC)   Paroxysmal atrial fibrillation (HCC)   Foot ulcer (HCC)    Subjective: Afebrile. Poor sleep overnight otherwise tolerating abtx ROS- 12 point ros is negative  Medications:   artificial tears   Both Eyes QHS   atorvastatin  80 mg Oral QHS   carvedilol  6.25 mg Oral BID WC   chlorhexidine  15 mL Mouth Rinse BID   Chlorhexidine Gluconate Cloth  6 each Topical Q0600   finasteride  5 mg Oral Daily   insulin aspart  0-15 Units Subcutaneous TID WC   insulin aspart  0-5 Units Subcutaneous QHS   insulin detemir  5 Units Subcutaneous Daily   ketotifen  1 drop Both Eyes BID   latanoprost  1 drop Both Eyes QHS   mouth rinse  15 mL Mouth Rinse q12n4p   multivitamin with minerals  1 tablet Oral Daily   pantoprazole  40 mg Oral Q1200   sodium chloride flush  10-40 mL Intracatheter Q12H   sodium chloride flush  10-40 mL Intracatheter Q12H   tamsulosin  0.8 mg Oral QHS   torsemide  20 mg  Oral Daily    Objective: Vital signs in last 24 hours: Temp:  [97.8 F (36.6 C)-98.7 F (37.1 C)] 97.8 F (36.6 C) (06/06 1126) Pulse Rate:  [83-95] 83 (06/06 1126) Resp:  [16-23] 18 (06/06 1126) BP: (99-116)/(54-69) 99/54 (06/06 1126) SpO2:  [90 %-99 %] 98 % (06/06 1126) Gen = a x o by 3 in nad HEENT =PERRLA< EOMI< no scleral icterus Pulm= CTAB no w/c/r Cors = nl s1, s2 Chest wall = no pain or swelling or erythema about PPM Abd= soft ntnd, bs+ Ext= warm, no c/c/e Skin =bullae on left foot Lab Results Recent Labs    11/06/21 0125 11/07/21 0319  WBC 17.0* 14.1*  HGB 11.7* 9.8*  HCT 33.5* 30.0*  NA 131* 133*  K 4.5 4.3  CL 104 106  CO2 21* 21*  BUN 75* 64*  CREATININE 1.99* 1.61*   Liver Panel Recent Labs    11/05/21 0513  PROT 5.8*  ALBUMIN 1.9*  AST 70*  ALT 82*  ALKPHOS 336*  BILITOT 0.6   Sedimentation Rate No results for input(s): ESRSEDRATE in the last 72 hours. C-Reactive Protein No results for input(s): CRP in the last 72 hours.  Microbiology: reviewed Studies/Results: No results found.   Assessment/Plan: Klebsiella lumbar discitis/psoas abscess and secondary bacteremia = continue with cefazolin  for the time being. Will repeat blood cx to ensure bacteremia is cleared so that he can get picc line placed for prolonged IV abtx for treatment.  Psoas abscess/discitis = having IR to do drain/aspiration scheduled for 6/8 ( needed to delay due to eliquis)  Bullae to left foot = wound care to address.  Leukocytosis = improving down to 14. Anticipate to trend down with abtx treatment  Elevated alk phos = with slight transaminitis. Consider getting RUQ U/S    [facility] for Infectious Diseases Pager: [phone]  11/07/2021, 12:13 PM

## 2021-11-07 NOTE — Plan of Care (Signed)
  Problem: Coping: Goal: Ability to adjust to condition or change in health will improve Outcome: Progressing   Problem: Fluid Volume: Goal: Ability to maintain a balanced intake and output will improve Outcome: Progressing   Problem: Metabolic: Goal: Ability to maintain appropriate glucose levels will improve Outcome: Progressing   Problem: Nutritional: Goal: Maintenance of adequate nutrition will improve Outcome: Progressing   Problem: Tissue Perfusion: Goal: Adequacy of tissue perfusion will improve Outcome: Progressing   Problem: Education: Goal: Knowledge of General Education information will improve Description: Including pain rating scale, medication(s)/side effects and non-pharmacologic comfort measures Outcome: Progressing   Problem: Clinical Measurements: Goal: Diagnostic test results will improve Outcome: Progressing   Problem: Pain Managment: Goal: General experience of comfort will improve Outcome: Progressing   Problem: Safety: Goal: Ability to remain free from injury will improve Outcome: Progressing

## 2021-11-07 NOTE — Progress Notes (Addendum)
Progress Note   Patient: Glenn [name] [id number] DOB: 1940/06/30 DOA: 10/31/2021     6 DOS: the patient was seen and examined on 11/07/2021 at 11:15AM      Brief hospital course: Glenn [name] is a 81 y.o. M with CAD s/p CABG 84, isch CM and sCHF EF <20%, DM, CKD IIIb baseline 1.7, pAF on Eliquis, hx PPM and BPH who presented with 3 days weakness to [facility].  Evidently malaise and generalized weakness started few days prior to admission, had anorexia and poor PO intake, got weaker and weaker, fell and developed low back pain which became worse and worse, called EMS.  5/31: In the ER, BP 70/40, WBC 24K, RR 25, Na 129, INR 3, Cr 3.62 (baseline 2018 1.1, none since), CT showed bladder distension and ?discitis/osteo of the spine 6/1: Arrived to [facility] on pressors, blood cultures with K pneumoniae in 2/2, UCX multiple (and had urinary retention with 1.7L out), TTE no vegetations 6/2: MRI without drainable focus, pressors titrated off, transferred OOU 6/5: IR consulted for disc aspiration      Assessment and Plan: * Septic shock ([place]) Bacteremia due to Klebsiella pneumoniae Osteomyelitis of lumbar spine (HCC) Discitis 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[place]) Septic shock present on admission due to Klebsiella bacteremia, Klebsiella UTI, as well as left psoas abscess and L4-5 discitis/osteomyelitis.  Sepsis physiology has resolved and the patient will need an extended course of antibiotics for osteomyelitis of the spine.  ID have asked for aspiration and culture of spinal infection. - Consult IR for aspiration, plan for Thursday after aspirin washout - Continue cefazolin - Consult infectious disease      Bladder outlet obstruction Longstanding problem, due to BPH.  Reports to me he has had "multiple prostate procedures in the last few years"  At [place] he is on finasteride and flomax and self-caths every other day.  He is supposed to self-cath every day, but prefers not to.    Unfortunately, not  restarted on flomax until out of the ICU - Daily I/O cath - Stand to urinate - Continue finasteride and Flomax - Outpatient Urology follow up needed asap  Foot ulcer ([place]) - Consult WOC  Paroxysmal atrial fibrillation ([place]) Rate controlled at present.  His coagulopathy is resolved  - Resume Coreg - Hold Eliquis for disc aspiration Friday - Trend INR to normal  Protein-calorie malnutrition, moderate (HCC) As evidenced by diffuse muscle loss and moderate loss of subcutaneous muscle mass and fat, wasting of the thighs, thenar wasting, temporal wasting -Consult dietitian  Coagulopathy [facility]) Patient presented with INR 3, INR now back to 1.4 Likely septic coagulopathy, only on Eliquis at home - Trend INR  Anemia due to chronic kidney disease Hemoglobin appears to be stable around the baseline, no clinical bleeding  Thrombocytopenia (HCC) Platelets down to 80K due to sepsis.  Now resolved to normal  Myocardial injury Minimal troponin elevation, low and flat, consistent with sepsis related myocardial injury without angina or MI.  Transaminitis Due to septic injury, improving.  Hypokalemia Supplemented and resolved  Hyponatremia Stable - Resume diuretics and monitor  Chronic combined systolic and diastolic CHF (congestive heart failure) (HCC) EF less than 20%, tricuspid regurgitation noted, grade 2 diastolic dysfunction.  Clinically appears euvolemic at this time.    -Continue to hold Entresto give soft BP - Continue carvedilol lower dose than home - Resume torsemide  Coronary artery disease involving native coronary artery of native heart without angina pectoris - Continue atorvastatin, BB - Hold Jardiance for now  Chronic kidney disease, stage 3b ([place]) See above  Pacemaker    Uncontrolled type 2 diabetes mellitus with hyperglycemia, with long-term current use of insulin (HCC) A1c >10% Glucose here was low with Levemir BID, now stopped and was up to the 300s   Last 24 hours glucoses suitable, 180s-220s. - Hold Jardiance, 70/30, victoza and metformin - Continue Levemir - Continue SSI   Acute kidney injury ([place]) Baseline creatinine per outside records includes around 1.7.  Creatinine on admission was 3.6, is now improved back down to 1.9 and stable, 1.6 today -Avoid nephrotoxins, hypotension, NSAIDs          Subjective: Patient has no complaints, he is overall tired, he has moderate back pain, treated with oxycodone, no fevers, no confusion, no respiratory symptoms     Physical Exam: Vitals:   11/07/21 0300 11/07/21 0617 11/07/21 0720 11/07/21 1126  BP: 103/60 114/67 109/63 (!) 99/54  Pulse: 87 90 90 83  Resp: 19 (!) 23 (!) 23 18  Temp: 98.5 F (36.9 C)  98.7 F (37.1 C) 97.8 F (36.6 C)  TempSrc: Oral  Oral Oral  SpO2: 90% 97% 99% 98%  Weight:       Thin elderly adult male, sitting on the edge of the bed, working with occupational therapy RRR, I do not appreciate systolic murmurs, he has no lower extremity pitting, JVP normal Respiratory rate normal, crackles at bilateral bases, no wheezing Attention normal, affect appropriate, judgment and insight appear normal, moves all extremities with generalized weakness but symmetric strength, speech fluent, face symmetric     Data Reviewed: Infectious disease notes reviewed, discussed with IR, nursing notes reviewed, vital signs reviewed Patient metabolic panel shows improved creatinine, stable electrolytes White blood cell count improved, hemoglobin stable, platelets improved  Family Communication: We will call daughter later today    Disposition: Status is: Inpatient The patient was admitted with septic shock, found to have UTI, GNR bacteremia, and extension of the infection into his lumbar spine.  He will need an extended course of antibiotics and already has a PICC.  ID have decided that they would like disc aspiration, so this is planned for Thursday.  After this  aspirate culture is finalized and ID has finalized their antibiotic recommendations (agent and duration), he will be stable for discharge to rehab        Author: Edwin Dada, MD 11/07/2021 11:38 AM  For on call review [email or web address].

## 2021-11-07 NOTE — Consult Note (Signed)
WOC Nurse Consult Note: Patient receiving care in [facility] 2C1. Consult completed via ELink camera and information obtained from chart, RN, and patient. Reason for Consult: foot ulcers Wound type: fluid filled intact bulla to left plantar foot at the 5th metatarsal head, that extends to the dorsum of the foot. Pressure Injury POA: Yes/No/NA Measurement: Wound bed: intact blister Drainage (amount, consistency, odor) none Periwound:intact Dressing procedure/placement/frequency: Place Xeroform gauze over the left lateral foot "blister". Secure with kerlix. Change daily.  Monitor the wound area(s) for worsening of condition such as: Signs/symptoms of infection,  Increase in size,  Development of or worsening of odor, Development of pain, or increased pain at the affected locations.  Notify the medical team if any of these develop.  Thank you for the consult.  Discussed plan of care with the patient and bedside nurse.  WOC nurse will not follow at this time.  Please re-consult the WOC team if needed.  Helmut Muster, RN, MSN, CWOCN, CNS-BC, pager [phone]

## 2021-11-07 NOTE — Plan of Care (Signed)
  Problem: Safety: Goal: Ability to remain free from injury will improve Outcome: Progressing   

## 2021-11-07 NOTE — Evaluation (Signed)
Occupational Therapy Evaluation Patient Details Name: Glenn [name] MRN: [id number] DOB: Nov 03, 1940 Today's Date: 11/07/2021   History of Present Illness Patient is a 81 y/o male who presents on 5/31 with generalized weakness and falls. 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 PMH includes DM, HTN, MI, CAD s/p CABG, recent PPM placement and DCCV for Afib, CHF, CKD, BPH.   Clinical Impression   Pt admitted with the above diagnoses and presents with below problem list. Pt will benefit from continued acute OT to address the below listed deficits and maximize independence with basic ADLs prior to d/c to venue below. Pt reports that at baseline he used no AD for mobility, limited to household distances, utilizes shower seat for bathing tasks. Mod I with basic ADLs. Pt lives with his spouse who is disabled, reports assist with IADLs including transportation from daughter. Pt presents with decreased activity tolerance, generalized weakness, and impaired balance impacting ADLs. Pt currently needs min A with functional transfers, SPT to recliner. Pt needs up to mod A with LB ADLs in sit<>stand. Posterior lean. Pt reports he very much values his independence and does not like having to rely on others. Discussed AIR rehab recommendation and recommended pt discuss with trusted family member(s).        Recommendations for follow up therapy are one component of a multi-disciplinary discharge planning process, led by the attending physician.  Recommendations may be updated based on patient status, additional functional criteria and insurance authorization.   Follow Up Recommendations  Acute inpatient rehab (3hours/day)    Assistance Recommended at Discharge Intermittent Supervision/Assistance  Patient can return home with the following A little help with walking and/or transfers;A little help with  bathing/dressing/bathroom;Assistance with cooking/housework;Direct supervision/assist for medications management;Direct supervision/assist for financial management;Assist for transportation    Functional Status Assessment  Patient has had a recent decline in their functional status and demonstrates the ability to make significant improvements in function in a reasonable and predictable amount of time.  Equipment Recommendations  Other (comment) (defer to next venue)    Recommendations for Other Services       Precautions / Restrictions Precautions Precautions: Fall Restrictions Weight Bearing Restrictions: No      Mobility Bed Mobility Overal bed mobility: Needs Assistance Bed Mobility: Rolling, Sidelying to Sit Rolling: Min guard Sidelying to sit: Min guard       General bed mobility comments: utilized bed rail and whole bed momentum. min guard for safety    Transfers Overall transfer level: Needs assistance Equipment used: Rolling walker (2 wheels) Transfers: Sit to/from Stand, Bed to chair/wheelchair/BSC Sit to Stand: Min assist, From elevated surface Stand pivot transfers: Min assist, From elevated surface         General transfer comment: from EOB at height to simulate bed at home. to recliner. assist needed to stabilize rw throughout and light steadying assist for pt. Pt values independence "let me do it." Uncontrolled descent into recliner.      Balance Overall balance assessment: Needs assistance Sitting-balance support: No upper extremity supported, Feet supported, Bilateral upper extremity supported Sitting balance-Leahy Scale: Fair Sitting balance - Comments: tends to sit with BUE support Postural control: Posterior lean Standing balance support: Reliant on assistive device for balance, Bilateral upper extremity supported Standing balance-Leahy Scale: Poor Standing balance comment: rw and min steadying assist (rw>pt) in static standing. posterior lean.  ADL either performed or assessed with clinical judgement   ADL Overall ADL's : Needs assistance/impaired Eating/Feeding: Set up;Sitting   Grooming: Set up;Sitting   Upper Body Bathing: Minimal assistance;Sitting;Set up;Min guard   Lower Body Bathing: Minimal assistance;Moderate assistance;Sit to/from stand Lower Body Bathing Details (indicate cue type and reason): assist for balance and task managment Upper Body Dressing : Set up;Minimal assistance;Sitting   Lower Body Dressing: Minimal assistance;Moderate assistance;Sit to/from stand Lower Body Dressing Details (indicate cue type and reason): assist for balance and task managment Toilet Transfer: Minimal assistance;Stand-pivot;Rolling walker (2 wheels)   Toileting- Clothing Manipulation and Hygiene: Minimal assistance;Moderate assistance;Sit to/from stand         General ADL Comments: needs supported sitting for dynamic tasks. Unsteadiness noted in standing, posterior lean, requires assist to stabilize rw in standing. Limited to pivotal steps to recliner, uncontrolled descent to chair.     Vision         Perception     Praxis      Pertinent Vitals/Pain Pain Assessment Pain Assessment: No/denies pain     Hand Dominance     Extremity/Trunk Assessment Upper Extremity Assessment Upper Extremity Assessment: Generalized weakness   Lower Extremity Assessment Lower Extremity Assessment: Defer to PT evaluation   Cervical / Trunk Assessment Cervical / Trunk Assessment: Other exceptions Cervical / Trunk Exceptions: forward head, rounded shoulders   Communication Communication Communication: No difficulties   Cognition Arousal/Alertness: Awake/alert Behavior During Therapy: Flat affect Overall Cognitive Status: No family/caregiver present to determine baseline cognitive functioning                                 General Comments: seems to be a disconnect between his  verbal report of how he moves and what how he actually moves. Values independence "let me do it" Pt agreeable to OT stabilizing rw in stnading as pt with poor standing balance and tendency toward posterior lean     General Comments       Exercises Exercises: Other exercises Other Exercises Other Exercises: provided incentive spirometer and instructed in use. pt gave return demos.   Shoulder Instructions      Home Living Family/patient expects to be discharged to:: Private residence Living Arrangements: Children;Spouse/significant other (wife is disabled, unable to help him at home. daughter is a Pharmacist, hospital and able to help in the evenings and on weekends) Available Help at Discharge: Family Type of Home: House Home Access: Ramped entrance     Home Layout: One level               Home Equipment: Conservation officer, nature (2 wheels);Wheelchair - manual;Cane - single point          Prior Functioning/Environment Prior Level of Function : Needs assist             Mobility Comments: reports uses no AD for mobility at baseline. limited to household distances. ADLs Comments: daughters helps cook and clean and transport to appointments. Pt is adamant he needs no help with basic ADLs "(normally)I can do everything you can do."        OT Problem List: Decreased strength;Decreased range of motion;Decreased activity tolerance;Impaired balance (sitting and/or standing);Decreased cognition;Decreased safety awareness;Decreased knowledge of use of DME or AE;Decreased knowledge of precautions;Pain      OT Treatment/Interventions: Therapeutic exercise;Self-care/ADL training;Energy conservation;DME and/or AE instruction;Therapeutic activities;Patient/family education;Balance training    OT Goals(Current goals can be found in the care plan section)  Acute Rehab OT Goals Patient Stated Goal: values independence OT Goal Formulation: With patient Time For Goal Achievement: 11/21/21 Potential to  Achieve Goals: Good ADL Goals Pt Will Perform Lower Body Bathing: with modified independence;sit to/from stand Pt Will Perform Lower Body Dressing: with modified independence;sit to/from stand Pt Will Transfer to Toilet: with modified independence;ambulating Pt Will Perform Toileting - Clothing Manipulation and hygiene: with modified independence;sit to/from stand Pt Will Perform Tub/Shower Transfer: with supervision;ambulating;shower seat;rolling walker  OT Frequency: Min 2X/week    Co-evaluation              AM-PAC OT "6 Clicks" Daily Activity     Outcome Measure Help from another person eating meals?: A Little Help from another person taking care of personal grooming?: A Little Help from another person toileting, which includes using toliet, bedpan, or urinal?: A Little Help from another person bathing (including washing, rinsing, drying)?: A Little Help from another person to put on and taking off regular upper body clothing?: A Little Help from another person to put on and taking off regular lower body clothing?: A Little 6 Click Score: 18   End of Session Equipment Utilized During Treatment: Rolling walker (2 wheels) Nurse Communication: Mobility status;Precautions;Other (comment) (spoke with NT)  Activity Tolerance: Patient limited by fatigue Patient left: in chair;with call bell/phone within reach;with chair alarm set;Other (comment) (Prevalon boots on)  OT Visit Diagnosis: Unsteadiness on feet (R26.81);Muscle weakness (generalized) (M62.81);Pain;Other symptoms and signs involving cognitive function                Time: 1100-1127 OT Time Calculation (min): 27 min Charges:  OT General Charges $OT Visit: 1 Visit OT Evaluation $OT Eval Moderate Complexity: 1 Mod OT Treatments $Self Care/Home Management : 8-22 mins  Tyrone Schimke, OT Acute Rehabilitation Services Office: [phone]   Hortencia Pilar 11/07/2021, 11:50 AM

## 2021-11-08 DIAGNOSIS — R6521 Severe sepsis with septic shock: Secondary | ICD-10-CM | POA: Diagnosis not present

## 2021-11-08 DIAGNOSIS — A419 Sepsis, unspecified organism: Secondary | ICD-10-CM | POA: Diagnosis not present

## 2021-11-08 LAB — COMPREHENSIVE METABOLIC PANEL
ALT: 6 U/L (ref 0–44)
AST: 23 U/L (ref 15–41)
Albumin: 1.8 g/dL — ABNORMAL LOW (ref 3.5–5.0)
Alkaline Phosphatase: 226 U/L — ABNORMAL HIGH (ref 38–126)
Anion gap: 10 (ref 5–15)
BUN: 62 mg/dL — ABNORMAL HIGH (ref 8–23)
CO2: 22 mmol/L (ref 22–32)
Calcium: 8.4 mg/dL — ABNORMAL LOW (ref 8.9–10.3)
Chloride: 101 mmol/L (ref 98–111)
Creatinine, Ser: 1.7 mg/dL — ABNORMAL HIGH (ref 0.61–1.24)
GFR, Estimated: 41 mL/min — ABNORMAL LOW (ref >60)
Glucose, Bld: 198 mg/dL — ABNORMAL HIGH (ref 70–99)
Potassium: 4.1 mmol/L (ref 3.5–5.1)
Sodium: 133 mmol/L — ABNORMAL LOW (ref 135–145)
Total Bilirubin: 0.9 mg/dL (ref 0.3–1.2)
Total Protein: 5.9 g/dL — ABNORMAL LOW (ref 6.5–8.1)

## 2021-11-08 LAB — CBC
HCT: 31 % — ABNORMAL LOW (ref 39.0–52.0)
Hemoglobin: 10.5 g/dL — ABNORMAL LOW (ref 13.0–17.0)
MCH: 27.5 pg (ref 26.0–34.0)
MCHC: 33.9 g/dL (ref 30.0–36.0)
MCV: 81.2 fL (ref 80.0–100.0)
Platelets: 194 10*3/uL (ref 150–400)
RBC: 3.82 MIL/uL — ABNORMAL LOW (ref 4.22–5.81)
RDW: 16.9 % — ABNORMAL HIGH (ref 11.5–15.5)
WBC: 14.3 10*3/uL — ABNORMAL HIGH (ref 4.0–10.5)
nRBC: 0 % (ref 0.0–0.2)

## 2021-11-08 LAB — GLUCOSE, CAPILLARY
Glucose-Capillary: 238 mg/dL — ABNORMAL HIGH (ref 70–99)
Glucose-Capillary: 471 mg/dL — ABNORMAL HIGH (ref 70–99)
Glucose-Capillary: 300 mg/dL — ABNORMAL HIGH (ref 70–99)
Glucose-Capillary: 274 mg/dL — ABNORMAL HIGH (ref 70–99)

## 2021-11-08 LAB — PROTIME-INR
INR: 1.3 — ABNORMAL HIGH (ref 0.8–1.2)
Prothrombin Time: 16.4 seconds — ABNORMAL HIGH (ref 11.4–15.2)

## 2021-11-08 MED ORDER — INSULIN DETEMIR 100 UNIT/ML ~~LOC~~ SOLN
10.0000 [IU] | Freq: Every day | SUBCUTANEOUS | Status: DC
  Administered 2021-11-09 – 2021-11-11 (×3): 10 [IU] via SUBCUTANEOUS
  Filled 2021-11-08 (×4): qty 0.1

## 2021-11-08 NOTE — TOC Progression Note (Addendum)
Transition of Care [facility]) - Progression Note    Patient Details  Name: Glenn [name] MRN: [id number] Date of Birth: [birth year]  Transition of [facility]) CM/SW Contact  Tom-Johnson, Hershal Coria, RN Phone Number: 11/08/2021, 4:27 PM  Clinical Narrative:     Patient found to have Osteomyelitis of the lumbar spine, Discitis and Psoas abscess: Disc aspiration scheduled for tomorrow 06/08. On IV abx, ID following.  Patient was screened AIR Awaiting their response. CM will continue to follow with needs.   Expected Discharge Plan: Home/Self Care Barriers to Discharge: Continued Medical Work up  Expected Discharge Plan and Services Expected Discharge Plan: Home/Self Care In-house Referral: NA Discharge Planning Services: CM Consult Post Acute Care Choice: NA Living arrangements for the past 2 months: Single Family Home                 DME Arranged: N/A DME Agency: NA       HH Arranged: NA HH Agency: NA         Social Determinants of Health (SDOH) Interventions    Readmission Risk Interventions    11/06/2021   12:28 PM  Readmission Risk Prevention Plan  Transportation Screening Complete  PCP or Specialist Appt within 3-5 Days Complete  HRI or Home Care Consult Complete  Social Work Consult for Recovery Care Planning/Counseling Complete  Palliative Care Screening Not Applicable  Medication Review Oceanographer) Referral to Pharmacy

## 2021-11-08 NOTE — Plan of Care (Signed)
  Problem: Safety: Goal: Ability to remain free from injury will improve Outcome: Progressing   

## 2021-11-08 NOTE — Progress Notes (Signed)
Inpatient Diabetes Program Recommendations  AACE/ADA: New Consensus Statement on Inpatient Glycemic Control (2015)  Target Ranges:  Prepandial:   less than 140 mg/dL      Peak postprandial:   less than 180 mg/dL (1-2 hours)      Critically ill patients:  140 - 180 mg/dL   Lab Results  Component Value Date   GLUCAP 238 (H) 11/08/2021   HGBA1C 10.6 (H) 11/02/2021    Review of Glycemic Control  Latest Reference Range & Units 11/07/21 05:59 11/07/21 11:24 11/07/21 16:38 11/07/21 20:32 11/08/21 07:17  Glucose-Capillary 70 - 99 mg/dL 829 (H) 562 (H) 130 (H) 202 (H) 238 (H)   Diabetes history: DM 2 Outpatient Diabetes medications: Victoza 1.8 mg QD, Novolog 70/30 10-25 units QD, metformin 500 mg BID Current orders for Inpatient glycemic control:  Levemir 5 units Daily Novolog 0-15 units tid + hs   Inpatient Diabetes Program Recommendations:    -  Increase Levemir to 10 units  Thanks,  Christena Deem RN, MSN, BC-ADM Inpatient Diabetes Coordinator Team Pager [phone] (8a-5p)

## 2021-11-08 NOTE — Progress Notes (Signed)
PROGRESS NOTE    Glenn [name]  [id number] DOB: 11/28/1940 DOA: 10/18/2021 PCP: System, Provider Not In    Brief Narrative:  Glenn [name] is a 81 y.o. 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   Evidently malaise and generalized weakness started few days prior to admission, had anorexia and poor PO intake, got weaker and weaker, fell and developed low back pain which became worse and worse, called EMS.   5/31: In the ER, BP 70/40, WBC 24K, RR 25, Na 129, INR 3, Cr 3.62 (baseline 2018 1.1, none since), CT showed bladder distension and ?discitis/osteo of the spine 6/1: Arrived to [facility] on pressors, blood cultures with K pneumoniae in 2/2, UCX multiple (and had urinary retention with 1.7L out), TTE no vegetations 6/2: MRI without drainable focus, pressors titrated off, transferred out of ICU. 6/5: IR consulted for disc aspiration    Assessment & Plan:   Septic shock secondary to bacteremia due to Klebsiella pneumonia Osteomyelitis of the lumbar spine.  Discitis and psoas abscess: Sepsis physiology is improving.  Currently remains on cefazolin.  Followed by infectious disease. IR consulted for disc aspiration, scheduled for tomorrow. ID to decide about PICC line and long-term antibiotics.  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  Self cath at home.  Currently needing in and out cath multiple times a day.  Will insert Foley catheter today until he has improved mobility.  Paroxysmal A-fib: Rate controlled.  On Coreg.  Eliquis on hold for procedure.  Chronic combined heart failure: Euvolemic.  Entresto on hold.  Coreg and torsemide resumed.  Type 2 diabetes uncontrolled with hyperglycemia: Hemoglobin A1c more than 10.  Blood sugars elevated now.  Increase dose of Levemir to 10 units today.  AKI on CKD stage IIIb: Creatinine improving back to his levels of 1.6-1.7.   Avoid nephrotoxins.  Moderate protein calorie malnutrition: Nutrition Status: Nutrition Problem: Moderate Malnutrition Etiology: chronic illness (CHF) Signs/Symptoms: moderate fat depletion, severe muscle depletion Interventions: MVI, Liberalize Diet, Other (Comment) (double protein portions)   Foot ulcer: Pressure Injury 11/02/21 Foot Left;Lateral Deep Tissue Pressure Injury - Purple or maroon localized area of discolored intact skin or blood-filled blister due to damage of underlying soft tissue from pressure and/or shear. 5.5x4 (Active)  11/02/21   Location: Foot  Location Orientation: Left;Lateral  Staging: Deep Tissue Pressure Injury - Purple or maroon localized area of discolored intact skin or blood-filled blister due to damage of underlying soft tissue from pressure and/or shear.  Wound Description (Comments): 5.5x4  Present on Admission: Yes       DVT prophylaxis: Place and maintain sequential compression device Start: 11/02/21 1108   Code Status: Full code Family Communication: None Disposition Plan: Status is: Inpatient Remains inpatient appropriate because: Inpatient procedure planned.     Consultants:  Infectious disease Critical care Interventional radiology  Procedures:  None  Antimicrobials:  Cefazolin   Subjective: Patient seen and examined.  3 in and out cath overnight.  Patient complains of severe lower back pain on any mobility.  Afebrile.  Looking forward for rehab.  Objective: Vitals:   11/07/21 2121 11/08/21 0059 11/08/21 0431 11/08/21 0934  BP: 112/67 122/69 121/62 (!) 99/51  Pulse: 85 92 93 95  Resp: 18 18 18 17   Temp: 97.7 F (36.5 C) 97.6 F (36.4 C) 98 F (36.7 C) 98.2 F (36.8 C)  TempSrc: Oral Oral Oral   SpO2: 99% 98% 98% 96%  Weight:      Height:        Intake/Output Summary (Last 24 hours) at 11/08/2021 1238 Last data filed at 11/08/2021 0800 Gross per 24 hour  Intake 1120 ml  Output 2225 ml  Net -1105 ml   Filed  Weights   11/03/21 0600 11/04/21 0630 11/07/21 1500  Weight: 63.2 kg 64.2 kg 64.9 kg    Examination:  General exam: Appears calm and comfortable  Mildly anxious.  Alert oriented x4.  Not in any distress at rest but in mild distress on mobility. Respiratory system: Clear to auscultation. Respiratory effort normal. Cardiovascular system: S1 & S2 heard, RRR. No JVD, murmurs, rubs, gallops or clicks. No pedal edema. Gastrointestinal system: Abdomen is nondistended, soft and nontender. No organomegaly or masses felt. Normal bowel sounds heard. Central nervous system: Alert and oriented. No focal neurological deficits. Extremities: Symmetric 5 x 5 power.     Data Reviewed: I have personally reviewed following labs and imaging studies  CBC: Recent Labs  Lab 11/04/21 0516 11/05/21 0513 11/06/21 0125 11/07/21 0319 11/08/21 0453  WBC 15.9* 13.9* 17.0* 14.1* 14.3*  HGB 11.8* 11.8* 11.7* 9.8* 10.5*  HCT 33.7* 34.0* 33.5* 30.0* 31.0*  MCV 78.6* 80.4 80.1 82.4 81.2  PLT 82* 83* 110* 146* [id number]   Basic Metabolic Panel: Recent Labs  Lab 11/02/21 0119 11/02/21 1715 11/03/21 0330 11/03/21 1036 11/04/21 0516 11/05/21 0513 11/06/21 0125 11/07/21 0319 11/08/21 0453  NA 130*   < > 133*  --  133* 130* 131* 133* 133*  K 4.0   < > 3.0*   < > 3.6 4.4 4.5 4.3 4.1  CL 97*   < > 103  --  103 101 104 106 101  CO2 19*   < > 21*  --  22 19* 21* 21* 22  GLUCOSE 344*   < > 75  --  56* 270* 213* 199* 198*  BUN 114*   < > 98*  --  80* 75* 75* 64* 62*  CREATININE 3.10*   < > 2.26*  --  1.80* 1.90* 1.99* 1.61* 1.70*  CALCIUM 8.3*   < > 8.3*  --  8.6* 8.5* 8.7* 8.6* 8.4*  MG 2.1  --  2.0  --   --   --   --   --   --   PHOS  --   --  2.7  --   --   --   --   --   --    < > = values in this interval not displayed.   GFR: Estimated Creatinine Clearance: 32.9 mL/min (A) (by C-G formula based on SCr of 1.7 mg/dL (H)). Liver Function Tests: Recent Labs  Lab 11/02/21 0119 11/03/21 0330 11/04/21 0516  11/05/21 0513 11/08/21 0453  AST 106* 203* 121* 70* 23  ALT 80* 146* 116* 82* 6  ALKPHOS 216* 306* 281* 336* 226*  BILITOT 1.0 0.9 0.7 0.6 0.9  PROT 6.1* 5.7* 5.7* 5.8* 5.9*  ALBUMIN 2.5* 2.1* 1.9* 1.9* 1.8*   No results for input(s): LIPASE, AMYLASE in the last 168 hours. No results for input(s): AMMONIA in the last 168 hours. Coagulation Profile: Recent Labs  Lab 11/02/21 0119 11/03/21 0330 11/05/21 1000 11/06/21 0125 11/08/21 0453  INR 2.6* 1.7* 1.4* 1.6* 1.3*   Cardiac Enzymes: Recent Labs  Lab 11/03/21 1036  CKTOTAL 81   BNP (last 3 results) No results for input(s): PROBNP  in the last 8760 hours. HbA1C: No results for input(s): HGBA1C in the last 72 hours. CBG: Recent Labs  Lab 11/07/21 1124 11/07/21 1638 11/07/21 2032 11/08/21 0717 11/08/21 1125  GLUCAP 254* 186* 202* 238* 471*   Lipid Profile: No results for input(s): CHOL, HDL, LDLCALC, TRIG, CHOLHDL, LDLDIRECT in the last 72 hours. Thyroid Function Tests: No results for input(s): TSH, T4TOTAL, FREET4, T3FREE, THYROIDAB in the last 72 hours. Anemia Panel: No results for input(s): VITAMINB12, FOLATE, FERRITIN, TIBC, IRON, RETICCTPCT in the last 72 hours. Sepsis Labs: Recent Labs  Lab 10/09/2021 1817 11/02/21 0119  LATICACIDVEN 1.7 1.7    Recent Results (from the past 240 hour(s))  Resp Panel by RT-PCR (Flu A&B, Covid) Urine, Clean Catch     Status: None   Collection Time: 10/17/2021  5:07 PM   Specimen: Urine, Clean Catch; Nasal Swab  Result Value Ref Range Status   SARS Coronavirus 2 by RT PCR NEGATIVE NEGATIVE Final    Comment: (NOTE) SARS-CoV-2 target nucleic acids are NOT DETECTED.  The SARS-CoV-2 RNA is generally detectable in upper respiratory specimens during the acute phase of infection. The lowest concentration of SARS-CoV-2 viral copies this assay can detect is 138 copies/mL. A negative result does not preclude SARS-Cov-2 infection and should not be used as the sole basis for treatment  or other patient management decisions. A negative result may occur with  improper specimen collection/handling, submission of specimen other than nasopharyngeal swab, presence of viral mutation(s) within the areas targeted by this assay, and inadequate number of viral copies(<138 copies/mL). A negative result must be combined with clinical observations, patient history, and epidemiological information. The expected result is Negative.  Fact Sheet for Patients:  EntrepreneurPulse.com.au  Fact Sheet for Healthcare Providers:  IncredibleEmployment.be  This test is no t yet approved or cleared by the [place] FDA and  has been authorized for detection and/or diagnosis of SARS-CoV-2 by FDA under an Emergency Use Authorization (EUA). This EUA will remain  in effect (meaning this test can be used) for the duration of the COVID-19 declaration under Section 564(b)(1) of the Act, 21 U.S.C.section 360bbb-3(b)(1), unless the authorization is terminated  or revoked sooner.       Influenza A by PCR NEGATIVE NEGATIVE Final   Influenza B by PCR NEGATIVE NEGATIVE Final    Comment: (NOTE) The Xpert Xpress SARS-CoV-2/FLU/RSV plus assay is intended as an aid in the diagnosis of influenza from Nasopharyngeal swab specimens and should not be used as a sole basis for treatment. Nasal washings and aspirates are unacceptable for Xpert Xpress SARS-CoV-2/FLU/RSV testing.  Fact Sheet for Patients: EntrepreneurPulse.com.au  Fact Sheet for Healthcare Providers: IncredibleEmployment.be  This test is not yet approved or cleared by the [place] FDA and has been authorized for detection and/or diagnosis of SARS-CoV-2 by FDA under an Emergency Use Authorization (EUA). This EUA will remain in effect (meaning this test can be used) for the duration of the COVID-19 declaration under Section 564(b)(1) of the Act, 21 U.S.C. section  360bbb-3(b)(1), unless the authorization is terminated or revoked.  Performed at [facility], [address] [place], [place] [address]   Blood Culture (routine x 2)     Status: Abnormal   Collection Time: 10/30/2021  5:07 PM   Specimen: BLOOD  Result Value Ref Range Status   Specimen Description   Final    BLOOD BLOOD LEFT HAND Performed at [facility], [address], [place], [place] [address]    Special Requests  Final    BOTTLES DRAWN AEROBIC AND ANAEROBIC Blood Culture adequate volume Performed at [facility], [address] [place], [place] [address]    Culture  Setup Time   Final    IN BOTH AEROBIC AND ANAEROBIC BOTTLES GRAM NEGATIVE RODS CRITICAL VALUE NOTED.  VALUE IS CONSISTENT WITH PREVIOUSLY REPORTED AND CALLED VALUE. Performed at [facility], [address] [place], [place] [address]    Culture (A)  Final    KLEBSIELLA PNEUMONIAE SUSCEPTIBILITIES PERFORMED ON PREVIOUS CULTURE WITHIN THE LAST 5 DAYS. Performed at [facility], [place] [address], [place], [place] [address]    Report Status 11/04/2021 FINAL  Final  Blood Culture (routine x 2)     Status: Abnormal   Collection Time: 10/28/2021  5:07 PM   Specimen: BLOOD  Result Value Ref Range Status   Specimen Description   Final    BLOOD BLOOD LEFT FOREARM Performed at [facility], [address], [place], [place] [address]    Special Requests   Final    BOTTLES DRAWN AEROBIC AND ANAEROBIC Blood Culture results may not be optimal due to an inadequate volume of blood received in culture bottles Performed at [facility], [address] [place], [place] [address]    Culture  Setup Time   Final    Organism ID to follow GRAM NEGATIVE RODS IN BOTH AEROBIC AND ANAEROBIC BOTTLES CRITICAL RESULT CALLED TO, READ BACK BY AND VERIFIED WITH: GREG ABBOTT @ [id number] ON 11/02/2021.Marland KitchenMarland KitchenTKR Performed at [facility], [address] [place], [place]  [address]    Culture KLEBSIELLA PNEUMONIAE (A)  Final   Report Status 11/04/2021 FINAL  Final   Organism ID, Bacteria KLEBSIELLA PNEUMONIAE  Final      Susceptibility   Klebsiella pneumoniae - MIC*    AMPICILLIN RESISTANT Resistant     CEFAZOLIN <=4 SENSITIVE Sensitive     CEFEPIME <=0.12 SENSITIVE Sensitive     CEFTAZIDIME <=1 SENSITIVE Sensitive     CEFTRIAXONE <=0.25 SENSITIVE Sensitive     CIPROFLOXACIN <=0.25 SENSITIVE Sensitive     GENTAMICIN <=1 SENSITIVE Sensitive     IMIPENEM <=0.25 SENSITIVE Sensitive     TRIMETH/SULFA <=20 SENSITIVE Sensitive     AMPICILLIN/SULBACTAM 4 SENSITIVE Sensitive     PIP/TAZO 8 SENSITIVE Sensitive     * KLEBSIELLA PNEUMONIAE  Urine Culture     Status: Abnormal   Collection Time: 10/14/2021  5:07 PM   Specimen: In/Out Cath Urine  Result Value Ref Range Status   Specimen Description   Final    IN/OUT CATH URINE Performed at [facility], [address] [place], [place] [address]    Special Requests   Final    NONE Performed at [facility], [address] [place], [place] [address]    Culture MULTIPLE SPECIES PRESENT, SUGGEST RECOLLECTION (A)  Final   Report Status 11/02/2021 FINAL  Final  Blood Culture ID Panel (Reflexed)     Status: Abnormal   Collection Time: 10/16/2021  5:07 PM  Result Value Ref Range Status   Enterococcus faecalis NOT DETECTED NOT DETECTED Final   Enterococcus Faecium NOT DETECTED NOT DETECTED Final   Listeria monocytogenes NOT DETECTED NOT DETECTED Final   Staphylococcus species NOT DETECTED NOT DETECTED Final   Staphylococcus aureus (BCID) NOT DETECTED NOT DETECTED Final   Staphylococcus epidermidis NOT DETECTED NOT DETECTED Final   Staphylococcus lugdunensis NOT DETECTED NOT DETECTED Final   Streptococcus species NOT DETECTED NOT DETECTED  Final   Streptococcus agalactiae NOT DETECTED NOT DETECTED Final   Streptococcus pneumoniae NOT DETECTED NOT DETECTED Final   Streptococcus pyogenes NOT DETECTED  NOT DETECTED Final   A.calcoaceticus-baumannii NOT DETECTED NOT DETECTED Final   Bacteroides fragilis NOT DETECTED NOT DETECTED Final   Enterobacterales DETECTED (A) NOT DETECTED Final    Comment: Enterobacterales represent a large order of gram negative bacteria, not a single organism. CRITICAL RESULT CALLED TO, READ BACK BY AND VERIFIED WITH: GREG ABBOTT @ 0359 ON 11/02/2021.Marland KitchenMarland KitchenTKR    Enterobacter cloacae complex NOT DETECTED NOT DETECTED Final   Escherichia coli NOT DETECTED NOT DETECTED Final   Klebsiella aerogenes NOT DETECTED NOT DETECTED Final   Klebsiella oxytoca NOT DETECTED NOT DETECTED Final   Klebsiella pneumoniae DETECTED (A) NOT DETECTED Final    Comment: CRITICAL RESULT CALLED TO, READ BACK BY AND VERIFIED WITH: GREG ABBOTT @ 0359 ON 11/02/2021.Marland KitchenMarland KitchenTKR    Proteus species NOT DETECTED NOT DETECTED Final   Salmonella species NOT DETECTED NOT DETECTED Final   Serratia marcescens NOT DETECTED NOT DETECTED Final   Haemophilus influenzae NOT DETECTED NOT DETECTED Final   Neisseria meningitidis NOT DETECTED NOT DETECTED Final   Pseudomonas aeruginosa NOT DETECTED NOT DETECTED Final   Stenotrophomonas maltophilia NOT DETECTED NOT DETECTED Final   Candida albicans NOT DETECTED NOT DETECTED Final   Candida auris NOT DETECTED NOT DETECTED Final   Candida glabrata NOT DETECTED NOT DETECTED Final   Candida krusei NOT DETECTED NOT DETECTED Final   Candida parapsilosis NOT DETECTED NOT DETECTED Final   Candida tropicalis NOT DETECTED NOT DETECTED Final   Cryptococcus neoformans/gattii NOT DETECTED NOT DETECTED Final   CTX-M ESBL NOT DETECTED NOT DETECTED Final   Carbapenem resistance IMP NOT DETECTED NOT DETECTED Final   Carbapenem resistance KPC NOT DETECTED NOT DETECTED Final   Carbapenem resistance NDM NOT DETECTED NOT DETECTED Final   Carbapenem resist OXA 48 LIKE NOT DETECTED NOT DETECTED Final   Carbapenem resistance VIM NOT DETECTED NOT DETECTED Final    Comment: Performed at  [facility], [address] [place], [place] [address]  MRSA Next Gen by PCR, Nasal     Status: None   Collection Time: 10/19/2021 11:47 PM   Specimen: Nasal Mucosa; Nasal Swab  Result Value Ref Range Status   MRSA by PCR Next Gen NOT DETECTED NOT DETECTED Final    Comment: (NOTE) The GeneXpert MRSA Assay (FDA approved for NASAL specimens only), is one component of a comprehensive MRSA colonization surveillance program. It is not intended to diagnose MRSA infection nor to guide or monitor treatment for MRSA infections. Test performance is not FDA approved in patients less than 54 years old. Performed at [facility], [address] [place], [place] [address]   Culture, blood (Routine X 2) w Reflex to ID Panel     Status: None (Preliminary result)   Collection Time: 11/07/21 12:48 PM   Specimen: BLOOD  Result Value Ref Range Status   Specimen Description BLOOD BLOOD LEFT ARM  Final   Special Requests   Final    BOTTLES DRAWN AEROBIC AND ANAEROBIC Blood Culture adequate volume   Culture   Final    NO GROWTH < 24 HOURS Performed at [facility], [address] [place], [place] [address]    Report Status PENDING  Incomplete  Culture, blood (Routine X 2) w Reflex to ID Panel     Status: None (Preliminary result)   Collection Time: 11/07/21  1:00 PM  Specimen: BLOOD  Result Value Ref Range Status   Specimen Description BLOOD BLOOD LEFT HAND  Final   Special Requests   Final    BOTTLES DRAWN AEROBIC AND ANAEROBIC Blood Culture adequate volume   Culture   Final    NO GROWTH < 24 HOURS Performed at [facility], [address], [place], [place] [address]    Report Status PENDING  Incomplete         Radiology Studies: No results found.      Scheduled Meds:  artificial tears   Both Eyes QHS   atorvastatin  80 mg Oral QHS   carvedilol  6.25 mg Oral BID WC   chlorhexidine  15 mL Mouth Rinse BID   Chlorhexidine Gluconate Cloth  6 each Topical  Q0600   finasteride  5 mg Oral Daily   insulin aspart  0-15 Units Subcutaneous TID WC   insulin aspart  0-5 Units Subcutaneous QHS   [START ON 11/09/2021] insulin detemir  10 Units Subcutaneous Daily   ketotifen  1 drop Both Eyes BID   latanoprost  1 drop Both Eyes QHS   mouth rinse  15 mL Mouth Rinse q12n4p   multivitamin with minerals  1 tablet Oral Daily   pantoprazole  40 mg Oral Q1200   sodium chloride flush  10-40 mL Intracatheter Q12H   sodium chloride flush  10-40 mL Intracatheter Q12H   tamsulosin  0.8 mg Oral QHS   torsemide  20 mg Oral Daily   Continuous Infusions:  sodium chloride 250 mL (11/02/21 0205)   sodium chloride Stopped (11/04/21 0512)    ceFAZolin (ANCEF) IV 2 g (11/08/21 0512)     LOS: 7 days    Time spent: 35 minutes    Barb Merino, MD [facility] Pager [phone]

## 2021-11-09 ENCOUNTER — Inpatient Hospital Stay (HOSPITAL_COMMUNITY): Payer: Medicare Other

## 2021-11-09 DIAGNOSIS — R6521 Severe sepsis with septic shock: Secondary | ICD-10-CM | POA: Diagnosis not present

## 2021-11-09 DIAGNOSIS — A419 Sepsis, unspecified organism: Secondary | ICD-10-CM | POA: Diagnosis not present

## 2021-11-09 DIAGNOSIS — M4646 Discitis, unspecified, lumbar region: Secondary | ICD-10-CM | POA: Diagnosis not present

## 2021-11-09 DIAGNOSIS — R7881 Bacteremia: Secondary | ICD-10-CM | POA: Diagnosis not present

## 2021-11-09 HISTORY — PX: IR LUMBAR DISC ASPIRATION W/IMG GUIDE: IMG5306

## 2021-11-09 LAB — GLUCOSE, CAPILLARY
Glucose-Capillary: 297 mg/dL — ABNORMAL HIGH (ref 70–99)
Glucose-Capillary: 210 mg/dL — ABNORMAL HIGH (ref 70–99)
Glucose-Capillary: 88 mg/dL (ref 70–99)
Glucose-Capillary: 124 mg/dL — ABNORMAL HIGH (ref 70–99)

## 2021-11-09 MED ORDER — HYDROCODONE-ACETAMINOPHEN 5-325 MG PO TABS
1.0000 | ORAL_TABLET | ORAL | Status: DC | PRN
  Administered 2021-11-09 – 2021-11-10 (×2): 1 via ORAL
  Administered 2021-11-11: 2 via ORAL
  Administered 2021-11-11 – 2021-11-12 (×2): 1 via ORAL
  Filled 2021-11-09: qty 2
  Filled 2021-11-09 (×4): qty 1

## 2021-11-09 MED ORDER — FENTANYL CITRATE (PF) 100 MCG/2ML IJ SOLN
INTRAMUSCULAR | Status: AC | PRN
  Administered 2021-11-09 (×2): 25 ug via INTRAVENOUS

## 2021-11-09 MED ORDER — MIDAZOLAM HCL 2 MG/2ML IJ SOLN
INTRAMUSCULAR | Status: AC
  Filled 2021-11-09: qty 2

## 2021-11-09 MED ORDER — MIDAZOLAM HCL 2 MG/2ML IJ SOLN
INTRAMUSCULAR | Status: AC | PRN
  Administered 2021-11-09: 1 mg via INTRAVENOUS
  Administered 2021-11-09: .5 mg via INTRAVENOUS

## 2021-11-09 MED ORDER — LIDOCAINE HCL (PF) 1 % IJ SOLN
INTRAMUSCULAR | Status: AC
  Administered 2021-11-09: 10 mL
  Filled 2021-11-09: qty 30

## 2021-11-09 MED ORDER — LIDOCAINE HCL (PF) 1 % IJ SOLN
INTRAMUSCULAR | Status: AC
  Filled 2021-11-09: qty 30

## 2021-11-09 MED ORDER — FENTANYL CITRATE (PF) 100 MCG/2ML IJ SOLN
INTRAMUSCULAR | Status: AC
  Filled 2021-11-09: qty 2

## 2021-11-09 NOTE — Procedures (Signed)
  Procedure:  FLuoro guided disc aspiration L2-3 --3.57ml thin bloody fluid, sent for GS, C&S Preprocedure diagnosis: Lumbar discitis  Postprocedure diagnosis: same EBL:    minimal Complications:   none immediate  See full dictation in [employer].  Thora Lance MD Main # [phone] Pager  [phone] Mobile [phone]

## 2021-11-09 NOTE — Progress Notes (Signed)
[facility]  Date of Admission:  10/14/2021     Total days of antibiotics 9         ASSESSMENT:  Glenn [name] continues to have back pain with IR aspiration preformed obtaining 3.5 cc of thin bloody fluid sent for gram stain/culture. Blood cultures from 11/07/21 have remained without growth to date. Continue current dose of Cefazolin with further recommendations follow clearance of blood cultures and aspiration results.  PLAN:  Continue current dose of Cefazolin. Monitor blood and aspiration cultures Remaining medical and supportive care per primary team.   Principal Problem:   Septic shock (Glenn [name]) Active Problems:   Bacteremia due to Klebsiella pneumoniae   Acute kidney injury ([place]enn [name])   Bladder outlet obstruction   Uncontrolled type 2 diabetes mellitus with hyperglycemia, with long-term current use of insulin ([place]enn [name])   Pacemaker   Discitis   Osteomyelitis of lumbar spine (Glenn [name])   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Glenn [name])   Chronic kidney disease, stage 3b (Glenn [name])   Coronary artery disease involving native coronary artery of native heart without angina pectoris   Chronic combined systolic and diastolic CHF (congestive heart failure) (Glenn [name])   Hyponatremia   Hypokalemia   Transaminitis   Myocardial injury   Thrombocytopenia (Glenn [name])   Anemia due to chronic kidney disease   Coagulopathy (Glenn [name])   Protein-calorie malnutrition, moderate (Glenn [name])   Paroxysmal atrial fibrillation (Glenn [name])   Foot ulcer (Glenn [name])    artificial tears   Both Eyes QHS   atorvastatin  80 mg Oral QHS   carvedilol  6.25 mg Oral BID WC   chlorhexidine  15 mL Mouth Rinse BID   Chlorhexidine Gluconate Cloth  6 each Topical Q0600   finasteride  5 mg Oral Daily   insulin aspart  0-15 Units Subcutaneous TID WC   insulin aspart  0-5 Units Subcutaneous QHS   insulin detemir  10 Units Subcutaneous Daily   ketotifen  1 drop Both Eyes BID   latanoprost  1 drop Both Eyes QHS   mouth rinse  15 mL Mouth Rinse q12n4p    multivitamin with minerals  1 tablet Oral Daily   pantoprazole  40 mg Oral Q1200   sodium chloride flush  10-40 mL Intracatheter Q12H   sodium chloride flush  10-40 mL Intracatheter Q12H   tamsulosin  0.8 mg Oral QHS   torsemide  20 mg Oral Daily    SUBJECTIVE:  Afebrile overnight with no acute events. Back pain is adequately controlled with medication as long as he is not moving very much. Has some swelling in his right upper extremity.  Allergies  Allergen Reactions   Heparin Other (See Comments)    Causes "head to feel hot"     Review of Systems: Review of Systems  Constitutional:  Negative for chills, fever and weight loss.  Respiratory:  Negative for cough, shortness of breath and wheezing.   Cardiovascular:  Negative for chest pain and leg swelling.  Gastrointestinal:  Negative for abdominal pain, constipation, diarrhea, nausea and vomiting.  Musculoskeletal:  Positive for back pain.  Skin:  Negative for rash.      OBJECTIVE: Vitals:   11/08/21 1700 11/08/21 2042 11/09/21 0457 11/09/21 0934  BP: 115/66 (!) 111/55 122/64 115/70  Pulse: 93 89 92 88  Resp: 16 18 18 18   Temp: 98.7 F (37.1 C) 98.9 F (37.2 C) 98.1 F (36.7 C) 97.6 F (36.4 C)  TempSrc: Oral  Oral Oral  SpO2: 98% 98% 100% 98%  Weight:  Height:       Body mass index is 20.53 kg/m.  Physical Exam Constitutional:      General: He is not in acute distress.    Appearance: He is well-developed.  Cardiovascular:     Rate and Rhythm: Normal rate and regular rhythm.     Heart sounds: Normal heart sounds.  Pulmonary:     Effort: Pulmonary effort is normal.     Breath sounds: Normal breath sounds.  Skin:    General: Skin is warm and dry.  Neurological:     Mental Status: He is alert and oriented to person, place, and time.  Psychiatric:        Behavior: Behavior normal.        Thought Content: Thought content normal.        Judgment: Judgment normal.     Lab Results Lab Results   Component Value Date   WBC 14.3 (H) 11/08/2021   HGB 10.5 (L) 11/08/2021   HCT 31.0 (L) 11/08/2021   MCV 81.2 11/08/2021   PLT 194 11/08/2021    Lab Results  Component Value Date   CREATININE 1.70 (H) 11/08/2021   BUN 62 (H) 11/08/2021   NA 133 (L) 11/08/2021   K 4.1 11/08/2021   CL 101 11/08/2021   CO2 22 11/08/2021    Lab Results  Component Value Date   ALT 6 11/08/2021   AST 23 11/08/2021   ALKPHOS 226 (H) 11/08/2021   BILITOT 0.9 11/08/2021     Microbiology: Recent Results (from the past 240 hour(s))  Resp Panel by RT-PCR (Flu A&B, Covid) Urine, Clean Catch     Status: None   Collection Time: 10/12/2021  5:07 PM   Specimen: Urine, Clean Catch; Nasal Swab  Result Value Ref Range Status   SARS Coronavirus 2 by RT PCR NEGATIVE NEGATIVE Final    Comment: (NOTE) SARS-CoV-2 target nucleic acids are NOT DETECTED.  The SARS-CoV-2 RNA is generally detectable in upper respiratory specimens during the acute phase of infection. The lowest concentration of SARS-CoV-2 viral copies this assay can detect is 138 copies/mL. A negative result does not preclude SARS-Cov-2 infection and should not be used as the sole basis for treatment or other patient management decisions. A negative result may occur with  improper specimen collection/handling, submission of specimen other than nasopharyngeal swab, presence of viral mutation(s) within the areas targeted by this assay, and inadequate number of viral copies(<138 copies/mL). A negative result must be combined with clinical observations, patient history, and epidemiological information. The expected result is Negative.  Fact Sheet for Patients:  EntrepreneurPulse.com.au  Fact Sheet for Healthcare Providers:  IncredibleEmployment.be  This test is no t yet approved or cleared by the [place] FDA and  has been authorized for detection and/or diagnosis of SARS-CoV-2 by FDA under an Emergency Use  Authorization (EUA). This EUA will remain  in effect (meaning this test can be used) for the duration of the COVID-19 declaration under Section 564(b)(1) of the Act, 21 U.S.C.section 360bbb-3(b)(1), unless the authorization is terminated  or revoked sooner.       Influenza A by PCR NEGATIVE NEGATIVE Final   Influenza B by PCR NEGATIVE NEGATIVE Final    Comment: (NOTE) The Xpert Xpress SARS-CoV-2/FLU/RSV plus assay is intended as an aid in the diagnosis of influenza from Nasopharyngeal swab specimens and should not be used as a sole basis for treatment. Nasal washings and aspirates are unacceptable for Xpert Xpress SARS-CoV-2/FLU/RSV testing.  Fact Sheet for Patients:  EntrepreneurPulse.com.au  Fact Sheet for Healthcare Providers: IncredibleEmployment.be  This test is not yet approved or cleared by the [place] FDA and has been authorized for detection and/or diagnosis of SARS-CoV-2 by FDA under an Emergency Use Authorization (EUA). This EUA will remain in effect (meaning this test can be used) for the duration of the COVID-19 declaration under Section 564(b)(1) of the Act, 21 U.S.C. section 360bbb-3(b)(1), unless the authorization is terminated or revoked.  Performed at [facility], [address] [place], [place] [address]   Blood Culture (routine x 2)     Status: Abnormal   Collection Time: 10/21/2021  5:07 PM   Specimen: BLOOD  Result Value Ref Range Status   Specimen Description   Final    BLOOD BLOOD LEFT HAND Performed at [facility], [address], [place], [place] [address]    Special Requests   Final    BOTTLES DRAWN AEROBIC AND ANAEROBIC Blood Culture adequate volume Performed at [facility], [address] [place], [place] [address]    Culture  Setup Time   Final    IN BOTH AEROBIC AND ANAEROBIC BOTTLES GRAM NEGATIVE RODS CRITICAL VALUE NOTED.  VALUE IS CONSISTENT WITH PREVIOUSLY  REPORTED AND CALLED VALUE. Performed at [facility], [address] [place], [place] [address]    Culture (A)  Final    KLEBSIELLA PNEUMONIAE SUSCEPTIBILITIES PERFORMED ON PREVIOUS CULTURE WITHIN THE LAST 5 DAYS. Performed at [facility], [address] [place], [place] [address]    Report Status 11/04/2021 FINAL  Final  Blood Culture (routine x 2)     Status: Abnormal   Collection Time: 10/16/2021  5:07 PM   Specimen: BLOOD  Result Value Ref Range Status   Specimen Description   Final    BLOOD BLOOD LEFT FOREARM Performed at [facility], [address] [place], [place] [address]    Special Requests   Final    BOTTLES DRAWN AEROBIC AND ANAEROBIC Blood Culture results may not be optimal due to an inadequate volume of blood received in culture bottles Performed at [facility], [address] [place], [place] [address]    Culture  Setup Time   Final    Organism ID to follow GRAM NEGATIVE RODS IN BOTH AEROBIC AND ANAEROBIC BOTTLES CRITICAL RESULT CALLED TO, READ BACK BY AND VERIFIED WITH: GREG ABBOTT @ [id number] ON 11/02/2021.Marland KitchenMarland KitchenTKR Performed at [facility], [address] [place], [place] [address]    Culture KLEBSIELLA PNEUMONIAE (A)  Final   Report Status 11/04/2021 FINAL  Final   Organism ID, Bacteria KLEBSIELLA PNEUMONIAE  Final      Susceptibility   Klebsiella pneumoniae - MIC*    AMPICILLIN RESISTANT Resistant     CEFAZOLIN <=4 SENSITIVE Sensitive     CEFEPIME <=0.12 SENSITIVE Sensitive     CEFTAZIDIME <=1 SENSITIVE Sensitive     CEFTRIAXONE <=0.25 SENSITIVE Sensitive     CIPROFLOXACIN <=0.25 SENSITIVE Sensitive     GENTAMICIN <=1 SENSITIVE Sensitive     IMIPENEM <=0.25 SENSITIVE Sensitive     TRIMETH/SULFA <=20 SENSITIVE Sensitive     AMPICILLIN/SULBACTAM 4 SENSITIVE Sensitive     PIP/TAZO 8 SENSITIVE Sensitive     * KLEBSIELLA PNEUMONIAE  Urine Culture     Status: Abnormal   Collection Time: 10/12/2021  5:07 PM    Specimen: In/Out Cath Urine  Result Value Ref Range Status   Specimen Description   Final    IN/OUT CATH URINE  Performed at [facility], [address] [place], [place] [address]    Special Requests   Final    NONE Performed at [facility], [address] [place], [place] [address]    Culture MULTIPLE SPECIES PRESENT, SUGGEST RECOLLECTION (A)  Final   Report Status 11/02/2021 FINAL  Final  Blood Culture ID Panel (Reflexed)     Status: Abnormal   Collection Time: 10/27/2021  5:07 PM  Result Value Ref Range Status   Enterococcus faecalis NOT DETECTED NOT DETECTED Final   Enterococcus Faecium NOT DETECTED NOT DETECTED Final   Listeria monocytogenes NOT DETECTED NOT DETECTED Final   Staphylococcus species NOT DETECTED NOT DETECTED Final   Staphylococcus aureus (BCID) NOT DETECTED NOT DETECTED Final   Staphylococcus epidermidis NOT DETECTED NOT DETECTED Final   Staphylococcus lugdunensis NOT DETECTED NOT DETECTED Final   Streptococcus species NOT DETECTED NOT DETECTED Final   Streptococcus agalactiae NOT DETECTED NOT DETECTED Final   Streptococcus pneumoniae NOT DETECTED NOT DETECTED Final   Streptococcus pyogenes NOT DETECTED NOT DETECTED Final   A.calcoaceticus-baumannii NOT DETECTED NOT DETECTED Final   Bacteroides fragilis NOT DETECTED NOT DETECTED Final   Enterobacterales DETECTED (A) NOT DETECTED Final    Comment: Enterobacterales represent a large order of gram negative bacteria, not a single organism. CRITICAL RESULT CALLED TO, READ BACK BY AND VERIFIED WITH: GREG ABBOTT @ [id number] ON 11/02/2021.Marland KitchenMarland KitchenTKR    Enterobacter cloacae complex NOT DETECTED NOT DETECTED Final   Escherichia coli NOT DETECTED NOT DETECTED Final   Klebsiella aerogenes NOT DETECTED NOT DETECTED Final   Klebsiella oxytoca NOT DETECTED NOT DETECTED Final   Klebsiella pneumoniae DETECTED (A) NOT DETECTED Final    Comment: CRITICAL RESULT CALLED TO, READ BACK BY AND VERIFIED WITH: GREG ABBOTT @  [id number] ON 11/02/2021.Marland KitchenMarland KitchenTKR    Proteus species NOT DETECTED NOT DETECTED Final   Salmonella species NOT DETECTED NOT DETECTED Final   Serratia marcescens NOT DETECTED NOT DETECTED Final   Haemophilus influenzae NOT DETECTED NOT DETECTED Final   Neisseria meningitidis NOT DETECTED NOT DETECTED Final   Pseudomonas aeruginosa NOT DETECTED NOT DETECTED Final   Stenotrophomonas maltophilia NOT DETECTED NOT DETECTED Final   Candida albicans NOT DETECTED NOT DETECTED Final   Candida auris NOT DETECTED NOT DETECTED Final   Candida glabrata NOT DETECTED NOT DETECTED Final   Candida krusei NOT DETECTED NOT DETECTED Final   Candida parapsilosis NOT DETECTED NOT DETECTED Final   Candida tropicalis NOT DETECTED NOT DETECTED Final   Cryptococcus neoformans/gattii NOT DETECTED NOT DETECTED Final   CTX-M ESBL NOT DETECTED NOT DETECTED Final   Carbapenem resistance IMP NOT DETECTED NOT DETECTED Final   Carbapenem resistance KPC NOT DETECTED NOT DETECTED Final   Carbapenem resistance NDM NOT DETECTED NOT DETECTED Final   Carbapenem resist OXA 48 LIKE NOT DETECTED NOT DETECTED Final   Carbapenem resistance VIM NOT DETECTED NOT DETECTED Final    Comment: Performed at [facility], [address] [place], [place] [address]  MRSA Next Gen by PCR, Nasal     Status: None   Collection Time: 10/21/2021 11:47 PM   Specimen: Nasal Mucosa; Nasal Swab  Result Value Ref Range Status   MRSA by PCR Next Gen NOT DETECTED NOT DETECTED Final    Comment: (NOTE) The GeneXpert MRSA Assay (FDA approved for NASAL specimens only), is one component of a comprehensive MRSA colonization surveillance program. It is not intended to diagnose MRSA infection nor to guide or monitor treatment for MRSA infections. Test  performance is not FDA approved in patients less than 56 years old. Performed at [facility], [address], [name], [place] [address]   Culture, blood (Routine X 2) w Reflex to ID Panel     Status:  None (Preliminary result)   Collection Time: 11/07/21 12:48 PM   Specimen: BLOOD  Result Value Ref Range Status   Specimen Description BLOOD BLOOD LEFT ARM  Final   Special Requests   Final    BOTTLES DRAWN AEROBIC AND ANAEROBIC Blood Culture adequate volume   Culture   Final    NO GROWTH < 24 HOURS Performed at [facility], [place] [address], [place], [place] [address]    Report Status PENDING  Incomplete  Culture, blood (Routine X 2) w Reflex to ID Panel     Status: None (Preliminary result)   Collection Time: 11/07/21  1:00 PM   Specimen: BLOOD  Result Value Ref Range Status   Specimen Description BLOOD BLOOD LEFT HAND  Final   Special Requests   Final    BOTTLES DRAWN AEROBIC AND ANAEROBIC Blood Culture adequate volume   Culture   Final    NO GROWTH < 24 HOURS Performed at [facility], [place] [address], [place], [place] [address]    Report Status PENDING  Incomplete     Terri Piedra, [facility]  11/09/2021  1:23 PM

## 2021-11-09 NOTE — Progress Notes (Signed)
Physical Therapy Treatment Patient Details Name: Glenn [name] MRN: [id number] DOB: Aug 19, 1940 Today's Date: 11/09/2021   History of Present Illness Patient is a 81 y/o male who presents on 5/31 with generalized weakness and falls. 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 PMH includes DM, HTN, MI, CAD s/p CABG, recent PPM placement and DCCV for Afib, CHF, CKD, BPH. L2-L3 disc aspiration planned for 11/09/21.    PT Comments    Patient progressing towards physical therapy goals. Patient able to progress ambulation distance slightly this session but continues to require minA for balance and safety with RW. Patient expressing exhaustion/fatigue at end of ambulation and requesting to return to supine and defer further therapy until after surgery. Educated patient on importance of mobility and maintaining strength, patient verbalized understanding. Continue to recommend acute inpatient rehab (AIR) for post-acute therapy needs.     Recommendations for follow up therapy are one component of a multi-disciplinary discharge planning process, led by the attending physician.  Recommendations may be updated based on patient status, additional functional criteria and insurance authorization.  Follow Up Recommendations  Acute inpatient rehab (3hours/day)     Assistance Recommended at Discharge Frequent or constant Supervision/Assistance  Patient can return home with the following A little help with walking and/or transfers;A little help with bathing/dressing/bathroom;Assistance with cooking/housework;Assist for transportation;Help with stairs or ramp for entrance   Equipment Recommendations  None recommended by PT    Recommendations for Other Services       Precautions / Restrictions Precautions Precautions: Fall Restrictions Weight Bearing Restrictions: No     Mobility  Bed  Mobility Overal bed mobility: Needs Assistance Bed Mobility: Rolling, Sidelying to Sit, Sit to Sidelying Rolling: Min guard Sidelying to sit: Min guard     Sit to sidelying: Min assist General bed mobility comments: min guard to reach EOB but minA to bring LEs back into bed    Transfers Overall transfer level: Needs assistance Equipment used: Rolling Shandria Clinch (2 wheels) Transfers: Sit to/from Stand Sit to Stand: Mod assist           General transfer comment: cues for hand placement. ModA to stand from low and elevated surface with posterior bias initially    Ambulation/Gait Ambulation/Gait assistance: Min assist Gait Distance (Feet): 30 Feet Assistive device: Rolling Kacy Conely (2 wheels) Gait Pattern/deviations: Step-to pattern, Shuffle, Decreased stride length, Trunk flexed, Narrow base of support Gait velocity: decreased     General Gait Details: Slowed gait speed. Cues for close RW proximity. MinA for balance and safety especially with turning   Stairs             Wheelchair Mobility    Modified Rankin (Stroke Patients Only)       Balance Overall balance assessment: Needs assistance Sitting-balance support: No upper extremity supported, Feet supported Sitting balance-Leahy Scale: Fair     Standing balance support: Reliant on assistive device for balance, Bilateral upper extremity supported Standing balance-Leahy Scale: Poor Standing balance comment: reliant on RW for support                            Cognition Arousal/Alertness: Awake/alert Behavior During Therapy: Flat affect Overall Cognitive Status: Within Functional Limits for tasks assessed  Exercises      General Comments        Pertinent Vitals/Pain Pain Assessment Pain Assessment: Faces Faces Pain Scale: Hurts little more Pain Location: back Pain Descriptors / Indicators: Discomfort Pain Intervention(s):  Monitored during session    Home Living                          Prior Function            PT Goals (current goals can now be found in the care plan section) Acute Rehab PT Goals PT Goal Formulation: With patient Time For Goal Achievement: 11/20/21 Potential to Achieve Goals: Good Progress towards PT goals: Progressing toward goals    Frequency    Min 3X/week      PT Plan Current plan remains appropriate    Co-evaluation              AM-PAC PT "6 Clicks" Mobility   Outcome Measure  Help needed turning from your back to your side while in a flat bed without using bedrails?: A Little Help needed moving from lying on your back to sitting on the side of a flat bed without using bedrails?: A Little Help needed moving to and from a bed to a chair (including a wheelchair)?: A Little Help needed standing up from a chair using your arms (e.g., wheelchair or bedside chair)?: A Little Help needed to walk in hospital room?: Total Help needed climbing 3-5 steps with a railing? : Total 6 Click Score: 14    End of Session Equipment Utilized During Treatment: Gait belt Activity Tolerance: Patient tolerated treatment well Patient left: in bed;with call bell/phone within reach;with bed alarm set Nurse Communication: Mobility status PT Visit Diagnosis: Unsteadiness on feet (R26.81);Other abnormalities of gait and mobility (R26.89);Muscle weakness (generalized) (M62.81);Pain     Time: [id number] PT Time Calculation (min) (ACUTE ONLY): 16 min  Charges:  $Gait Training: 8-22 mins                     Kapono Luhn A. Gilford Rile PT, DPT Acute Rehabilitation Services Office [phone]    Linna Hoff 11/09/2021, 1:04 PM

## 2021-11-09 NOTE — Progress Notes (Signed)
PROGRESS NOTE    Glenn [name]  [id number] DOB: 09/21/1940 DOA: 10/19/2021 PCP: System, Provider Not In    Brief Narrative:  Glenn [name] is a 81 y.o. 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   Evidently malaise and generalized weakness started few days prior to admission, had anorexia and poor PO intake, got weaker and weaker, fell and developed low back pain which became worse and worse, called EMS.   5/31: In the ER, BP 70/40, WBC 24K, RR 25, Na 129, INR 3, Cr 3.62 (baseline 2018 1.1, none since), CT showed bladder distension and ?discitis/osteo of the spine 6/1: Arrived to [facility] on pressors, blood cultures with K pneumoniae in 2/2, UCX multiple (and had urinary retention with 1.7L out), TTE no vegetations 6/2: MRI without drainable focus, pressors titrated off, transferred out of ICU. 6/5: IR consulted for disc aspiration    Assessment & Plan:   Septic shock secondary to bacteremia due to Klebsiella pneumonia Osteomyelitis of the lumbar spine.  Discitis and psoas abscess: Sepsis physiology is improving.  Currently remains on cefazolin.  Followed by infectious disease. IR consulted for disc aspiration, scheduled for today. Has a PICC line, ID to finalize antibiotic recommendations.  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  Self cath at home.  Currently needing in and out cath multiple times a day.  Keep Foley catheter until improvement in mobility.  Paroxysmal A-fib: Rate controlled.  On Coreg.  Eliquis on hold for procedure.  Will resume after procedure.  Chronic combined heart failure: Euvolemic.  Entresto on hold.  Coreg and torsemide resumed.  Type 2 diabetes uncontrolled with hyperglycemia: Hemoglobin A1c more than 10.  Blood sugars elevated now.  Increase dose of Levemir to 10 units today and monitor.  AKI on CKD stage IIIb: Creatinine improving  back to his levels of 1.6-1.7.  Avoid nephrotoxins.  Moderate protein calorie malnutrition: Nutrition Status: Nutrition Problem: Moderate Malnutrition Etiology: chronic illness (CHF) Signs/Symptoms: moderate fat depletion, severe muscle depletion Interventions: MVI, Liberalize Diet, Other (Comment) (double protein portions)   Foot ulcer: Pressure Injury 11/02/21 Foot Left;Lateral Deep Tissue Pressure Injury - Purple or maroon localized area of discolored intact skin or blood-filled blister due to damage of underlying soft tissue from pressure and/or shear. 5.5x4 (Active)  11/02/21   Location: Foot  Location Orientation: Left;Lateral  Staging: Deep Tissue Pressure Injury - Purple or maroon localized area of discolored intact skin or blood-filled blister due to damage of underlying soft tissue from pressure and/or shear.  Wound Description (Comments): 5.5x4  Present on Admission: Yes       DVT prophylaxis: Place and maintain sequential compression device Start: 11/02/21 1108   Code Status: Full code Family Communication: None Disposition Plan: Status is: Inpatient Remains inpatient appropriate because: Inpatient procedure planned.  Can be transferred to acute inpatient rehab after the procedure.     Consultants:  Infectious disease Critical care Interventional radiology  Procedures:  None  Antimicrobials:  Cefazolin   Subjective:  Patient seen and examined.  Pain is well controlled when he is not moving.  He is scared to move around and walk.  Aware about procedure today.  Objective: Vitals:   11/08/21 1700 11/08/21 2042 11/09/21 0457 11/09/21 0934  BP: 115/66 (!) 111/55 122/64 115/70  Pulse: 93 89 92 88  Resp: 16 18 18 18   Temp: 98.7 F (  37.1 C) 98.9 F (37.2 C) 98.1 F (36.7 C) 97.6 F (36.4 C)  TempSrc: Oral  Oral Oral  SpO2: 98% 98% 100% 98%  Weight:      Height:        Intake/Output Summary (Last 24 hours) at 11/09/2021 1131 Last data filed at  11/09/2021 0501 Gross per 24 hour  Intake 600 ml  Output 2300 ml  Net -1700 ml   Filed Weights   11/03/21 0600 11/04/21 0630 11/07/21 1500  Weight: 63.2 kg 64.2 kg 64.9 kg    Examination:  General exam: Appears calm and comfortable at rest. Mildly anxious.  Alert oriented x4.  Not in any distress at rest but in mild distress on mobility. Respiratory system: Clear to auscultation. Respiratory effort normal. Cardiovascular system: S1 & S2 heard, RRR. No JVD, murmurs, rubs, gallops or clicks. No pedal edema. Gastrointestinal system: Abdomen is nondistended, soft and nontender. No organomegaly or masses felt. Normal bowel sounds heard. Central nervous system: Alert and oriented. No focal neurological deficits. Extremities: Symmetric 5 x 5 power.     Data Reviewed: I have personally reviewed following labs and imaging studies  CBC: Recent Labs  Lab 11/04/21 0516 11/05/21 0513 11/06/21 0125 11/07/21 0319 11/08/21 0453  WBC 15.9* 13.9* 17.0* 14.1* 14.3*  HGB 11.8* 11.8* 11.7* 9.8* 10.5*  HCT 33.7* 34.0* 33.5* 30.0* 31.0*  MCV 78.6* 80.4 80.1 82.4 81.2  PLT 82* 83* 110* 146* [id number]   Basic Metabolic Panel: Recent Labs  Lab 11/03/21 0330 11/03/21 1036 11/04/21 0516 11/05/21 0513 11/06/21 0125 11/07/21 0319 11/08/21 0453  NA 133*  --  133* 130* 131* 133* 133*  K 3.0*   < > 3.6 4.4 4.5 4.3 4.1  CL 103  --  103 101 104 106 101  CO2 21*  --  22 19* 21* 21* 22  GLUCOSE 75  --  56* 270* 213* 199* 198*  BUN 98*  --  80* 75* 75* 64* 62*  CREATININE 2.26*  --  1.80* 1.90* 1.99* 1.61* 1.70*  CALCIUM 8.3*  --  8.6* 8.5* 8.7* 8.6* 8.4*  MG 2.0  --   --   --   --   --   --   PHOS 2.7  --   --   --   --   --   --    < > = values in this interval not displayed.   GFR: Estimated Creatinine Clearance: 32.9 mL/min (A) (by C-G formula based on SCr of 1.7 mg/dL (H)). Liver Function Tests: Recent Labs  Lab 11/03/21 0330 11/04/21 0516 11/05/21 0513 11/08/21 0453  AST 203* 121* 70*  23  ALT 146* 116* 82* 6  ALKPHOS 306* 281* 336* 226*  BILITOT 0.9 0.7 0.6 0.9  PROT 5.7* 5.7* 5.8* 5.9*  ALBUMIN 2.1* 1.9* 1.9* 1.8*   No results for input(s): "LIPASE", "AMYLASE" in the last 168 hours. No results for input(s): "AMMONIA" in the last 168 hours. Coagulation Profile: Recent Labs  Lab 11/03/21 0330 11/05/21 1000 11/06/21 0125 11/08/21 0453  INR 1.7* 1.4* 1.6* 1.3*   Cardiac Enzymes: Recent Labs  Lab 11/03/21 1036  CKTOTAL 81   BNP (last 3 results) No results for input(s): "PROBNP" in the last 8760 hours. HbA1C: No results for input(s): "HGBA1C" in the last 72 hours. CBG: Recent Labs  Lab 11/08/21 1125 11/08/21 1622 11/08/21 2041 11/09/21 0726 11/09/21 1126  GLUCAP 471* 300* 274* 297* 210*   Lipid Profile: No results for input(s): "CHOL", "HDL", "LDLCALC", "  TRIG", "CHOLHDL", "LDLDIRECT" in the last 72 hours. Thyroid Function Tests: No results for input(s): "TSH", "T4TOTAL", "FREET4", "T3FREE", "THYROIDAB" in the last 72 hours. Anemia Panel: No results for input(s): "VITAMINB12", "FOLATE", "FERRITIN", "TIBC", "IRON", "RETICCTPCT" in the last 72 hours. Sepsis Labs: No results for input(s): "PROCALCITON", "LATICACIDVEN" in the last 168 hours.   Recent Results (from the past 240 hour(s))  Resp Panel by RT-PCR (Flu A&B, Covid) Urine, Clean Catch     Status: None   Collection Time: 10/10/2021  5:07 PM   Specimen: Urine, Clean Catch; Nasal Swab  Result Value Ref Range Status   SARS Coronavirus 2 by RT PCR NEGATIVE NEGATIVE Final    Comment: (NOTE) SARS-CoV-2 target nucleic acids are NOT DETECTED.  The SARS-CoV-2 RNA is generally detectable in upper respiratory specimens during the acute phase of infection. The lowest concentration of SARS-CoV-2 viral copies this assay can detect is 138 copies/mL. A negative result does not preclude SARS-Cov-2 infection and should not be used as the sole basis for treatment or other patient management decisions. A  negative result may occur with  improper specimen collection/handling, submission of specimen other than nasopharyngeal swab, presence of viral mutation(s) within the areas targeted by this assay, and inadequate number of viral copies(<138 copies/mL). A negative result must be combined with clinical observations, patient history, and epidemiological information. The expected result is Negative.  Fact Sheet for Patients:  EntrepreneurPulse.com.au  Fact Sheet for Healthcare Providers:  IncredibleEmployment.be  This test is no t yet approved or cleared by the [place] FDA and  has been authorized for detection and/or diagnosis of SARS-CoV-2 by FDA under an Emergency Use Authorization (EUA). This EUA will remain  in effect (meaning this test can be used) for the duration of the COVID-19 declaration under Section 564(b)(1) of the Act, 21 U.S.C.section 360bbb-3(b)(1), unless the authorization is terminated  or revoked sooner.       Influenza A by PCR NEGATIVE NEGATIVE Final   Influenza B by PCR NEGATIVE NEGATIVE Final    Comment: (NOTE) The Xpert Xpress SARS-CoV-2/FLU/RSV plus assay is intended as an aid in the diagnosis of influenza from Nasopharyngeal swab specimens and should not be used as a sole basis for treatment. Nasal washings and aspirates are unacceptable for Xpert Xpress SARS-CoV-2/FLU/RSV testing.  Fact Sheet for Patients: EntrepreneurPulse.com.au  Fact Sheet for Healthcare Providers: IncredibleEmployment.be  This test is not yet approved or cleared by the [place] FDA and has been authorized for detection and/or diagnosis of SARS-CoV-2 by FDA under an Emergency Use Authorization (EUA). This EUA will remain in effect (meaning this test can be used) for the duration of the COVID-19 declaration under Section 564(b)(1) of the Act, 21 U.S.C. section 360bbb-3(b)(1), unless the authorization  is terminated or revoked.  Performed at [facility], [address] [place], [place] [address]   Blood Culture (routine x 2)     Status: Abnormal   Collection Time: 10/11/2021  5:07 PM   Specimen: BLOOD  Result Value Ref Range Status   Specimen Description   Final    BLOOD BLOOD LEFT HAND Performed at [facility], [address], [place], [place] [address]    Special Requests   Final    BOTTLES DRAWN AEROBIC AND ANAEROBIC Blood Culture adequate volume Performed at [facility], [address] [place], [place] [address]    Culture  Setup Time   Final    IN BOTH AEROBIC AND ANAEROBIC BOTTLES GRAM NEGATIVE RODS CRITICAL VALUE  NOTED.  VALUE IS CONSISTENT WITH PREVIOUSLY REPORTED AND CALLED VALUE. Performed at [facility], [address] [place], [place] [address]    Culture (A)  Final    KLEBSIELLA PNEUMONIAE SUSCEPTIBILITIES PERFORMED ON PREVIOUS CULTURE WITHIN THE LAST 5 DAYS. Performed at [facility], [place] [address], [place], [place] [address]    Report Status 11/04/2021 FINAL  Final  Blood Culture (routine x 2)     Status: Abnormal   Collection Time: 10/23/2021  5:07 PM   Specimen: BLOOD  Result Value Ref Range Status   Specimen Description   Final    BLOOD BLOOD LEFT FOREARM Performed at [facility], [address], [place], [place] [address]    Special Requests   Final    BOTTLES DRAWN AEROBIC AND ANAEROBIC Blood Culture results may not be optimal due to an inadequate volume of blood received in culture bottles Performed at [facility], [address] [place], [place] [address]    Culture  Setup Time   Final    Organism ID to follow GRAM NEGATIVE RODS IN BOTH AEROBIC AND ANAEROBIC BOTTLES CRITICAL RESULT CALLED TO, READ BACK BY AND VERIFIED WITH: GREG ABBOTT @ [id number] ON 11/02/2021.Marland KitchenMarland KitchenTKR Performed at [facility], [address] [place], [place] [address]    Culture KLEBSIELLA PNEUMONIAE  (A)  Final   Report Status 11/04/2021 FINAL  Final   Organism ID, Bacteria KLEBSIELLA PNEUMONIAE  Final      Susceptibility   Klebsiella pneumoniae - MIC*    AMPICILLIN RESISTANT Resistant     CEFAZOLIN <=4 SENSITIVE Sensitive     CEFEPIME <=0.12 SENSITIVE Sensitive     CEFTAZIDIME <=1 SENSITIVE Sensitive     CEFTRIAXONE <=0.25 SENSITIVE Sensitive     CIPROFLOXACIN <=0.25 SENSITIVE Sensitive     GENTAMICIN <=1 SENSITIVE Sensitive     IMIPENEM <=0.25 SENSITIVE Sensitive     TRIMETH/SULFA <=20 SENSITIVE Sensitive     AMPICILLIN/SULBACTAM 4 SENSITIVE Sensitive     PIP/TAZO 8 SENSITIVE Sensitive     * KLEBSIELLA PNEUMONIAE  Urine Culture     Status: Abnormal   Collection Time: 10/11/2021  5:07 PM   Specimen: In/Out Cath Urine  Result Value Ref Range Status   Specimen Description   Final    IN/OUT CATH URINE Performed at [facility], [address] [place], [place] [address]    Special Requests   Final    NONE Performed at [facility], [address] [place], [place] [address]    Culture MULTIPLE SPECIES PRESENT, SUGGEST RECOLLECTION (A)  Final   Report Status 11/02/2021 FINAL  Final  Blood Culture ID Panel (Reflexed)     Status: Abnormal   Collection Time: 10/13/2021  5:07 PM  Result Value Ref Range Status   Enterococcus faecalis NOT DETECTED NOT DETECTED Final   Enterococcus Faecium NOT DETECTED NOT DETECTED Final   Listeria monocytogenes NOT DETECTED NOT DETECTED Final   Staphylococcus species NOT DETECTED NOT DETECTED Final   Staphylococcus aureus (BCID) NOT DETECTED NOT DETECTED Final   Staphylococcus epidermidis NOT DETECTED NOT DETECTED Final   Staphylococcus lugdunensis NOT DETECTED NOT DETECTED Final   Streptococcus species NOT DETECTED NOT DETECTED Final   Streptococcus agalactiae NOT DETECTED NOT DETECTED Final   Streptococcus pneumoniae NOT DETECTED NOT DETECTED Final   Streptococcus pyogenes NOT DETECTED NOT DETECTED Final    A.calcoaceticus-baumannii NOT DETECTED NOT DETECTED Final   Bacteroides fragilis NOT DETECTED NOT DETECTED Final   Enterobacterales  DETECTED (A) NOT DETECTED Final    Comment: Enterobacterales represent a large order of gram negative bacteria, not a single organism. CRITICAL RESULT CALLED TO, READ BACK BY AND VERIFIED WITH: GREG ABBOTT @ [id number] ON 11/02/2021.Marland KitchenMarland KitchenTKR    Enterobacter cloacae complex NOT DETECTED NOT DETECTED Final   Escherichia coli NOT DETECTED NOT DETECTED Final   Klebsiella aerogenes NOT DETECTED NOT DETECTED Final   Klebsiella oxytoca NOT DETECTED NOT DETECTED Final   Klebsiella pneumoniae DETECTED (A) NOT DETECTED Final    Comment: CRITICAL RESULT CALLED TO, READ BACK BY AND VERIFIED WITH: GREG ABBOTT @ [id number] ON 11/02/2021.Marland KitchenMarland KitchenTKR    Proteus species NOT DETECTED NOT DETECTED Final   Salmonella species NOT DETECTED NOT DETECTED Final   Serratia marcescens NOT DETECTED NOT DETECTED Final   Haemophilus influenzae NOT DETECTED NOT DETECTED Final   Neisseria meningitidis NOT DETECTED NOT DETECTED Final   Pseudomonas aeruginosa NOT DETECTED NOT DETECTED Final   Stenotrophomonas maltophilia NOT DETECTED NOT DETECTED Final   Candida albicans NOT DETECTED NOT DETECTED Final   Candida auris NOT DETECTED NOT DETECTED Final   Candida glabrata NOT DETECTED NOT DETECTED Final   Candida krusei NOT DETECTED NOT DETECTED Final   Candida parapsilosis NOT DETECTED NOT DETECTED Final   Candida tropicalis NOT DETECTED NOT DETECTED Final   Cryptococcus neoformans/gattii NOT DETECTED NOT DETECTED Final   CTX-M ESBL NOT DETECTED NOT DETECTED Final   Carbapenem resistance IMP NOT DETECTED NOT DETECTED Final   Carbapenem resistance KPC NOT DETECTED NOT DETECTED Final   Carbapenem resistance NDM NOT DETECTED NOT DETECTED Final   Carbapenem resist OXA 48 LIKE NOT DETECTED NOT DETECTED Final   Carbapenem resistance VIM NOT DETECTED NOT DETECTED Final    Comment: Performed at [facility],  [address] [place], [place] [address]  MRSA Next Gen by PCR, Nasal     Status: None   Collection Time: 10/05/2021 11:47 PM   Specimen: Nasal Mucosa; Nasal Swab  Result Value Ref Range Status   MRSA by PCR Next Gen NOT DETECTED NOT DETECTED Final    Comment: (NOTE) The GeneXpert MRSA Assay (FDA approved for NASAL specimens only), is one component of a comprehensive MRSA colonization surveillance program. It is not intended to diagnose MRSA infection nor to guide or monitor treatment for MRSA infections. Test performance is not FDA approved in patients less than 61 years old. Performed at [facility], [place] [address], [place], [place] [address]   Culture, blood (Routine X 2) w Reflex to ID Panel     Status: None (Preliminary result)   Collection Time: 11/07/21 12:48 PM   Specimen: BLOOD  Result Value Ref Range Status   Specimen Description BLOOD BLOOD LEFT ARM  Final   Special Requests   Final    BOTTLES DRAWN AEROBIC AND ANAEROBIC Blood Culture adequate volume   Culture   Final    NO GROWTH < 24 HOURS Performed at [facility], [place] [address], [place], [place] [address]    Report Status PENDING  Incomplete  Culture, blood (Routine X 2) w Reflex to ID Panel     Status: None (Preliminary result)   Collection Time: 11/07/21  1:00 PM   Specimen: BLOOD  Result Value Ref Range Status   Specimen Description BLOOD BLOOD LEFT HAND  Final   Special Requests   Final    BOTTLES DRAWN AEROBIC AND ANAEROBIC Blood Culture adequate volume   Culture   Final    NO  GROWTH < 24 HOURS Performed at [facility], [address], [place], [place] [address]    Report Status PENDING  Incomplete         Radiology Studies: No results found.      Scheduled Meds:  artificial tears   Both Eyes QHS   atorvastatin  80 mg Oral QHS   carvedilol  6.25 mg Oral BID WC   chlorhexidine  15 mL Mouth Rinse BID   Chlorhexidine Gluconate Cloth  6 each Topical Q0600   finasteride  5  mg Oral Daily   insulin aspart  0-15 Units Subcutaneous TID WC   insulin aspart  0-5 Units Subcutaneous QHS   insulin detemir  10 Units Subcutaneous Daily   ketotifen  1 drop Both Eyes BID   latanoprost  1 drop Both Eyes QHS   mouth rinse  15 mL Mouth Rinse q12n4p   multivitamin with minerals  1 tablet Oral Daily   pantoprazole  40 mg Oral Q1200   sodium chloride flush  10-40 mL Intracatheter Q12H   sodium chloride flush  10-40 mL Intracatheter Q12H   tamsulosin  0.8 mg Oral QHS   torsemide  20 mg Oral Daily   Continuous Infusions:  sodium chloride 250 mL (11/02/21 0205)   sodium chloride Stopped (11/04/21 0512)    ceFAZolin (ANCEF) IV 2 g (11/09/21 0535)     LOS: 8 days    Time spent: 35 minutes    Barb Merino, MD [facility] Pager [phone]

## 2021-11-09 NOTE — Progress Notes (Signed)
Occupational Therapy Treatment Patient Details Name: Glenn [name] MRN: [id number] DOB: 1941-04-28 Today's Date: 11/09/2021   History of present illness Patient is a 81 y/o male who presents on 5/31 with generalized weakness and falls. 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 PMH includes DM, HTN, MI, CAD s/p CABG, recent PPM placement and DCCV for Afib, CHF, CKD, BPH. L2-L3 disc aspiration planned for 11/09/21.   OT comments  Pt declined OOB despite education on benefits. Completed bed level grooming and UB exercises with level 2 theraband.    Recommendations for follow up therapy are one component of a multi-disciplinary discharge planning process, led by the attending physician.  Recommendations may be updated based on patient status, additional functional criteria and insurance authorization.    Follow Up Recommendations  Acute inpatient rehab (3hours/day)    Assistance Recommended at Discharge Intermittent Supervision/Assistance  Patient can return home with the following  A little help with walking and/or transfers;A little help with bathing/dressing/bathroom;Assistance with cooking/housework;Direct supervision/assist for medications management;Direct supervision/assist for financial management;Assist for transportation   Equipment Recommendations  Other (comment) (defer to AIR)    Recommendations for Other Services      Precautions / Restrictions Precautions Precautions: Fall Restrictions Weight Bearing Restrictions: No       Mobility Bed Mobility                    Transfers                   General transfer comment: declined OOB     Balance                                           ADL either performed or assessed with clinical judgement   ADL Overall ADL's : Needs assistance/impaired     Grooming: Wash/dry  face;Oral care;Bed level;Set up                                      Extremity/Trunk Assessment              Vision       Perception     Praxis      Cognition Arousal/Alertness: Awake/alert Behavior During Therapy: Flat affect Overall Cognitive Status: Within Functional Limits for tasks assessed                                 General Comments: educated in importance of activity to regain strength, pt stating he "just needed to eat and rest to regain his strength"        Exercises Exercises: General Upper Extremity General Exercises - Upper Extremity Shoulder Flexion: Strengthening, Both, 10 reps, Supine, Theraband Theraband Level (Shoulder Flexion): Level 2 (Red) Shoulder Horizontal ABduction: Strengthening, Both, 10 reps, Supine, Theraband Theraband Level (Shoulder Horizontal Abduction): Level 2 (Red)    Shoulder Instructions       General Comments      Pertinent Vitals/ Pain       Pain Assessment Pain Assessment: Faces Faces Pain Scale: Hurts little more Pain Location: back Pain Descriptors / Indicators: Discomfort Pain Intervention(s): Patient requesting pain meds-RN notified  Home Living  Prior Functioning/Environment              Frequency  Min 2X/week        Progress Toward Goals  OT Goals(current goals can now be found in the care plan section)  Progress towards OT goals: Progressing toward goals  Acute Rehab OT Goals OT Goal Formulation: With patient Time For Goal Achievement: 11/21/21 Potential to Achieve Goals: Good  Plan Discharge plan remains appropriate    Co-evaluation                 AM-PAC OT "6 Clicks" Daily Activity     Outcome Measure   Help from another person eating meals?: None Help from another person taking care of personal grooming?: A Little Help from another person toileting, which includes using toliet, bedpan,  or urinal?: A Little Help from another person bathing (including washing, rinsing, drying)?: A Little Help from another person to put on and taking off regular upper body clothing?: A Little Help from another person to put on and taking off regular lower body clothing?: A Little 6 Click Score: 19    End of Session    OT Visit Diagnosis: Unsteadiness on feet (R26.81);Muscle weakness (generalized) (M62.81);Pain;Other symptoms and signs involving cognitive function   Activity Tolerance Other (comment) (pt self limiting)   Patient Left in bed;with call bell/phone within reach;with bed alarm set   Nurse Communication Patient requests pain meds        Time: 4270-6237 OT Time Calculation (min): 23 min  Charges: OT General Charges $OT Visit: 1 Visit OT Treatments $Self Care/Home Management : 8-22 mins $Therapeutic Exercise: 8-22 mins  Martie Round, OTR/L Acute Rehabilitation Services Pager: [phone] Office: [phone]  Evern Bio 11/09/2021, 11:01 AM

## 2021-11-10 ENCOUNTER — Inpatient Hospital Stay (HOSPITAL_COMMUNITY): Payer: Medicare Other

## 2021-11-10 DIAGNOSIS — R7881 Bacteremia: Secondary | ICD-10-CM | POA: Diagnosis not present

## 2021-11-10 DIAGNOSIS — A419 Sepsis, unspecified organism: Secondary | ICD-10-CM | POA: Diagnosis not present

## 2021-11-10 DIAGNOSIS — M7989 Other specified soft tissue disorders: Secondary | ICD-10-CM | POA: Diagnosis not present

## 2021-11-10 DIAGNOSIS — R6521 Severe sepsis with septic shock: Secondary | ICD-10-CM | POA: Diagnosis not present

## 2021-11-10 DIAGNOSIS — M4646 Discitis, unspecified, lumbar region: Secondary | ICD-10-CM | POA: Diagnosis not present

## 2021-11-10 LAB — GLUCOSE, CAPILLARY
Glucose-Capillary: 140 mg/dL — ABNORMAL HIGH (ref 70–99)
Glucose-Capillary: 250 mg/dL — ABNORMAL HIGH (ref 70–99)
Glucose-Capillary: 245 mg/dL — ABNORMAL HIGH (ref 70–99)
Glucose-Capillary: 235 mg/dL — ABNORMAL HIGH (ref 70–99)

## 2021-11-10 MED ORDER — APIXABAN 5 MG PO TABS
5.0000 mg | ORAL_TABLET | Freq: Two times a day (BID) | ORAL | Status: DC
  Administered 2021-11-10 – 2021-12-04 (×49): 5 mg via ORAL
  Filled 2021-11-10 (×49): qty 1

## 2021-11-10 MED ORDER — POLYETHYLENE GLYCOL 3350 17 G PO PACK
17.0000 g | PACK | Freq: Every day | ORAL | Status: DC
  Administered 2021-11-10 – 2021-11-13 (×4): 17 g via ORAL
  Filled 2021-11-10 (×4): qty 1

## 2021-11-10 NOTE — Progress Notes (Signed)
PROGRESS NOTE    Glenn [name]  [id number] DOB: [birth year] DOA: 10/22/2021 PCP: System, Provider Not In    Brief Narrative:  Glenn [name] is a 81 y.o. 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   Evidently malaise and generalized weakness started few days prior to admission, had anorexia and poor PO intake, got weaker and weaker, fell and developed low back pain that became worse and called EMS.   5/31: In the ER, BP 70/40, WBC 24K, RR 25, Na 129, INR 3, Cr 3.62 (baseline 2018 1.1, none since), CT showed bladder distension and ?discitis/osteo of the spine 6/1: Arrived to [facility] on pressors, blood cultures with K pneumoniae in 2/2, UCX multiple (and had urinary retention with 1.7L out), TTE no vegetations 6/2: MRI without drainable focus, pressors titrated off, transferred out of ICU. 6/8: IR disc aspiration , cultures pending.    Assessment & Plan:   Septic shock secondary to bacteremia due to Klebsiella pneumonia Osteomyelitis of the lumbar spine.  Discitis and psoas abscess: Sepsis physiology is improving.  Currently remains on cefazolin.  Followed by infectious disease. Wound cultures pending from disc aspiration, probably will not change management. Has a PICC line, ID to finalize antibiotic recommendations.  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  Self cath at home.  Foley catheter placed due to recurrent retention.  Continue until improved mobility.  Paroxysmal A-fib: Rate controlled.  On Coreg.  Resume Eliquis tonight.   Chronic combined heart failure: Euvolemic.  Entresto on hold.  Coreg and torsemide resumed.  Type 2 diabetes uncontrolled with hyperglycemia: Hemoglobin A1c more than 10.   Blood sugars better today.  Will gradually go up on insulin doses.  AKI on CKD stage IIIb: Creatinine improving back to his levels of 1.6-1.7.  Avoid  nephrotoxins.  Moderate protein calorie malnutrition: Nutrition Status: Nutrition Problem: Moderate Malnutrition Etiology: chronic illness (CHF) Signs/Symptoms: moderate fat depletion, severe muscle depletion Interventions: MVI, Liberalize Diet, Other (Comment) (double protein portions)   Foot ulcer: Pressure Injury 11/02/21 Foot Left;Lateral Deep Tissue Pressure Injury - Purple or maroon localized area of discolored intact skin or blood-filled blister due to damage of underlying soft tissue from pressure and/or shear. 5.5x4 (Active)  11/02/21   Location: Foot  Location Orientation: Left;Lateral  Staging: Deep Tissue Pressure Injury - Purple or maroon localized area of discolored intact skin or blood-filled blister due to damage of underlying soft tissue from pressure and/or shear.  Wound Description (Comments): 5.5x4  Present on Admission: Yes       DVT prophylaxis: Place and maintain sequential compression device Start: 11/02/21 1108 apixaban (ELIQUIS) tablet 5 mg   Code Status: Full code Family Communication: None Disposition Plan: Status is: Inpatient Remains inpatient appropriate because: Waiting to transfer to acute inpatient rehab.  Medically stable.   Consultants:  Infectious disease Critical care Interventional radiology  Procedures:  None  Antimicrobials:  Cefazolin   Subjective:  Patient seen and examined.  No overnight events.  Pain is controlled. Right arm PICC line surrounding area with induration and infiltration, no evidence of DVT on duplex exam.  Will need to remove PICC line.  Objective: Vitals:   11/09/21 2050 11/10/21 0533 11/10/21 0919 11/10/21 0920  BP: 116/72 (!) 113/59  (!) 107/53  Pulse: 95 85 79 81  Resp: 16 18 18    Temp: 98.5 F (36.9 C) 98.3  F (36.8 C) 98.7 F (37.1 C)   TempSrc: Oral  Oral   SpO2: 100% 98% 99%   Weight:      Height:        Intake/Output Summary (Last 24 hours) at 11/10/2021 1411 Last data filed at 11/10/2021  0600 Gross per 24 hour  Intake 1358.46 ml  Output 1950 ml  Net -591.54 ml   Filed Weights   11/03/21 0600 11/04/21 0630 11/07/21 1500  Weight: 63.2 kg 64.2 kg 64.9 kg    Examination:  General exam: Appears calm and comfortable at rest. Normal mood.  Alert oriented x4.  Not in any distress at rest  Respiratory system: Clear to auscultation. Respiratory effort normal. Cardiovascular system: S1 & S2 heard, RRR. No JVD, murmurs, rubs, gallops or clicks. No pedal edema. Gastrointestinal system: Abdomen is nondistended, soft and nontender. No organomegaly or masses felt. Normal bowel sounds heard. Central nervous system: Alert and oriented. No focal neurological deficits. Extremities: Symmetric 5 x 5 power. Right arm and forearm with induration and firm swelling but no tenderness. Duplex is negative for DVT.  Distal neurovascular status intact.     Data Reviewed: I have personally reviewed following labs and imaging studies  CBC: Recent Labs  Lab 11/04/21 0516 11/05/21 0513 11/06/21 0125 11/07/21 0319 11/08/21 0453  WBC 15.9* 13.9* 17.0* 14.1* 14.3*  HGB 11.8* 11.8* 11.7* 9.8* 10.5*  HCT 33.7* 34.0* 33.5* 30.0* 31.0*  MCV 78.6* 80.4 80.1 82.4 81.2  PLT 82* 83* 110* 146* [id number]   Basic Metabolic Panel: Recent Labs  Lab 11/04/21 0516 11/05/21 0513 11/06/21 0125 11/07/21 0319 11/08/21 0453  NA 133* 130* 131* 133* 133*  K 3.6 4.4 4.5 4.3 4.1  CL 103 101 104 106 101  CO2 22 19* 21* 21* 22  GLUCOSE 56* 270* 213* 199* 198*  BUN 80* 75* 75* 64* 62*  CREATININE 1.80* 1.90* 1.99* 1.61* 1.70*  CALCIUM 8.6* 8.5* 8.7* 8.6* 8.4*   GFR: Estimated Creatinine Clearance: 32.9 mL/min (A) (by C-G formula based on SCr of 1.7 mg/dL (H)). Liver Function Tests: Recent Labs  Lab 11/04/21 0516 11/05/21 0513 11/08/21 0453  AST 121* 70* 23  ALT 116* 82* 6  ALKPHOS 281* 336* 226*  BILITOT 0.7 0.6 0.9  PROT 5.7* 5.8* 5.9*  ALBUMIN 1.9* 1.9* 1.8*   No results for input(s): "LIPASE",  "AMYLASE" in the last 168 hours. No results for input(s): "AMMONIA" in the last 168 hours. Coagulation Profile: Recent Labs  Lab 11/05/21 1000 11/06/21 0125 11/08/21 0453  INR 1.4* 1.6* 1.3*   Cardiac Enzymes: No results for input(s): "CKTOTAL", "CKMB", "CKMBINDEX", "TROPONINI" in the last 168 hours.  BNP (last 3 results) No results for input(s): "PROBNP" in the last 8760 hours. HbA1C: No results for input(s): "HGBA1C" in the last 72 hours. CBG: Recent Labs  Lab 11/09/21 1126 11/09/21 1702 11/09/21 2052 11/10/21 0716 11/10/21 1133  GLUCAP 210* 88 124* 140* 250*   Lipid Profile: No results for input(s): "CHOL", "HDL", "LDLCALC", "TRIG", "CHOLHDL", "LDLDIRECT" in the last 72 hours. Thyroid Function Tests: No results for input(s): "TSH", "T4TOTAL", "FREET4", "T3FREE", "THYROIDAB" in the last 72 hours. Anemia Panel: No results for input(s): "VITAMINB12", "FOLATE", "FERRITIN", "TIBC", "IRON", "RETICCTPCT" in the last 72 hours. Sepsis Labs: No results for input(s): "PROCALCITON", "LATICACIDVEN" in the last 168 hours.   Recent Results (from the past 240 hour(s))  Resp Panel by RT-PCR (Flu A&B, Covid) Urine, Clean Catch     Status: None   Collection Time: 10/25/2021  5:07 PM   Specimen: Urine, Clean Catch; Nasal Swab  Result Value Ref Range Status   SARS Coronavirus 2 by RT PCR NEGATIVE NEGATIVE Final    Comment: (NOTE) SARS-CoV-2 target nucleic acids are NOT DETECTED.  The SARS-CoV-2 RNA is generally detectable in upper respiratory specimens during the acute phase of infection. The lowest concentration of SARS-CoV-2 viral copies this assay can detect is 138 copies/mL. A negative result does not preclude SARS-Cov-2 infection and should not be used as the sole basis for treatment or other patient management decisions. A negative result may occur with  improper specimen collection/handling, submission of specimen other than nasopharyngeal swab, presence of viral mutation(s)  within the areas targeted by this assay, and inadequate number of viral copies(<138 copies/mL). A negative result must be combined with clinical observations, patient history, and epidemiological information. The expected result is Negative.  Fact Sheet for Patients:  EntrepreneurPulse.com.au  Fact Sheet for Healthcare Providers:  IncredibleEmployment.be  This test is no t yet approved or cleared by the [place] FDA and  has been authorized for detection and/or diagnosis of SARS-CoV-2 by FDA under an Emergency Use Authorization (EUA). This EUA will remain  in effect (meaning this test can be used) for the duration of the COVID-19 declaration under Section 564(b)(1) of the Act, 21 U.S.C.section 360bbb-3(b)(1), unless the authorization is terminated  or revoked sooner.       Influenza A by PCR NEGATIVE NEGATIVE Final   Influenza B by PCR NEGATIVE NEGATIVE Final    Comment: (NOTE) The Xpert Xpress SARS-CoV-2/FLU/RSV plus assay is intended as an aid in the diagnosis of influenza from Nasopharyngeal swab specimens and should not be used as a sole basis for treatment. Nasal washings and aspirates are unacceptable for Xpert Xpress SARS-CoV-2/FLU/RSV testing.  Fact Sheet for Patients: EntrepreneurPulse.com.au  Fact Sheet for Healthcare Providers: IncredibleEmployment.be  This test is not yet approved or cleared by the [place] FDA and has been authorized for detection and/or diagnosis of SARS-CoV-2 by FDA under an Emergency Use Authorization (EUA). This EUA will remain in effect (meaning this test can be used) for the duration of the COVID-19 declaration under Section 564(b)(1) of the Act, 21 U.S.C. section 360bbb-3(b)(1), unless the authorization is terminated or revoked.  Performed at [facility], [address] [place], [place] [address]   Blood Culture (routine x 2)     Status:  Abnormal   Collection Time: 10/31/2021  5:07 PM   Specimen: BLOOD  Result Value Ref Range Status   Specimen Description   Final    BLOOD BLOOD LEFT HAND Performed at [facility], [address] [place], [place] [address]    Special Requests   Final    BOTTLES DRAWN AEROBIC AND ANAEROBIC Blood Culture adequate volume Performed at [facility], [address] [place], [place] [address]    Culture  Setup Time   Final    IN BOTH AEROBIC AND ANAEROBIC BOTTLES GRAM NEGATIVE RODS CRITICAL VALUE NOTED.  VALUE IS CONSISTENT WITH PREVIOUSLY REPORTED AND CALLED VALUE. Performed at [facility], [address] [place], [place] [address]    Culture (A)  Final    KLEBSIELLA PNEUMONIAE SUSCEPTIBILITIES PERFORMED ON PREVIOUS CULTURE WITHIN THE LAST 5 DAYS. Performed at [facility], [place] [address] [place], [place] [address]    Report Status 11/04/2021 FINAL  Final  Blood Culture (routine x 2)     Status: Abnormal   Collection Time: 10/07/2021  5:07 PM  Specimen: BLOOD  Result Value Ref Range Status   Specimen Description   Final    BLOOD BLOOD LEFT FOREARM Performed at [facility], [address] [place], [place] [address]    Special Requests   Final    BOTTLES DRAWN AEROBIC AND ANAEROBIC Blood Culture results may not be optimal due to an inadequate volume of blood received in culture bottles Performed at [facility], [address] [place], [place] [address]    Culture  Setup Time   Final    Organism ID to follow GRAM NEGATIVE RODS IN BOTH AEROBIC AND ANAEROBIC BOTTLES CRITICAL RESULT CALLED TO, READ BACK BY AND VERIFIED WITH: GREG ABBOTT @ [id number] ON 11/02/2021.Marland KitchenMarland KitchenTKR Performed at [facility], [address] [place], [place] [address]    Culture KLEBSIELLA PNEUMONIAE (A)  Final   Report Status 11/04/2021 FINAL  Final   Organism ID, Bacteria KLEBSIELLA PNEUMONIAE  Final      Susceptibility   Klebsiella pneumoniae -  MIC*    AMPICILLIN RESISTANT Resistant     CEFAZOLIN <=4 SENSITIVE Sensitive     CEFEPIME <=0.12 SENSITIVE Sensitive     CEFTAZIDIME <=1 SENSITIVE Sensitive     CEFTRIAXONE <=0.25 SENSITIVE Sensitive     CIPROFLOXACIN <=0.25 SENSITIVE Sensitive     GENTAMICIN <=1 SENSITIVE Sensitive     IMIPENEM <=0.25 SENSITIVE Sensitive     TRIMETH/SULFA <=20 SENSITIVE Sensitive     AMPICILLIN/SULBACTAM 4 SENSITIVE Sensitive     PIP/TAZO 8 SENSITIVE Sensitive     * KLEBSIELLA PNEUMONIAE  Urine Culture     Status: Abnormal   Collection Time: 10/15/2021  5:07 PM   Specimen: In/Out Cath Urine  Result Value Ref Range Status   Specimen Description   Final    IN/OUT CATH URINE Performed at [facility], [address] [place], [place] [address]    Special Requests   Final    NONE Performed at [facility], [address] [place],  [address]    Culture MULTIPLE SPECIES PRESENT, SUGGEST RECOLLECTION (A)  Final   Report Status 11/02/2021 FINAL  Final  Blood Culture ID Panel (Reflexed)     Status: Abnormal   Collection Time: 10/08/2021  5:07 PM  Result Value Ref Range Status   Enterococcus faecalis NOT DETECTED NOT DETECTED Final   Enterococcus Faecium NOT DETECTED NOT DETECTED Final   Listeria monocytogenes NOT DETECTED NOT DETECTED Final   Staphylococcus species NOT DETECTED NOT DETECTED Final   Staphylococcus aureus (BCID) NOT DETECTED NOT DETECTED Final   Staphylococcus epidermidis NOT DETECTED NOT DETECTED Final   Staphylococcus lugdunensis NOT DETECTED NOT DETECTED Final   Streptococcus species NOT DETECTED NOT DETECTED Final   Streptococcus agalactiae NOT DETECTED NOT DETECTED Final   Streptococcus pneumoniae NOT DETECTED NOT DETECTED Final   Streptococcus pyogenes NOT DETECTED NOT DETECTED Final   A.calcoaceticus-baumannii NOT DETECTED NOT DETECTED Final   Bacteroides fragilis NOT DETECTED NOT DETECTED Final   Enterobacterales DETECTED (A) NOT DETECTED Final     Comment: Enterobacterales represent a large order of gram negative bacteria, not a single organism. CRITICAL RESULT CALLED TO, READ BACK BY AND VERIFIED WITH: GREG ABBOTT @ [id number] ON 11/02/2021.Marland KitchenMarland KitchenTKR    Enterobacter cloacae complex NOT DETECTED NOT DETECTED Final   Escherichia coli NOT DETECTED NOT DETECTED Final   Klebsiella aerogenes NOT DETECTED NOT DETECTED Final   Klebsiella oxytoca NOT DETECTED NOT DETECTED Final   Klebsiella pneumoniae DETECTED (A) NOT DETECTED Final    Comment:  CRITICAL RESULT CALLED TO, READ BACK BY AND VERIFIED WITH: GREG ABBOTT @ 0359 ON 11/02/2021.Marland Kitchen.Marland Kitchen.TKR    Proteus species NOT DETECTED NOT DETECTED Final   Salmonella species NOT DETECTED NOT DETECTED Final   Serratia marcescens NOT DETECTED NOT DETECTED Final   Haemophilus influenzae NOT DETECTED NOT DETECTED Final   Neisseria meningitidis NOT DETECTED NOT DETECTED Final   Pseudomonas aeruginosa NOT DETECTED NOT DETECTED Final   Stenotrophomonas maltophilia NOT DETECTED NOT DETECTED Final   Candida albicans NOT DETECTED NOT DETECTED Final   Candida auris NOT DETECTED NOT DETECTED Final   Candida glabrata NOT DETECTED NOT DETECTED Final   Candida krusei NOT DETECTED NOT DETECTED Final   Candida parapsilosis NOT DETECTED NOT DETECTED Final   Candida tropicalis NOT DETECTED NOT DETECTED Final   Cryptococcus neoformans/gattii NOT DETECTED NOT DETECTED Final   CTX-M ESBL NOT DETECTED NOT DETECTED Final   Carbapenem resistance IMP NOT DETECTED NOT DETECTED Final   Carbapenem resistance KPC NOT DETECTED NOT DETECTED Final   Carbapenem resistance NDM NOT DETECTED NOT DETECTED Final   Carbapenem resist OXA 48 LIKE NOT DETECTED NOT DETECTED Final   Carbapenem resistance VIM NOT DETECTED NOT DETECTED Final    Comment: Performed at [facility], [address] [place], [place] [address]  MRSA Next Gen by PCR, Nasal     Status: None   Collection Time: 06-29-21 11:47 PM   Specimen: Nasal Mucosa; Nasal Swab   Result Value Ref Range Status   MRSA by PCR Next Gen NOT DETECTED NOT DETECTED Final    Comment: (NOTE) The GeneXpert MRSA Assay (FDA approved for NASAL specimens only), is one component of a comprehensive MRSA colonization surveillance program. It is not intended to diagnose MRSA infection nor to guide or monitor treatment for MRSA infections. Test performance is not FDA approved in patients less than 79 years old. Performed at [facility], [address] [place], [place] [address]   Culture, blood (Routine X 2) w Reflex to ID Panel     Status: None (Preliminary result)   Collection Time: 11/07/21 12:48 PM   Specimen: BLOOD  Result Value Ref Range Status   Specimen Description BLOOD BLOOD LEFT ARM  Final   Special Requests   Final    BOTTLES DRAWN AEROBIC AND ANAEROBIC Blood Culture adequate volume   Culture   Final    NO GROWTH 3 DAYS Performed at [facility], [address] [place], [place] [address]    Report Status PENDING  Incomplete  Culture, blood (Routine X 2) w Reflex to ID Panel     Status: None (Preliminary result)   Collection Time: 11/07/21  1:00 PM   Specimen: BLOOD  Result Value Ref Range Status   Specimen Description BLOOD BLOOD LEFT HAND  Final   Special Requests   Final    BOTTLES DRAWN AEROBIC AND ANAEROBIC Blood Culture adequate volume   Culture   Final    NO GROWTH 3 DAYS Performed at [facility], [address] [place], [place] [address]    Report Status PENDING  Incomplete  Aerobic/Anaerobic Culture w Gram Stain (surgical/deep wound)     Status: None (Preliminary result)   Collection Time: 11/09/21  2:47 PM   Specimen: Abscess  Result Value Ref Range Status   Specimen Description ABSCESS  Final   Special Requests INVERTEBRAL DISC  Final   Gram Stain   Final    ABUNDANT WBC PRESENT,BOTH PMN AND MONONUCLEAR NO  ORGANISMS SEEN Performed at [facility], [place] [address] [place], [place] [address]    Culture FEW GRAM  NEGATIVE RODS  Final   Report Status PENDING  Incomplete         Radiology Studies: VAS [place] UPPER EXTREMITY VENOUS DUPLEX  Result Date: 11/10/2021 UPPER VENOUS STUDY  Patient Name:  Glenn [name]  Date of Exam:   11/10/2021 Medical Rec #: [id number]       Accession #:    [id number] Date of Birth: January 01, 1941       Patient Gender: M Patient Age:   12 years Exam Location:  [facility] Procedure:      VAS [place] UPPER EXTREMITY VENOUS DUPLEX Referring Phys: Barb Merino --------------------------------------------------------------------------------  Indications: Swelling Other Indications: Suspect PICC line associated DVT. Limitations: Bandages. Comparison Study: No previous study to compare. Performing Technologist: Bobetta Lime  Examination Guidelines: A complete evaluation includes B-mode imaging, spectral Doppler, color Doppler, and power Doppler as needed of all accessible portions of each vessel. Bilateral testing is considered an integral part of a complete examination. Limited examinations for reoccurring indications may be performed as noted.  Right Findings: +----------+------------+---------+-----------+----------+-------+ RIGHT     CompressiblePhasicitySpontaneousPropertiesSummary +----------+------------+---------+-----------+----------+-------+ Subclavian    Full       Yes       Yes                      +----------+------------+---------+-----------+----------+-------+ Axillary      Full                                          +----------+------------+---------+-----------+----------+-------+ Brachial      Full                                          +----------+------------+---------+-----------+----------+-------+ Radial        Full                                          +----------+------------+---------+-----------+----------+-------+ Ulnar         Full                                           +----------+------------+---------+-----------+----------+-------+ Cephalic      Full                                          +----------+------------+---------+-----------+----------+-------+ Basilic       Full                                          +----------+------------+---------+-----------+----------+-------+ Unable to visualize the right internal jugular vein due to large bandage on the right side of the neck.  Left Findings: +----------+------------+---------+-----------+----------+-------+ LEFT      CompressiblePhasicitySpontaneousPropertiesSummary +----------+------------+---------+-----------+----------+-------+ Subclavian    Full       Yes       Yes                      +----------+------------+---------+-----------+----------+-------+  Summary:  Right: No evidence of deep vein thrombosis in the upper extremity. No evidence of superficial vein thrombosis in the upper extremity.  Left: No evidence of thrombosis in the subclavian.  *See table(s) above for measurements and observations.    Preliminary    IR LUMBAR DISC ASPIRATION W/IMG GUIDE  Result Date: 11/09/2021 INDICATION: Low back pain, MR suggestive of discitis L2-3 EXAM: LUMBAR L2-3 DISC ASPIRATION UNDER FLUOROSCOPY MEDICATIONS: Lidocaine 1% subcutaneous ANESTHESIA/SEDATION: Intravenous Fentanyl 38mcg and Versed 1.5mg  were administered as conscious sedation during continuous monitoring of the patient's level of consciousness and physiological / cardiorespiratory status by the radiology RN, with a total moderate sedation time of 11 minutes. PROCEDURE: Informed written consent was obtained from the patient after a thorough discussion of the procedural risks, benefits and alternatives. All questions were addressed. Maximal Sterile Barrier Technique was utilized including caps, mask, sterile gowns, sterile gloves, sterile drape, hand hygiene and skin antiseptic. A timeout was performed prior to the initiation of the  procedure. The appropriate interspace was identified under fluoroscopy, corresponding to previous cross-sectional imaging. An appropriate skin entry site was determined. After local infiltration with 1% lidocaine, AN 18 gauge trocar needle was advanced into the interspace from RIGHT posterolateral extraforaminal approach. Needle tip position within the interspace confirmed on biplane images. Approximately 3.5 mL thin blood-tinged fluid were aspirated, sent for the requested laboratory studies. The patient tolerated the procedure well. FLUOROSCOPY TIME:  Radiation Exposure Index (as provided by the fluoroscopic device): 14 mGy air Kerma COMPLICATIONS: None immediate. IMPRESSION: Technically successful lumbar L2-3 disc aspiration under fluoroscopy. Electronically Signed   By: Lucrezia Europe M.D.   On: 11/09/2021 15:52        Scheduled Meds:  apixaban  5 mg Oral BID   artificial tears   Both Eyes QHS   atorvastatin  80 mg Oral QHS   carvedilol  6.25 mg Oral BID WC   chlorhexidine  15 mL Mouth Rinse BID   Chlorhexidine Gluconate Cloth  6 each Topical Q0600   finasteride  5 mg Oral Daily   insulin aspart  0-15 Units Subcutaneous TID WC   insulin aspart  0-5 Units Subcutaneous QHS   insulin detemir  10 Units Subcutaneous Daily   ketotifen  1 drop Both Eyes BID   latanoprost  1 drop Both Eyes QHS   mouth rinse  15 mL Mouth Rinse q12n4p   multivitamin with minerals  1 tablet Oral Daily   pantoprazole  40 mg Oral Q1200   polyethylene glycol  17 g Oral Daily   sodium chloride flush  10-40 mL Intracatheter Q12H   sodium chloride flush  10-40 mL Intracatheter Q12H   tamsulosin  0.8 mg Oral QHS   torsemide  20 mg Oral Daily   Continuous Infusions:  sodium chloride 250 mL (11/02/21 0205)   sodium chloride Stopped (11/04/21 0512)    ceFAZolin (ANCEF) IV 2 g (11/10/21 0536)     LOS: 9 days    Time spent: 35 minutes    Barb Merino, MD [facility] Pager [phone]

## 2021-11-10 NOTE — Progress Notes (Addendum)
Inpatient Rehab Admissions Coordinator:   Met with patient at bedside to review CIR recommendations and goals/expectations of rehab stay.  I reviewed 3 hrs/day of therapy, physician following, and average length of stay 2 weeks with goals of supervision.  Pt states he lives at home with his wife, who is disabled.  Currently their daughter is providing care for her, but she is a Pharmacist, hospital.  I've left a message with her [name]) to discuss available support for patient at discharge.  I reviewed need for insurance auth with [facility] and this request is pending.  I will continue to follow.   Addendum: I was able to speak to pt's daughter and was able to confirm 24/7 caregiver support at discharge.  Will continue to follow.   Shann Medal, PT, DPT Admissions Coordinator [phone] 11/10/21  2:12 PM

## 2021-11-10 NOTE — TOC Progression Note (Signed)
Transition of Care [facility]) - Initial/Assessment Note    Patient Details  Name: Glenn [name] MRN: [id number] Date of Birth: [birth year]  Transition of [facility]) CM/SW Contact:    Milinda Antis, [place] Phone Number: 11/10/2021, 11:01 AM  Clinical Narrative:                 CSW contacted Pamala Hurry with CIR to inquire about when the patient can be accepted and is awaiting a response.  Expected Discharge Plan: Home/Self Care Barriers to Discharge: Continued Medical Work up   Patient Goals and CMS Choice Patient states their goals for this hospitalization and ongoing recovery are:: Wants to get better to go home CMS Medicare.gov Compare Post Acute Care list provided to::  (n/a) Choice offered to / list presented to : NA  Expected Discharge Plan and Services Expected Discharge Plan: Home/Self Care In-house Referral: NA Discharge Planning Services: CM Consult Post Acute Care Choice: NA Living arrangements for the past 2 months: Single Family Home                 DME Arranged: N/A DME Agency: NA       HH Arranged: NA HH Agency: NA        Prior Living Arrangements/Services Living arrangements for the past 2 months: Single Family Home Lives with:: Spouse, Adult Children Patient language and need for interpreter reviewed:: Yes Do you feel safe going back to the place where you live?: Yes      Need for Family Participation in Patient Care: Yes (Comment) Care giver support system in place?: Yes (comment) Current home services: DME (wheelchair cane walker per patient) Criminal Activity/Legal Involvement Pertinent to Current Situation/Hospitalization: No - Comment as needed  Activities of Daily Living      Permission Sought/Granted Permission sought to share information with : Family Supports Permission granted to share information with : No              Emotional Assessment Appearance:: Appears stated age Attitude/Demeanor/Rapport: Gracious, Engaged Affect (typically  observed): Accepting, Pleasant Orientation: : Oriented to Self, Oriented to Place, Oriented to  Time, Oriented to Situation Alcohol / Substance Use: Not Applicable Psych Involvement: No (comment)  Admission diagnosis:  Septic shock ([place]) [A41.9, R65.21] Patient Active Problem List   Diagnosis Date Noted   Foot ulcer ([place]) 11/06/2021   Discitis 11/05/2021   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[place]) 11/05/2021   Chronic kidney disease, stage 3b ([place]) 11/05/2021   Coronary artery disease involving native coronary artery of native heart without angina pectoris 11/05/2021   Chronic combined systolic and diastolic CHF (congestive heart failure) ([place]) 11/05/2021   Hyponatremia 11/05/2021   Hypokalemia 11/05/2021   Transaminitis 11/05/2021   Myocardial injury 11/05/2021   Thrombocytopenia ([place]) 11/05/2021   Anemia due to chronic kidney disease 11/05/2021   Coagulopathy ([place]) 11/05/2021   Protein-calorie malnutrition, moderate ([place]) 11/05/2021   Paroxysmal atrial fibrillation ([place]) 11/05/2021   Septic shock ([place]) 11/02/2021   Bacteremia due to Klebsiella pneumoniae 11/02/2021   Acute kidney injury ([place]) 11/02/2021   Bladder outlet obstruction 11/02/2021   Uncontrolled type 2 diabetes mellitus with hyperglycemia, with long-term current use of insulin ([place]) 11/02/2021   Pacemaker 11/02/2021   PCP:  System, Provider Not In Pharmacy:   [facility] [address] [place] [address] Phone: [phone] Fax: [place], [place] [address] Phone:  [phone] Fax: [phone]     Social Determinants of Health (SDOH) Interventions    Readmission Risk Interventions    11/06/2021   12:28 PM  Readmission Risk Prevention Plan  Transportation Screening Complete  PCP or Specialist Appt within 3-5 Days Complete  HRI or [place] Complete  Social Work  Consult for [place] Planning/Counseling Complete  Palliative Care Screening Not Applicable  Medication Review Press photographer) Referral to Pharmacy

## 2021-11-10 NOTE — Progress Notes (Signed)
Upper extremity venous duplex study completed. Please see CV Proc for preliminary results.  Billie Intriago BS, RVT 11/10/2021 12:18 PM

## 2021-11-10 NOTE — Progress Notes (Incomplete)
PMR Admission Coordinator Pre-Admission Assessment  Patient: Glenn [name] is an 81 y.o., male MRN: [id number] DOB: [birth year] Height: '5\' 10"'  (177.8 cm) Weight: 64.9 kg  Insurance Information HMO:     PPO:      PCP:      IPA:      80/20:      OTHER:  PRIMARY: [place]      Policy#: [id number]      Subscriber: pt CM Name: ***      Phone#: ***     Fax#: *** Pre-Cert#: [id number] pending approval/admission      Employer: pt Benefits:  Phone #: [phone]     Name:  Eff. Date: 10/27/17     Deduct: $0      Out of Pocket Max: $0      Life Max: n/a CIR: 100%      SNF: 100$ Outpatient: 100$     Co-Pay:  Home Health: 100$      Co-Pay:  DME: 100%     Co-Pay:  Providers:  SECONDARY: n/a      Policy#: n/a     Phone#:   Financial Counselor:       Phone#:   The "Data Collection Information Summary" for patients in Inpatient Rehabilitation Facilities with attached "Privacy Act [place] Records" was provided and verbally reviewed with: Patient  Emergency Contact Information Contact Information     Name Relation Home Work Mobile   [place] Daughter [phone]  [phone]       Current Medical History  Patient Admitting Diagnosis: debility, discitis   History of Present Illness: Pt is a 81 y/o male with PMH of DM, HTN, MI, CAD s/p CABG, recent PPM and DCCV for afib, CHF, and CKD admitted to [facility] on 10/29/2021 with c/o generalized weakness and falls over the previous 3 days.  In ED he was hypotensive ultimately requiring vasopressor support, Cr 3.62, Na 129.  Workup revealed UTI and pt felt to be in septic shock, also noted leukocytosis 24.7, AKI, and MRI confirmed osteomyelitis/discitis, and transaminitis.  Pt underwent IR guided aspiration of discitis at L2-3 level, cultures pending.  Blood cultures showing bacteremia due to klebsiella PNA, currently on ancef x6 weeks.  Hospital course complicated by development of psoas abscess, urinary retention, and AKI.   Therapy evaluations completed and pt was recommended for CIR.    [facility]'s [facility] record from [facility] has been reviewed by the rehabilitation admission coordinator and physician.  Past Medical History  Past Medical History:  Diagnosis Date   Diabetes mellitus without complication ([place])    Exposure to Agent Orange    Hypertension    MI (myocardial infarction) ([place])    Renal disorder    kidney stones    Has the patient had major surgery during 100 days prior to admission? Yes  Family History   family history is not on file.  Current Medications  Current Facility-Administered Medications:    0.9 %  sodium chloride infusion, 250 mL, Intravenous, Continuous, Anders Simmonds, MD, Last Rate: 10 mL/hr at 11/02/21 0205, 250 mL at 11/02/21 0205   0.9 %  sodium chloride infusion, 250 mL, Intravenous, Continuous, Oletta Darter Virgina Evener, MD, Stopped at 11/04/21 4268   apixaban (ELIQUIS) tablet 5 mg, 5 mg, Oral, BID, Barb Merino, MD   artificial tears (LACRILUBE) ophthalmic ointment, , Both Eyes, QHS, Jennelle Human B, NP, Given at 11/09/21 2256   atorvastatin (LIPITOR) tablet 80 mg, 80 mg, Oral, QHS, Danford, Harrell Gave  P, MD, 80 mg at 11/09/21 2239   carvedilol (COREG) tablet 6.25 mg, 6.25 mg, Oral, BID WC, Danford, Suann Larry, MD, 6.25 mg at 11/10/21 0711   ceFAZolin (ANCEF) IVPB 2g/100 mL premix, 2 g, Intravenous, Q8H, Laurice Record, MD, Last Rate: 200 mL/hr at 11/10/21 1410, 2 g at 11/10/21 1410   chlorhexidine (PERIDEX) 0.12 % solution 15 mL, 15 mL, Mouth Rinse, BID, Maryjane Hurter, MD, 15 mL at 11/10/21 0933   Chlorhexidine Gluconate Cloth 2 % PADS 6 each, 6 each, Topical, Q0600, Spero Geralds, MD, 6 each at 11/10/21 0703   finasteride (PROSCAR) tablet 5 mg, 5 mg, Oral, Daily, Jennelle Human B, NP, 5 mg at 11/10/21 0933   HYDROcodone-acetaminophen (NORCO/VICODIN) 5-325 MG per tablet 1-2 tablet, 1-2 tablet, Oral, Q4H PRN, Arne [place], MD, 1 tablet at 11/10/21 0538    insulin aspart (novoLOG) injection 0-15 Units, 0-15 Units, Subcutaneous, TID WC, Danford, Suann Larry, MD, 5 Units at 11/10/21 1258   insulin aspart (novoLOG) injection 0-5 Units, 0-5 Units, Subcutaneous, QHS, Danford, Suann Larry, MD, 3 Units at 11/08/21 2059   insulin detemir (LEVEMIR) injection 10 Units, 10 Units, Subcutaneous, Daily, Barb Merino, MD, 10 Units at 11/10/21 0933   ketotifen (ZADITOR) 0.025 % ophthalmic solution 1 drop, 1 drop, Both Eyes, BID, Simpson, Paula B, NP, 1 drop at 11/10/21 0935   latanoprost (XALATAN) 0.005 % ophthalmic solution 1 drop, 1 drop, Both Eyes, QHS, Simpson, Paula B, NP, 1 drop at 11/09/21 2241   MEDLINE mouth rinse, 15 mL, Mouth Rinse, q12n4p, Maryjane Hurter, MD, 15 mL at 11/10/21 1301   multivitamin with minerals tablet 1 tablet, 1 tablet, Oral, Daily, Danford, Suann Larry, MD, 1 tablet at 11/10/21 3329   oxyCODONE (Oxy IR/ROXICODONE) immediate release tablet 5 mg, 5 mg, Oral, Q8H PRN, Jennelle Human B, NP, 5 mg at 11/10/21 0939   pantoprazole (PROTONIX) EC tablet 40 mg, 40 mg, Oral, Q1200, Jennelle Human B, NP, 40 mg at 11/10/21 1258   polyethylene glycol (MIRALAX / GLYCOLAX) packet 17 g, 17 g, Oral, Daily, Ghimire, Dante Gang, MD, 17 g at 11/10/21 1258   polyvinyl alcohol (LIQUIFILM TEARS) 1.4 % ophthalmic solution 1 drop, 1 drop, Both Eyes, TID PRN, Jennelle Human B, NP, 1 drop at 11/09/21 2241   sodium chloride flush (NS) 0.9 % injection 10-40 mL, 10-40 mL, Intracatheter, Q12H, Clark, Laura P, DO, 10 mL at 11/09/21 0931   sodium chloride flush (NS) 0.9 % injection 10-40 mL, 10-40 mL, Intracatheter, PRN, Noemi Chapel P, DO   sodium chloride flush (NS) 0.9 % injection 10-40 mL, 10-40 mL, Intracatheter, Q12H, Maryjane Hurter, MD, 10 mL at 11/10/21 0935   sodium chloride flush (NS) 0.9 % injection 10-40 mL, 10-40 mL, Intracatheter, PRN, Maryjane Hurter, MD   tamsulosin [facility]) capsule 0.8 mg, 0.8 mg, Oral, QHS, Danford, Christopher P, MD, 0.8 mg  at 11/09/21 2239   torsemide (DEMADEX) tablet 20 mg, 20 mg, Oral, Daily, Danford, Suann Larry, MD, 20 mg at 11/10/21 5188  Patients Current Diet:  Diet Order             Diet Carb Modified Fluid consistency: Thin; Room service appropriate? Yes  Diet effective now                   Precautions / Restrictions Precautions Precautions: Fall Restrictions Weight Bearing Restrictions: No   Has the patient had 2 or more falls or a fall with injury in the past year? Yes  Prior Activity Level Limited Community (1-2x/wk): reports no DME prior to admit, mobility and ADLs mod I  Prior Functional Level Self Care: Did the patient need help bathing, dressing, using the toilet or eating? Independent  Indoor Mobility: Did the patient need assistance with walking from room to room (with or without device)? Independent  Stairs: Did the patient need assistance with internal or external stairs (with or without device)? Independent  Functional Cognition: Did the patient need help planning regular tasks such as shopping or remembering to take medications? Independent  Patient Information Are you of Hispanic, Latino/a,or Spanish origin?: A. No, not of Hispanic, Latino/a, or Spanish origin  Patient's Response To:     Home Assistive Devices / Equipment Home Equipment: Conservation officer, nature (2 wheels), Wheelchair - manual, [place] - single point  Prior Device Use: Indicate devices/aids used by the patient prior to current illness, exacerbation or injury? None of the above  Current Functional Level Cognition  Overall Cognitive Status: Within Functional Limits for tasks assessed Orientation Level: Oriented X4 General Comments: educated in importance of activity to regain strength, pt stating he "just needed to eat and rest to regain his strength"    Extremity Assessment (includes Sensation/Coordination)  Upper Extremity Assessment: Generalized weakness  Lower Extremity Assessment: Defer to PT  evaluation    ADLs  Overall ADL's : Needs assistance/impaired Eating/Feeding: Set up, Sitting Grooming: Wash/dry face, Oral care, Bed level, Set up Upper Body Bathing: Minimal assistance, Sitting, Set up, Min guard Lower Body Bathing: Minimal assistance, Moderate assistance, Sit to/from stand Lower Body Bathing Details (indicate cue type and reason): assist for balance and task managment Upper Body Dressing : Set up, Minimal assistance, Sitting Lower Body Dressing: Minimal assistance, Moderate assistance, Sit to/from stand Lower Body Dressing Details (indicate cue type and reason): assist for balance and task managment Toilet Transfer: Minimal assistance, Stand-pivot, Rolling walker (2 wheels) Toileting- Clothing Manipulation and Hygiene: Minimal assistance, Moderate assistance, Sit to/from stand General ADL Comments: needs supported sitting for dynamic tasks. Unsteadiness noted in standing, posterior lean, requires assist to stabilize rw in standing. Limited to pivotal steps to recliner, uncontrolled descent to chair.    Mobility  Overal bed mobility: Needs Assistance Bed Mobility: Rolling, Sidelying to Sit, Sit to Sidelying Rolling: Min guard Sidelying to sit: Min guard Sit to sidelying: Min assist General bed mobility comments: min guard to reach EOB but minA to bring LEs back into bed    Transfers  Overall transfer level: Needs assistance Equipment used: Rolling walker (2 wheels) Transfers: Sit to/from Stand Sit to Stand: Mod assist Bed to/from chair/wheelchair/BSC transfer type:: Stand pivot Stand pivot transfers: Min assist, From elevated surface General transfer comment: cues for hand placement. ModA to stand from low and elevated surface with posterior bias initially    Ambulation / Gait / Stairs / Wheelchair Mobility  Ambulation/Gait Ambulation/Gait assistance: Herbalist (Feet): 30 Feet Assistive device: Rolling walker (2 wheels) Gait Pattern/deviations:  Step-to pattern, Shuffle, Decreased stride length, Trunk flexed, Narrow base of support General Gait Details: Slowed gait speed. Cues for close RW proximity. MinA for balance and safety especially with turning Gait velocity: decreased Gait velocity interpretation: <1.31 ft/sec, indicative of household ambulator    Posture / Balance Dynamic Sitting Balance Sitting balance - Comments: required min guard for safety Balance Overall balance assessment: Needs assistance Sitting-balance support: No upper extremity supported, Feet supported Sitting balance-Leahy Scale: Fair Sitting balance - Comments: required min guard for safety Postural control: Posterior lean Standing balance  support: Reliant on assistive device for balance, Bilateral upper extremity supported Standing balance-Leahy Scale: Poor Standing balance comment: reliant on RW for support    Special needs/care consideration Continuous Drip IV  ancef and Diabetic management yes   Previous Home Environment (from acute therapy documentation) Living Arrangements: Children, Spouse/significant other (wife is disabled, unable to help him at home. daughter is a Pharmacist, hospital and able to help in the evenings and on weekends) Available Help at Discharge: Family Type of Home: House Home Layout: One level Home Access: [place] entrance  Discharge Living Setting Plans for Discharge Living Setting: Patient's home, Lives with (comment) (spouse who is disabled) Type of Home at Discharge: House Discharge Home Layout: One level Discharge Home Access: Ramped entrance  [place] Anticipated Caregiver: Katheren Puller (dtr) Anticipated Caregiver's Contact Information: [phone] Ability/Limitations of Caregiver: works as a Pharmacist, hospital, per pt is off for the summer and currently providing care for his spouse while he is hospitalized Caregiver Availability: 24/7 Discharge Plan Discussed with Primary Caregiver: Yes Is Caregiver In  Agreement with Plan?: Yes Does Caregiver/Family have Issues with Lodging/Transportation while Pt is in Rehab?: No  Goals Patient/Family Goal for Rehab: PT/OT supervision to mod I, SLP n/a Expected length of stay: 7-10 days Additional Information: per pt, spouse is disabled and w/c level, requires care at home which daughter is currently providing. Pt/Family Agrees to Admission and willing to participate: Yes Program Orientation Provided & Reviewed with Pt/Caregiver Including Roles  & Responsibilities: Yes  Barriers to Discharge: Decreased caregiver support, Insurance for SNF coverage, IV antibiotics  Decrease burden of Care through IP rehab admission: n/a  Possible need for SNF placement upon discharge: Not anticipated.  Expect pt to reach supervision to intermittent mod I level with rehab stay.  If he does not progress as expected, and daughter is unable to provide care for both he and his wife, he may need to look into further rehab at [facility], but this is not our goal.   Patient Condition: I have reviewed medical records from [facility], spoken with [facility] team, and patient. I met with patient at the bedside for inpatient rehabilitation assessment.  Patient will benefit from ongoing PT and OT, can actively participate in 3 hours of therapy a day 5 days of the week, and can make measurable gains during the admission.  Patient will also benefit from the coordinated team approach during an Inpatient Acute Rehabilitation admission.  The patient will receive intensive therapy as well as Rehabilitation physician, nursing, social worker, and care management interventions.  Due to safety, disease management, medication administration, pain management, and patient education the patient requires 24 hour a day rehabilitation nursing.  The patient is currently min to mod assist with mobility and basic ADLs.  Discharge setting and therapy post discharge at home is anticipated.  Patient has agreed to participate in the  Acute Inpatient Rehabilitation Program and will admit pending insurance approval ***.  Preadmission Screen Completed By:  Michel Santee, PT, DPT 11/10/2021 2:26 PM

## 2021-11-10 NOTE — Progress Notes (Signed)
PHARMACY CONSULT NOTE FOR:  OUTPATIENT  PARENTERAL ANTIBIOTIC THERAPY (OPAT)  Indication: Kleb PNA osteo/discitis Regimen: Cefazolin 2g IV every 8 hours End date: 12/19/21  IV antibiotic discharge orders are pended. To discharging provider:  please sign these orders via discharge navigator,  Select New Orders & click on the button choice - Manage This Unsigned Work.     Thank you for allowing pharmacy to be a part of this patient's care.  Georgina Pillion, PharmD, BCPS Infectious Diseases Clinical Pharmacist 11/10/2021 4:06 PM   **Pharmacist phone directory can now be found on [email or web address] ([email or web address]).  Listed under [facility].

## 2021-11-10 NOTE — Progress Notes (Signed)
[facility]    Date of Admission:  10/29/2021   Total days of antibiotics 10          ID: Glenn [name] is a 81 y.o. male with klebsiella lumbar discitis with bacteremia Principal Problem:   Septic shock ([place]) Active Problems:   Bacteremia due to Klebsiella pneumoniae   Acute kidney injury ([place])   Bladder outlet obstruction   Uncontrolled type 2 diabetes mellitus with hyperglycemia, with long-term current use of insulin (HCC)   Pacemaker   Discitis   Osteomyelitis of lumbar spine (HCC)   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HCC)   Chronic kidney disease, stage 3b (HCC)   Coronary artery disease involving native coronary artery of native heart without angina pectoris   Chronic combined systolic and diastolic CHF (congestive heart failure) (HCC)   Hyponatremia   Hypokalemia   Transaminitis   Myocardial injury   Thrombocytopenia (HCC)   Anemia due to chronic kidney disease   Coagulopathy (HCC)   Protein-calorie malnutrition, moderate (HCC)   Paroxysmal atrial fibrillation (HCC)   Foot ulcer (HCC)    Subjective: Underwent imaging of right arm which is negative for DVT  Aspirate cx showing GNR from 6/8  Medications:   apixaban  5 mg Oral BID   artificial tears   Both Eyes QHS   atorvastatin  80 mg Oral QHS   carvedilol  6.25 mg Oral BID WC   chlorhexidine  15 mL Mouth Rinse BID   Chlorhexidine Gluconate Cloth  6 each Topical Q0600   finasteride  5 mg Oral Daily   insulin aspart  0-15 Units Subcutaneous TID WC   insulin aspart  0-5 Units Subcutaneous QHS   insulin detemir  10 Units Subcutaneous Daily   ketotifen  1 drop Both Eyes BID   latanoprost  1 drop Both Eyes QHS   mouth rinse  15 mL Mouth Rinse q12n4p   multivitamin with minerals  1 tablet Oral Daily   pantoprazole  40 mg Oral Q1200   polyethylene glycol  17 g Oral Daily   sodium chloride flush  10-40 mL Intracatheter Q12H   sodium chloride flush  10-40 mL Intracatheter Q12H   tamsulosin  0.8 mg Oral  QHS   torsemide  20 mg Oral Daily    Objective: Vital signs in last 24 hours: Temp:  [97.9 F (36.6 C)-98.7 F (37.1 C)] 98.7 F (37.1 C) (06/09 0919) Pulse Rate:  [79-98] 81 (06/09 0920) Resp:  [16-18] 18 (06/09 0919) BP: (107-129)/(53-72) 107/53 (06/09 0920) SpO2:  [98 %-100 %] 99 % (06/09 0919)  Physical Exam  Constitutional: He is oriented to person, place, and time. He appears well-developed and well-nourished. No distress.  HENT:  Mouth/Throat: Oropharynx is clear and moist. No oropharyngeal exudate.  Cardiovascular: Normal rate, regular rhythm and normal heart sounds. Exam reveals no gallop and no friction rub.  No murmur heard.  Pulmonary/Chest: Effort normal and breath sounds normal. No respiratory distress. He has no wheezes.  YOF:VWAQL arm swelling with picc in place Psychiatric: He has a normal mood and affect. His behavior is normal.    Lab Results Recent Labs    11/08/21 0453  WBC 14.3*  HGB 10.5*  HCT 31.0*  NA 133*  K 4.1  CL 101  CO2 22  BUN 62*  CREATININE 1.70*   Liver Panel Recent Labs    11/08/21 0453  PROT 5.9*  ALBUMIN 1.8*  AST 23  ALT 6  ALKPHOS 226*  BILITOT 0.9  No results found for: "ESRSEDRATE", "POCTSEDRATE"  Microbiology: KLEBSIELLA PNEUMONIAE Abnormal     Report Status 11/04/2021 FINAL   Organism ID, Bacteria KLEBSIELLA PNEUMONIAE   Resulting Agency [facility]     Susceptibility   Klebsiella pneumoniae    MIC    AMPICILLIN RESISTANT  Resistant    AMPICILLIN/SULBACTAM 4 SENSITIVE  Sensitive    CEFAZOLIN <=4 SENSITIVE  Sensitive    CEFEPIME <=0.12 SENS... Sensitive    CEFTAZIDIME <=1 SENSITIVE  Sensitive    CEFTRIAXONE <=0.25 SENS... Sensitive    CIPROFLOXACIN <=0.25 SENS... Sensitive    GENTAMICIN <=1 SENSITIVE  Sensitive    IMIPENEM <=0.25 SENS... Sensitive    PIP/TAZO 8 SENSITIVE  Sensitive    TRIMETH/SULFA <=20 SENSIT... Sensitive          Studies/Results: VAS [place] UPPER EXTREMITY VENOUS DUPLEX  Result  Date: 11/10/2021 UPPER VENOUS STUDY  Patient Name:  Glenn [name]  Date of Exam:   11/10/2021 Medical Rec #: [id number]       Accession #:    [id number] Date of Birth: 12-Dec-1940       Patient Gender: M Patient Age:   7 years Exam Location:  [facility] Procedure:      VAS [place] UPPER EXTREMITY VENOUS DUPLEX Referring Phys: Barb Merino --------------------------------------------------------------------------------  Indications: Swelling Other Indications: Suspect PICC line associated DVT. Limitations: Bandages. Comparison Study: No previous study to compare. Performing Technologist: Bobetta Lime  Examination Guidelines: A complete evaluation includes B-mode imaging, spectral Doppler, color Doppler, and power Doppler as needed of all accessible portions of each vessel. Bilateral testing is considered an integral part of a complete examination. Limited examinations for reoccurring indications may be performed as noted.  Right Findings: +----------+------------+---------+-----------+----------+-------+ RIGHT     CompressiblePhasicitySpontaneousPropertiesSummary +----------+------------+---------+-----------+----------+-------+ Subclavian    Full       Yes       Yes                      +----------+------------+---------+-----------+----------+-------+ Axillary      Full                                          +----------+------------+---------+-----------+----------+-------+ Brachial      Full                                          +----------+------------+---------+-----------+----------+-------+ Radial        Full                                          +----------+------------+---------+-----------+----------+-------+ Ulnar         Full                                          +----------+------------+---------+-----------+----------+-------+ Cephalic      Full                                           +----------+------------+---------+-----------+----------+-------+ Basilic  Full                                          +----------+------------+---------+-----------+----------+-------+ Unable to visualize the right internal jugular vein due to large bandage on the right side of the neck.  Left Findings: +----------+------------+---------+-----------+----------+-------+ LEFT      CompressiblePhasicitySpontaneousPropertiesSummary +----------+------------+---------+-----------+----------+-------+ Subclavian    Full       Yes       Yes                      +----------+------------+---------+-----------+----------+-------+  Summary:  Right: No evidence of deep vein thrombosis in the upper extremity. No evidence of superficial vein thrombosis in the upper extremity.  Left: No evidence of thrombosis in the subclavian.  *See table(s) above for measurements and observations.  Diagnosing physician: Servando Snare MD Electronically signed by Servando Snare MD on 11/10/2021 at 2:52:03 PM.    Final    IR LUMBAR [place] W/IMG GUIDE  Result Date: 11/09/2021 INDICATION: Low back pain, MR suggestive of discitis L2-3 EXAM: LUMBAR L2-3 DISC ASPIRATION UNDER FLUOROSCOPY MEDICATIONS: Lidocaine 1% subcutaneous ANESTHESIA/SEDATION: Intravenous Fentanyl 80mg and Versed 1.575mwere administered as conscious sedation during continuous monitoring of the patient's level of consciousness and physiological / cardiorespiratory status by the radiology RN, with a total moderate sedation time of 11 minutes. PROCEDURE: Informed written consent was obtained from the patient after a thorough discussion of the procedural risks, benefits and alternatives. All questions were addressed. Maximal Sterile Barrier Technique was utilized including caps, mask, sterile gowns, sterile gloves, sterile drape, hand hygiene and skin antiseptic. A timeout was performed prior to the initiation of the procedure. The appropriate  interspace was identified under fluoroscopy, corresponding to previous cross-sectional imaging. An appropriate skin entry site was determined. After local infiltration with 1% lidocaine, AN 18 gauge trocar needle was advanced into the interspace from RIGHT posterolateral extraforaminal approach. Needle tip position within the interspace confirmed on biplane images. Approximately 3.5 mL thin blood-tinged fluid were aspirated, sent for the requested laboratory studies. The patient tolerated the procedure well. FLUOROSCOPY TIME:  Radiation Exposure Index (as provided by the fluoroscopic device): 14 mGy air Kerma COMPLICATIONS: None immediate. IMPRESSION: Technically successful lumbar L2-3 disc aspiration under fluoroscopy. Electronically Signed   By: D Lucrezia Europe.D.   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   Alignment:  Physiologic.   Vertebrae: Abnormal fluid within the L2-L3 disc space with surrounding endplate irregularity and marrow edema, consistent with osteomyelitis discitis. Small amount of anterior epidural phlegmon at L2 and L3 without fluid collection. No fracture or suspicious bone lesion.   Conus medullaris and cauda equina: Conus extends to the L1-L2 level. Conus and cauda equina appear normal.   Paraspinal and other soft tissues: Left-greater-than-right psoas muscle edema with tiny foci gas. No drainable fluid collection. Chronically distended, trabeculated bladder again noted.   Disc levels:   T12-L1:  Negative.   L1-L2: Mild disc bulging and bilateral facet arthropathy. No stenosis.   L2-L3: Osteomyelitis discitis. Mild disc bulging and bilateral facet arthropathy. Mild spinal canal and bilateral neuroforaminal stenosis.   L3-L4: Mild disc bulging and bilateral facet arthropathy. Mild bilateral neuroforaminal stenosis. No spinal canal stenosis.   L4-L5: Mild disc bulging  with small left paracentral annular fissure. Mild bilateral facet arthropathy. Mild right  neuroforaminal stenosis. No spinal canal or left neuroforaminal stenosis.   L5-S1: No significant disc bulge or herniation. Bridging foraminal endplate osteophytes. No stenosis.   IMPRESSION: 1. Osteomyelitis discitis at L2-L3 with small anterior epidural phlegmon. No discrete abscess. 2. Left-greater-than-right psoas muscle edema with tiny foci of gas. No drainable fluid collection. 3. Mild multilevel lumbar spondylosis as described above.    Assessment/Plan: L2-L3 discitis with anterior epidural phlegmon and psoas m abscess = continue on cefazolin for plan of 6 wk. Aspirate cx on 6/8 has also isolated GNR.  Will check sed rate and crp for tomorrow labs  Right arm swelling = concern related to picc line, dvt has been ruled out. Agree with dr ghimire's plan to switch out picc line to other arm  Leukocytosis =  trended down but most recently static at 14K, possible still elevated due to infection burden.   Will place opat orders and arrange for follow up in the ID clinic in 4-6 wk  Diagnosis: Discitis/osteo with epidural phlegmon and bacteremia  Culture Result: klebsiella pneumonaie  Allergies  Allergen Reactions   Heparin Other (See Comments)    Causes "head to feel hot"    OPAT Orders Discharge antibiotics to be given via PICC line Discharge antibiotics: Per pharmacy protocol cefazolin 2gm IV Q 8hr  Duration: 6 wk End Date: 12/19/2021  [facility] Care Per Protocol:  Home health RN for IV administration and teaching; PICC line care and labs.    Labs weekly while on IV antibiotics: _x_ CBC with differential _x_ BMP __ CMP __ CRP __x ESR   __x Please pull PIC at completion of IV antibiotics   Fax weekly labs to [phone]  Clinic Follow Up Appt:  HOSPITAL FU 30 at 9:00 AM (30 min) Friday December 15, 2021 Appointment Provider:Calone, Belenda Cruise  @ [email or web address] Pager: [phone]  11/10/2021, 3:56 PM

## 2021-11-10 NOTE — PMR Pre-admission (Signed)
PMR Admission Coordinator Pre-Admission Assessment  Patient: Glenn [name] is an 81 y.o., male MRN: [id number] DOB: 08/17/1940 Height: 5\' 10"  (177.8 cm) Weight: 64.9 kg  Insurance Information HMO:     PPO:      PCP:      IPA:      80/20:      OTHER:  PRIMARY: [facility]      Policy#: [id number]      Subscriber: pt CM Name: Glenn [name]      Phone#: [phone]. [phone]     Fax#: [phone] Pre-Cert#: [id number] Berkley Harvey for CIR from [name] with [name], with updates due to her via email or fax listed above on 12/29/2021    Employer: pt Benefits:  Phone #: [phone]     Name:  Glenn [name]. Date: 10/27/17     Deduct: $0      Out of Pocket Max: $0      Life Max: n/a CIR: 100%      SNF: 100$ Outpatient: 100$     Co-Pay:  Home Health: 100$      Co-Pay:  DME: 100%     Co-Pay:  Providers:  SECONDARY: Medicare Part A      Policy#: [id number]    Phone#:   Artist:       Phone#:   The "Data Collection Information Summary" for patients in Inpatient Rehabilitation Facilities with attached "Privacy Act Statement-Health Care Records" was provided and verbally reviewed with: Patient  Emergency Contact Information Contact Information     Name Relation Home Work Mobile   [place] Daughter [phone]  [phone]       Current Medical History  Patient Admitting Diagnosis: debility, discitis   History of Present Illness: Pt is a 81 y/o male with PMH of DM, HTN, MI, CAD s/p CABG, recent PPM and DCCV for afib, CHF, and CKD admitted to [name] on 2021/11/12 with c/o generalized weakness and falls over the previous 3 days.  In ED he was hypotensive ultimately requiring vasopressor support, Cr 3.62, Na 129.  Workup revealed UTI and pt felt to be in septic shock, with noted leukocytosis 24.7, AKI, and MRI confirmed osteomyelitis/discitis, and transaminitis.  Pt underwent IR guided aspiration of discitis at L2-3 level, cultures consistent with Klebsiella.  Recommendations  from ID for IV cefazolin through 7/18.  Blood cultures showing bacteremia due to klebsiella PNA, currently on ancef x6 weeks.  [name] course complicated by development of psoas abscess, urinary retention, and AKI.  Therapy evaluations completed and pt was recommended for CIR.    Patient's medical record from [facility] has been reviewed by the rehabilitation admission coordinator and physician.  Past Medical History  Past Medical History:  Diagnosis Date   Diabetes mellitus without complication (HCC)    Exposure to Agent Orange    Hypertension    MI (myocardial infarction) (HCC)    Renal disorder    kidney stones    Has the patient had major surgery during 100 days prior to admission? Yes  Family History   family history is not on file.  Current Medications  Current Facility-Administered Medications:    0.9 %  sodium chloride infusion, 250 mL, Intravenous, Continuous, Karl Ito, MD, Last Rate: 10 mL/hr at 11/02/21 0205, 250 mL at 11/02/21 0205   0.9 %  sodium chloride infusion, 250 mL, Intravenous, Continuous, Karl Ito, MD, Last Rate: 10 mL/hr at 11/12/21 1244, 250 mL at 11/12/21 1244   apixaban (ELIQUIS) tablet 5 mg,  5 mg, Oral, BID, Dorcas Carrow, MD, 5 mg at 11/15/21 1007   artificial tears (LACRILUBE) ophthalmic ointment, , Both Eyes, QHS, Simpson, Paula B, NP, Given at 11/14/21 2213   atorvastatin (LIPITOR) tablet 80 mg, 80 mg, Oral, QHS, Danford, Earl Lites, MD, 80 mg at 11/14/21 2209   carvedilol (COREG) tablet 6.25 mg, 6.25 mg, Oral, BID WC, Danford, Earl Lites, MD, 6.25 mg at 11/15/21 0655   ceFAZolin (ANCEF) IVPB 2g/100 mL premix, 2 g, Intravenous, Q12H, Paytes, Austin A, RPH, Last Rate: 200 mL/hr at 11/14/21 2010, 2 g at 11/14/21 2010   chlorhexidine (PERIDEX) 0.12 % solution 15 mL, 15 mL, Mouth Rinse, BID, Omar Person, MD, 15 mL at 11/15/21 1006   Chlorhexidine Gluconate Cloth 2 % PADS 6 each, 6 each, Topical, Q0600, Charlott Holler, MD, 6  each at 11/15/21 0656   feeding supplement (GLUCERNA SHAKE) (GLUCERNA SHAKE) liquid 237 mL, 237 mL, Oral, TID BM, Dorcas Carrow, MD, 237 mL at 11/15/21 1013   finasteride (PROSCAR) tablet 5 mg, 5 mg, Oral, Daily, Selmer Dominion B, NP, 5 mg at 11/15/21 1006   insulin aspart (novoLOG) injection 0-15 Units, 0-15 Units, Subcutaneous, TID WC, Danford, Earl Lites, MD, 3 Units at 11/15/21 1007   insulin aspart (novoLOG) injection 0-5 Units, 0-5 Units, Subcutaneous, QHS, Danford, Earl Lites, MD, 2 Units at 11/13/21 2125   insulin detemir (LEVEMIR) injection 18 Units, 18 Units, Subcutaneous, Daily, Ghimire, Kuber, MD   ketotifen (ZADITOR) 0.025 % ophthalmic solution 1 drop, 1 drop, Both Eyes, BID, Simpson, Paula B, NP, 1 drop at 11/15/21 1008   latanoprost (XALATAN) 0.005 % ophthalmic solution 1 drop, 1 drop, Both Eyes, QHS, Simpson, Paula B, NP, 1 drop at 11/14/21 2210   MEDLINE mouth rinse, 15 mL, Mouth Rinse, q12n4p, Omar Person, MD, 15 mL at 11/14/21 1232   multivitamin with minerals tablet 1 tablet, 1 tablet, Oral, Daily, Danford, Earl Lites, MD, 1 tablet at 11/15/21 1007   oxyCODONE (Oxy IR/ROXICODONE) immediate release tablet 5 mg, 5 mg, Oral, Q4H PRN, Dorcas Carrow, MD, 5 mg at 11/14/21 2009   pantoprazole (PROTONIX) EC tablet 40 mg, 40 mg, Oral, Q1200, Selmer Dominion B, NP, 40 mg at 11/14/21 1231   polyethylene glycol (MIRALAX / GLYCOLAX) packet 17 g, 17 g, Oral, BID, Dorcas Carrow, MD, 17 g at 11/15/21 1006   polyvinyl alcohol (LIQUIFILM TEARS) 1.4 % ophthalmic solution 1 drop, 1 drop, Both Eyes, TID PRN, Selmer Dominion B, NP, 1 drop at 11/12/21 2223   sodium chloride flush (NS) 0.9 % injection 10-40 mL, 10-40 mL, Intracatheter, Q12H, Clark, Laura P, DO, 10 mL at 11/11/21 1027   sodium chloride flush (NS) 0.9 % injection 10-40 mL, 10-40 mL, Intracatheter, PRN, Karie Fetch P, DO   sodium chloride flush (NS) 0.9 % injection 10-40 mL, 10-40 mL, Intracatheter, Q12H, Omar Person, MD, 10 mL at 11/15/21 1008   sodium chloride flush (NS) 0.9 % injection 10-40 mL, 10-40 mL, Intracatheter, PRN, Omar Person, MD   tamsulosin [name]) capsule 0.8 mg, 0.8 mg, Oral, QHS, Danford, Earl Lites, MD, 0.8 mg at 11/14/21 2209  Patients Current Diet:  Diet Order             Diet Carb Modified Fluid consistency: Thin; Room service appropriate? Yes  Diet effective now                   Precautions / Restrictions Precautions Precautions: Fall Restrictions Weight Bearing Restrictions: No  Has the patient had 2 or more falls or a fall with injury in the past year? Yes  Prior Activity Level Limited Community (1-2x/wk): reports no DME prior to admit, mobility and ADLs mod I  Prior Functional Level Self Care: Did the patient need help bathing, dressing, using the toilet or eating? Independent  Indoor Mobility: Did the patient need assistance with walking from room to room (with or without device)? Independent  Stairs: Did the patient need assistance with internal or external stairs (with or without device)? Independent  Functional Cognition: Did the patient need help planning regular tasks such as shopping or remembering to take medications? Independent  Patient Information Are you of Hispanic, Latino/a,or Spanish origin?: A. No, not of Hispanic, Latino/a, or Spanish origin What is your race?: B. Black or African American, C. American [place] or [place] Native Do you need or want an interpreter to communicate with a doctor or health care staff?: 0. No  Patient's Response To:  Health Literacy and Transportation Is the patient able to respond to health literacy and transportation needs?: Yes Health Literacy - How often do you need to have someone help you when you read instructions, pamphlets, or other written material from your doctor or pharmacy?: Never In the past 12 months, has lack of transportation kept you from medical appointments or from getting  medications?: No In the past 12 months, has lack of transportation kept you from meetings, work, or from getting things needed for daily living?: No  Home Assistive Devices / Equipment Home Equipment: Agricultural consultant (2 wheels), Wheelchair - manual, [place] - single point  Prior Device Use: Indicate devices/aids used by the patient prior to current illness, exacerbation or injury? None of the above  Current Functional Level Cognition  Overall Cognitive Status: Within Functional Limits for tasks assessed Orientation Level: Oriented X4 General Comments: "I'll do whatever you say - I want to get better"    Extremity Assessment (includes Sensation/Coordination)  Upper Extremity Assessment: Generalized weakness  Lower Extremity Assessment: Defer to PT evaluation    ADLs  Overall ADL's : Needs assistance/impaired Eating/Feeding: Set up, Sitting Grooming: Set up, Sitting Grooming Details (indicate cue type and reason): in recliner Upper Body Bathing: Minimal assistance, Sitting, Set up, Min guard Lower Body Bathing: Minimal assistance, Moderate assistance, Sit to/from stand Lower Body Bathing Details (indicate cue type and reason): assist for balance and task managment Upper Body Dressing : Set up, Minimal assistance, Sitting Lower Body Dressing: Minimal assistance, Moderate assistance, Sit to/from stand Lower Body Dressing Details (indicate cue type and reason): assist for balance and task managment Toilet Transfer: Minimal assistance, Stand-pivot, Rolling walker (2 wheels), BSC/3in1 Toilet Transfer Details (indicate cue type and reason): transferred from EOB to [name] Toileting- Clothing Manipulation and Hygiene: Minimal assistance, Moderate assistance, Sit to/from stand Toileting - Clothing Manipulation Details (indicate cue type and reason): patient attempted to clean self seated and required assistance to complete General ADL Comments: difficutly with standing tasks    Mobility  Overal  bed mobility: Needs Assistance Bed Mobility: Rolling, Sidelying to Sit Rolling: Min guard Sidelying to sit: Min guard Sit to sidelying: Min assist General bed mobility comments: increased time and verbal cues    Transfers  Overall transfer level: Needs assistance Equipment used: Rolling walker (2 wheels) Transfers: Sit to/from Stand, Bed to chair/wheelchair/BSC Sit to Stand: Mod assist Bed to/from chair/wheelchair/BSC transfer type:: Step pivot Stand pivot transfers: Min assist, From elevated surface Step pivot transfers: Min assist General transfer comment: stood from  EOB with mod assist and from [facility]    Ambulation / Gait / Stairs / Wheelchair Mobility  Ambulation/Gait Ambulation/Gait assistance: Min assist, +2 safety/equipment (chair follow) Gait Distance (Feet): 22 Feet Assistive device: Rolling walker (2 wheels) Gait Pattern/deviations: Step-to pattern, Shuffle, Decreased stride length, Trunk flexed, Narrow base of support General Gait Details: Slowed gait speed. Cues for close RW proximity. MinA for balance and safety especially with turning Gait velocity: decreased Gait velocity interpretation: <1.31 ft/sec, indicative of household ambulator    Posture / Balance Dynamic Sitting Balance Sitting balance - Comments: required min guard for safety Balance Overall balance assessment: Needs assistance Sitting-balance support: No upper extremity supported, Feet supported Sitting balance-Leahy Scale: Fair Sitting balance - Comments: required min guard for safety Postural control: Posterior lean Standing balance support: Reliant on assistive device for balance, Bilateral upper extremity supported Standing balance-Leahy Scale: Poor Standing balance comment: reliant on RW for support    Special needs/care consideration Continuous Drip IV  ancef and Diabetic management yes   Previous Home Environment (from acute therapy documentation) Living Arrangements: Children,  Spouse/significant other (wife is disabled, unable to help him at home. daughter is a Runner, broadcasting/film/video and able to help in the evenings and on weekends) Available Help at Discharge: Family Type of Home: House Home Layout: One level Home Access: Ramped entrance  Discharge Living Setting Plans for Discharge Living Setting: Patient's home, Lives with (comment) (spouse who is disabled) Type of Home at Discharge: House Discharge Home Layout: One level Discharge Home Access: Ramped entrance Discharge Bathroom Shower/Tub: Tub/shower unit Discharge Bathroom Toilet: Standard Discharge Bathroom Accessibility: Yes How Accessible: Accessible via walker Does the patient have any problems obtaining your medications?: No  Social/Family/Support Systems Patient Roles: Spouse Anticipated Caregiver: Glenn [name] (dtr) Anticipated Caregiver's Contact Information: [phone] Ability/Limitations of Caregiver: works as a Runner, broadcasting/film/video, per pt is off for the summer and currently providing care for his spouse while he is hospitalized Caregiver Availability: 24/7 Discharge Plan Discussed with Primary Caregiver: Yes Is Caregiver In Agreement with Plan?: Yes Does Caregiver/Family have Issues with Lodging/Transportation while Pt is in Rehab?: No  Goals Patient/Family Goal for Rehab: PT/OT supervision to mod I, SLP n/a Expected length of stay: 7-10 days Additional Information: Per daughter, spouse is s/p LE amputation in October, mobilizes supervision/mod I w/c level.  Dtr currently living with pt/spouse to provide assist as needed and can be there 24/7 if recommended Pt/Family Agrees to Admission and willing to participate: Yes Program Orientation Provided & Reviewed with Pt/Caregiver Including Roles  & Responsibilities: Yes  Barriers to Discharge: IV antibiotics, Insurance for SNF coverage  Decrease burden of Care through IP rehab admission: n/a  Possible need for SNF placement upon discharge: Not anticipated.   Expect pt to reach supervision to intermittent mod I level with rehab stay.  If he does not progress as expected, and daughter is unable to provide care for both he and his wife, he may need to look into further rehab at [facility], but this is not our goal.   Patient Condition: I have reviewed medical records from [name], spoken with  Glenn [name] team , and patient. I met with patient at the bedside for inpatient rehabilitation assessment.  Patient will benefit from ongoing PT and OT, can actively participate in 3 hours of therapy a day 5 days of the week, and can make measurable gains during the admission.  Patient will also benefit from the coordinated team approach during an Inpatient Acute Rehabilitation admission.  The patient will  receive intensive therapy as well as Rehabilitation physician, nursing, social worker, and care management interventions.  Due to safety, disease management, medication administration, pain management, and patient education the patient requires 24 hour a day rehabilitation nursing.  The patient is currently min to mod assist with mobility and basic ADLs.  Discharge setting and therapy post discharge at  home  is anticipated.  Patient has agreed to participate in the Acute Inpatient Rehabilitation Program and will admit today.  Preadmission Screen Completed By:  Stephania Fragmin, PT, DPT 11/15/2021 10:15 AM ______________________________________________________________________   Discussed status with Dr. Natale Lay on 11/15/21  at 10:15 AM and received approval for admission today.  Admission Coordinator:  Stephania Fragmin, PT, DPT time 10:15 AM Dorna Bloom 11/15/21    Assessment/Plan: Diagnosis: Osteomyelitis/discitis Does the need for close, 24 hr/day Medical supervision in concert with the patient's rehab needs make it unreasonable for this patient to be served in a less intensive setting? Yes Co-Morbidities requiring supervision/potential complications:  DM, HTN, MI, CAD s/p CABG,  recent PPM and DCCV for afib, CHF, and CKD,psoas abscess, urinary retention, and AKI Due to bladder management, bowel management, safety, skin/wound care, disease management, medication administration, pain management, and patient education, does the patient require 24 hr/day rehab nursing? Yes Does the patient require coordinated care of a physician, rehab nurse, PT, OT, and SLP to address physical and functional deficits in the context of the above medical diagnosis(es)? Yes Addressing deficits in the following areas: balance, endurance, locomotion, strength, transferring, bowel/bladder control, bathing, dressing, feeding, grooming, and toileting Can the patient actively participate in an intensive therapy program of at least 3 hrs of therapy 5 days a week? Yes The potential for patient to make measurable gains while on inpatient rehab is excellent Anticipated functional outcomes upon discharge from inpatient rehab: modified independent PT, modified independent OT, n/a SLP Estimated rehab length of stay to reach the above functional goals is: 7-10  Anticipated discharge destination: Home 10. Overall Rehab/Functional Prognosis: excellent   MD Signature: Fanny Dance

## 2021-11-11 ENCOUNTER — Inpatient Hospital Stay: Payer: Self-pay

## 2021-11-11 DIAGNOSIS — R6521 Severe sepsis with septic shock: Secondary | ICD-10-CM | POA: Diagnosis not present

## 2021-11-11 DIAGNOSIS — A419 Sepsis, unspecified organism: Secondary | ICD-10-CM | POA: Diagnosis not present

## 2021-11-11 LAB — BASIC METABOLIC PANEL
Anion gap: 8 (ref 5–15)
BUN: 70 mg/dL — ABNORMAL HIGH (ref 8–23)
CO2: 25 mmol/L (ref 22–32)
Calcium: 8.1 mg/dL — ABNORMAL LOW (ref 8.9–10.3)
Chloride: 100 mmol/L (ref 98–111)
Creatinine, Ser: 1.88 mg/dL — ABNORMAL HIGH (ref 0.61–1.24)
GFR, Estimated: 36 mL/min — ABNORMAL LOW (ref >60)
Glucose, Bld: 218 mg/dL — ABNORMAL HIGH (ref 70–99)
Potassium: 4.4 mmol/L (ref 3.5–5.1)
Sodium: 133 mmol/L — ABNORMAL LOW (ref 135–145)

## 2021-11-11 LAB — SEDIMENTATION RATE: Sed Rate: 84 mm/hr — ABNORMAL HIGH (ref 0–16)

## 2021-11-11 LAB — GLUCOSE, CAPILLARY
Glucose-Capillary: 255 mg/dL — ABNORMAL HIGH (ref 70–99)
Glucose-Capillary: 252 mg/dL — ABNORMAL HIGH (ref 70–99)
Glucose-Capillary: 222 mg/dL — ABNORMAL HIGH (ref 70–99)
Glucose-Capillary: 231 mg/dL — ABNORMAL HIGH (ref 70–99)

## 2021-11-11 LAB — C-REACTIVE PROTEIN: CRP: 12.4 mg/dL — ABNORMAL HIGH (ref <1.0)

## 2021-11-11 NOTE — Progress Notes (Signed)
Assessed pt BUE re the PICC order - PICC to be changed due to selling of RUE and [place] negative for DVT.  Noted BUE with edema dependany from axilla to mid forearm without relation to PICC.  BUE warm, good palpable pulses and no variance in color.  Noted to have left sided pacemaker.  Secure chat sent to Dr Sloan Leiter and Dr Baxter Flattery.  New order to cancel the PICC exchange.

## 2021-11-11 NOTE — Progress Notes (Signed)
PROGRESS NOTE    Glenn [name]  [id number] DOB: 1941-02-28 DOA: 10/10/2021 PCP: System, Provider Not In    Brief Narrative:  Glenn [name] is a 81 y.o. 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   Evidently malaise and generalized weakness started few days prior to admission, had anorexia and poor PO intake, got weaker and weaker, fell and developed low back pain that became worse and called EMS.   5/31: In the ER, BP 70/40, WBC 24K, RR 25, Na 129, INR 3, Cr 3.62 (baseline 2018 1.1, none since), CT showed bladder distension and ?discitis/osteo of the spine 6/1: Arrived to [facility] on pressors, blood cultures with K pneumoniae in 2/2, UCX multiple (and had urinary retention with 1.7L out), TTE no vegetations 6/2: MRI without drainable focus, pressors titrated off, transferred out of ICU. 6/8: IR disc aspiration , cultures pending. Medically stable.  Waiting for CIR for bed availability.    Assessment & Plan:   Septic shock secondary to bacteremia due to Klebsiella pneumonia, UTI due to intermittent self-catheterization. Osteomyelitis of the lumbar spine.  Discitis and psoas abscess: Sepsis physiology is improving.  Currently remains on cefazolin.  Followed by infectious disease. Wound cultures pending from disc aspiration,  Has a PICC line, ID recommended injectable cefazolin until 7/18.  Medically stable to discharge to rehab.  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  Self cath at home.  Foley catheter placed due to recurrent retention.  Continue until improved mobility.  Paroxysmal A-fib: Rate controlled.  On Coreg.  Therapeutic on Eliquis.  Chronic combined heart failure: Euvolemic.  Entresto on hold.  Coreg and torsemide resumed.  Type 2 diabetes uncontrolled with hyperglycemia: Hemoglobin A1c more than 10.   Blood sugars better today.    AKI on CKD stage  IIIb: Creatinine improving back to his levels of 1.6-1.7.  Avoid nephrotoxins.  Moderate protein calorie malnutrition: Nutrition Status: Nutrition Problem: Moderate Malnutrition Etiology: chronic illness (CHF) Signs/Symptoms: moderate fat depletion, severe muscle depletion Interventions: MVI, Liberalize Diet, Other (Comment) (double protein portions)   Foot ulcer: Pressure Injury 11/02/21 Foot Left;Lateral Deep Tissue Pressure Injury - Purple or maroon localized area of discolored intact skin or blood-filled blister due to damage of underlying soft tissue from pressure and/or shear. 5.5x4 (Active)  11/02/21   Location: Foot  Location Orientation: Left;Lateral  Staging: Deep Tissue Pressure Injury - Purple or maroon localized area of discolored intact skin or blood-filled blister due to damage of underlying soft tissue from pressure and/or shear.  Wound Description (Comments): 5.5x4  Present on Admission: Yes       DVT prophylaxis: Place and maintain sequential compression device Start: 11/02/21 1108 apixaban (ELIQUIS) tablet 5 mg   Code Status: Full code Family Communication: None Disposition Plan: Status is: Inpatient Remains inpatient appropriate because: Waiting to transfer to acute inpatient rehab.  Medically stable.   Consultants:  Infectious disease Critical care Interventional radiology  Procedures:  None  Antimicrobials:  Cefazolin   Subjective:  Seen and examined.  No overnight events.  Pain is well controlled with occasional use of oxycodone.  Waiting for rehab.  Objective: Vitals:   11/10/21 1641 11/10/21 2106 11/11/21 0443 11/11/21 0925  BP: (!) 113/57 110/60 110/60 112/62  Pulse: 90 95 97 90  Resp: 18 18 18 18   Temp: 98.5 F (36.9 C) 98.3 F (36.8 C) 99.8 F (  37.7 C) 98.8 F (37.1 C)  TempSrc: Oral Oral Oral Oral  SpO2: 99% 95% 97% 98%  Weight:      Height:        Intake/Output Summary (Last 24 hours) at 11/11/2021 1320 Last data filed at  11/11/2021 0900 Gross per 24 hour  Intake 1239.24 ml  Output 1025 ml  Net 214.24 ml   Filed Weights   11/03/21 0600 11/04/21 0630 11/07/21 1500  Weight: 63.2 kg 64.2 kg 64.9 kg    Examination:  General exam: Appears calm and comfortable at rest.  In normal mood.  Oriented x4. Respiratory system: Clear to auscultation. Respiratory effort normal. Cardiovascular system: S1 & S2 heard, RRR. No JVD, murmurs, rubs, gallops or clicks. No pedal edema. Gastrointestinal system: Abdomen is nondistended, soft and nontender. No organomegaly or masses felt. Normal bowel sounds heard. Central nervous system: Alert and oriented. No focal neurological deficits. Extremities: Symmetric 5 x 5 power. Right arm and forearm with swelling and induration improving. Duplex is negative for DVT.  Distal neurovascular status intact. Foley catheter with clear urine.     Data Reviewed: I have personally reviewed following labs and imaging studies  CBC: Recent Labs  Lab 11/05/21 0513 11/06/21 0125 11/07/21 0319 11/08/21 0453  WBC 13.9* 17.0* 14.1* 14.3*  HGB 11.8* 11.7* 9.8* 10.5*  HCT 34.0* 33.5* 30.0* 31.0*  MCV 80.4 80.1 82.4 81.2  PLT 83* 110* 146* [id number]   Basic Metabolic Panel: Recent Labs  Lab 11/05/21 0513 11/06/21 0125 11/07/21 0319 11/08/21 0453 11/11/21 0336  NA 130* 131* 133* 133* 133*  K 4.4 4.5 4.3 4.1 4.4  CL 101 104 106 101 100  CO2 19* 21* 21* 22 25  GLUCOSE 270* 213* 199* 198* 218*  BUN 75* 75* 64* 62* 70*  CREATININE 1.90* 1.99* 1.61* 1.70* 1.88*  CALCIUM 8.5* 8.7* 8.6* 8.4* 8.1*   GFR: Estimated Creatinine Clearance: 29.7 mL/min (A) (by C-G formula based on SCr of 1.88 mg/dL (H)). Liver Function Tests: Recent Labs  Lab 11/05/21 0513 11/08/21 0453  AST 70* 23  ALT 82* 6  ALKPHOS 336* 226*  BILITOT 0.6 0.9  PROT 5.8* 5.9*  ALBUMIN 1.9* 1.8*   No results for input(s): "LIPASE", "AMYLASE" in the last 168 hours. No results for input(s): "AMMONIA" in the last 168  hours. Coagulation Profile: Recent Labs  Lab 11/05/21 1000 11/06/21 0125 11/08/21 0453  INR 1.4* 1.6* 1.3*   Cardiac Enzymes: No results for input(s): "CKTOTAL", "CKMB", "CKMBINDEX", "TROPONINI" in the last 168 hours.  BNP (last 3 results) No results for input(s): "PROBNP" in the last 8760 hours. HbA1C: No results for input(s): "HGBA1C" in the last 72 hours. CBG: Recent Labs  Lab 11/10/21 1133 11/10/21 1629 11/10/21 2102 11/11/21 0720 11/11/21 1127  GLUCAP 250* 245* 235* 255* 252*   Lipid Profile: No results for input(s): "CHOL", "HDL", "LDLCALC", "TRIG", "CHOLHDL", "LDLDIRECT" in the last 72 hours. Thyroid Function Tests: No results for input(s): "TSH", "T4TOTAL", "FREET4", "T3FREE", "THYROIDAB" in the last 72 hours. Anemia Panel: No results for input(s): "VITAMINB12", "FOLATE", "FERRITIN", "TIBC", "IRON", "RETICCTPCT" in the last 72 hours. Sepsis Labs: No results for input(s): "PROCALCITON", "LATICACIDVEN" in the last 168 hours.   Recent Results (from the past 240 hour(s))  Resp Panel by RT-PCR (Flu A&B, Covid) Urine, Clean Catch     Status: None   Collection Time: 10/15/2021  5:07 PM   Specimen: Urine, Clean Catch; Nasal Swab  Result Value Ref Range Status   SARS Coronavirus 2 by  RT PCR NEGATIVE NEGATIVE Final    Comment: (NOTE) SARS-CoV-2 target nucleic acids are NOT DETECTED.  The SARS-CoV-2 RNA is generally detectable in upper respiratory specimens during the acute phase of infection. The lowest concentration of SARS-CoV-2 viral copies this assay can detect is 138 copies/mL. A negative result does not preclude SARS-Cov-2 infection and should not be used as the sole basis for treatment or other patient management decisions. A negative result may occur with  improper specimen collection/handling, submission of specimen other than nasopharyngeal swab, presence of viral mutation(s) within the areas targeted by this assay, and inadequate number of  viral copies(<138 copies/mL). A negative result must be combined with clinical observations, patient history, and epidemiological information. The expected result is Negative.  Fact Sheet for Patients:  EntrepreneurPulse.com.au  Fact Sheet for Healthcare Providers:  IncredibleEmployment.be  This test is no t yet approved or cleared by the [place] FDA and  has been authorized for detection and/or diagnosis of SARS-CoV-2 by FDA under an Emergency Use Authorization (EUA). This EUA will remain  in effect (meaning this test can be used) for the duration of the COVID-19 declaration under Section 564(b)(1) of the Act, 21 U.S.C.section 360bbb-3(b)(1), unless the authorization is terminated  or revoked sooner.       Influenza A by PCR NEGATIVE NEGATIVE Final   Influenza B by PCR NEGATIVE NEGATIVE Final    Comment: (NOTE) The Xpert Xpress SARS-CoV-2/FLU/RSV plus assay is intended as an aid in the diagnosis of influenza from Nasopharyngeal swab specimens and should not be used as a sole basis for treatment. Nasal washings and aspirates are unacceptable for Xpert Xpress SARS-CoV-2/FLU/RSV testing.  Fact Sheet for Patients: EntrepreneurPulse.com.au  Fact Sheet for Healthcare Providers: IncredibleEmployment.be  This test is not yet approved or cleared by the [place] FDA and has been authorized for detection and/or diagnosis of SARS-CoV-2 by FDA under an Emergency Use Authorization (EUA). This EUA will remain in effect (meaning this test can be used) for the duration of the COVID-19 declaration under Section 564(b)(1) of the Act, 21 U.S.C. section 360bbb-3(b)(1), unless the authorization is terminated or revoked.  Performed at [facility], [address] [place], [place] [address]   Blood Culture (routine x 2)     Status: Abnormal   Collection Time: 10/12/2021  5:07 PM   Specimen: BLOOD   Result Value Ref Range Status   Specimen Description   Final    BLOOD BLOOD LEFT HAND Performed at [facility], [address] [place], [place] [address]    Special Requests   Final    BOTTLES DRAWN AEROBIC AND ANAEROBIC Blood Culture adequate volume Performed at [facility], [address] [place], [place] [address]    Culture  Setup Time   Final    IN BOTH AEROBIC AND ANAEROBIC BOTTLES GRAM NEGATIVE RODS CRITICAL VALUE NOTED.  VALUE IS CONSISTENT WITH PREVIOUSLY REPORTED AND CALLED VALUE. Performed at [facility], [address] [place], [place] [address]    Culture (A)  Final    KLEBSIELLA PNEUMONIAE SUSCEPTIBILITIES PERFORMED ON PREVIOUS CULTURE WITHIN THE LAST 5 DAYS. Performed at [facility], [place] [address], [place], [place] [address]    Report Status 11/04/2021 FINAL  Final  Blood Culture (routine x 2)     Status: Abnormal   Collection Time: 10/20/2021  5:07 PM   Specimen: BLOOD  Result Value Ref Range Status   Specimen Description   Final    BLOOD BLOOD  LEFT FOREARM Performed at [facility], [address] [place], [place] [address]    Special Requests   Final    BOTTLES DRAWN AEROBIC AND ANAEROBIC Blood Culture results may not be optimal due to an inadequate volume of blood received in culture bottles Performed at [facility], [address] [place], [place] [address]    Culture  Setup Time   Final    Organism ID to follow GRAM NEGATIVE RODS IN BOTH AEROBIC AND ANAEROBIC BOTTLES CRITICAL RESULT CALLED TO, READ BACK BY AND VERIFIED WITH: GREG ABBOTT @ [id number] ON 11/02/2021.Marland KitchenMarland KitchenTKR Performed at [facility], [address] [place], [place] [address]    Culture KLEBSIELLA PNEUMONIAE (A)  Final   Report Status 11/04/2021 FINAL  Final   Organism ID, Bacteria KLEBSIELLA PNEUMONIAE  Final      Susceptibility   Klebsiella pneumoniae - MIC*    AMPICILLIN RESISTANT Resistant     CEFAZOLIN <=4 SENSITIVE  Sensitive     CEFEPIME <=0.12 SENSITIVE Sensitive     CEFTAZIDIME <=1 SENSITIVE Sensitive     CEFTRIAXONE <=0.25 SENSITIVE Sensitive     CIPROFLOXACIN <=0.25 SENSITIVE Sensitive     GENTAMICIN <=1 SENSITIVE Sensitive     IMIPENEM <=0.25 SENSITIVE Sensitive     TRIMETH/SULFA <=20 SENSITIVE Sensitive     AMPICILLIN/SULBACTAM 4 SENSITIVE Sensitive     PIP/TAZO 8 SENSITIVE Sensitive     * KLEBSIELLA PNEUMONIAE  Urine Culture     Status: Abnormal   Collection Time: 10/07/2021  5:07 PM   Specimen: In/Out Cath Urine  Result Value Ref Range Status   Specimen Description   Final    IN/OUT CATH URINE Performed at [facility], [address] [place], [place] [address]    Special Requests   Final    NONE Performed at [facility], [address] [place], [place] [address]    Culture MULTIPLE SPECIES PRESENT, SUGGEST RECOLLECTION (A)  Final   Report Status 11/02/2021 FINAL  Final  Blood Culture ID Panel (Reflexed)     Status: Abnormal   Collection Time: 10/30/2021  5:07 PM  Result Value Ref Range Status   Enterococcus faecalis NOT DETECTED NOT DETECTED Final   Enterococcus Faecium NOT DETECTED NOT DETECTED Final   Listeria monocytogenes NOT DETECTED NOT DETECTED Final   Staphylococcus species NOT DETECTED NOT DETECTED Final   Staphylococcus aureus (BCID) NOT DETECTED NOT DETECTED Final   Staphylococcus epidermidis NOT DETECTED NOT DETECTED Final   Staphylococcus lugdunensis NOT DETECTED NOT DETECTED Final   Streptococcus species NOT DETECTED NOT DETECTED Final   Streptococcus agalactiae NOT DETECTED NOT DETECTED Final   Streptococcus pneumoniae NOT DETECTED NOT DETECTED Final   Streptococcus pyogenes NOT DETECTED NOT DETECTED Final   A.calcoaceticus-baumannii NOT DETECTED NOT DETECTED Final   Bacteroides fragilis NOT DETECTED NOT DETECTED Final   Enterobacterales DETECTED (A) NOT DETECTED Final    Comment: Enterobacterales represent a large order of gram negative  bacteria, not a single organism. CRITICAL RESULT CALLED TO, READ BACK BY AND VERIFIED WITH: GREG ABBOTT @ [id number] ON 11/02/2021.Marland KitchenMarland KitchenTKR    Enterobacter cloacae complex NOT DETECTED NOT DETECTED Final   Escherichia coli NOT DETECTED NOT DETECTED Final   Klebsiella aerogenes NOT DETECTED NOT DETECTED Final   Klebsiella oxytoca NOT DETECTED NOT DETECTED Final   Klebsiella pneumoniae DETECTED (A) NOT DETECTED Final    Comment: CRITICAL RESULT CALLED TO, READ BACK BY AND VERIFIED WITH: GREG ABBOTT @ [id number] ON 11/02/2021.Marland KitchenMarland KitchenTKR    Proteus  species NOT DETECTED NOT DETECTED Final   Salmonella species NOT DETECTED NOT DETECTED Final   Serratia marcescens NOT DETECTED NOT DETECTED Final   Haemophilus influenzae NOT DETECTED NOT DETECTED Final   Neisseria meningitidis NOT DETECTED NOT DETECTED Final   Pseudomonas aeruginosa NOT DETECTED NOT DETECTED Final   Stenotrophomonas maltophilia NOT DETECTED NOT DETECTED Final   Candida albicans NOT DETECTED NOT DETECTED Final   Candida auris NOT DETECTED NOT DETECTED Final   Candida glabrata NOT DETECTED NOT DETECTED Final   Candida krusei NOT DETECTED NOT DETECTED Final   Candida parapsilosis NOT DETECTED NOT DETECTED Final   Candida tropicalis NOT DETECTED NOT DETECTED Final   Cryptococcus neoformans/gattii NOT DETECTED NOT DETECTED Final   CTX-M ESBL NOT DETECTED NOT DETECTED Final   Carbapenem resistance IMP NOT DETECTED NOT DETECTED Final   Carbapenem resistance KPC NOT DETECTED NOT DETECTED Final   Carbapenem resistance NDM NOT DETECTED NOT DETECTED Final   Carbapenem resist OXA 48 LIKE NOT DETECTED NOT DETECTED Final   Carbapenem resistance VIM NOT DETECTED NOT DETECTED Final    Comment: Performed at [facility], [address] [place], [place] [address]  MRSA Next Gen by PCR, Nasal     Status: None   Collection Time: 10/12/2021 11:47 PM   Specimen: Nasal Mucosa; Nasal Swab  Result Value Ref Range Status   MRSA by PCR Next Gen NOT DETECTED NOT  DETECTED Final    Comment: (NOTE) The GeneXpert MRSA Assay (FDA approved for NASAL specimens only), is one component of a comprehensive MRSA colonization surveillance program. It is not intended to diagnose MRSA infection nor to guide or monitor treatment for MRSA infections. Test performance is not FDA approved in patients less than 12 years old. Performed at [facility], [place] [address] [place], [place] [address]   Culture, blood (Routine X 2) w Reflex to ID Panel     Status: None (Preliminary result)   Collection Time: 11/07/21 12:48 PM   Specimen: BLOOD  Result Value Ref Range Status   Specimen Description BLOOD BLOOD LEFT ARM  Final   Special Requests   Final    BOTTLES DRAWN AEROBIC AND ANAEROBIC Blood Culture adequate volume   Culture   Final    NO GROWTH 4 DAYS Performed at [facility], [place] [address], [place], [place] [address]    Report Status PENDING  Incomplete  Culture, blood (Routine X 2) w Reflex to ID Panel     Status: None (Preliminary result)   Collection Time: 11/07/21  1:00 PM   Specimen: BLOOD  Result Value Ref Range Status   Specimen Description BLOOD BLOOD LEFT HAND  Final   Special Requests   Final    BOTTLES DRAWN AEROBIC AND ANAEROBIC Blood Culture adequate volume   Culture   Final    NO GROWTH 4 DAYS Performed at [facility], [place] [address], [place], [place] [address]    Report Status PENDING  Incomplete  Aerobic/Anaerobic Culture w Gram Stain (surgical/deep wound)     Status: None (Preliminary result)   Collection Time: 11/09/21  2:47 PM   Specimen: Abscess  Result Value Ref Range Status   Specimen Description ABSCESS  Final   Special Requests INVERTEBRAL DISC  Final   Gram Stain   Final    ABUNDANT WBC PRESENT,BOTH PMN AND MONONUCLEAR NO ORGANISMS SEEN Performed at [facility], [place] [address] [place], [place] [address]    Culture FEW  GRAM NEGATIVE RODS  Final   Report Status PENDING  Incomplete          Radiology Studies: [place] EKG SITE RITE  Result Date: 11/11/2021 If Site Rite image not attached, placement could not be confirmed due to current cardiac rhythm.  VAS [place] UPPER EXTREMITY VENOUS DUPLEX  Result Date: 11/10/2021 UPPER VENOUS STUDY  Patient Name:  JAYMARION Mendel  Date of Exam:   11/10/2021 Medical Rec #: [id number]       Accession #:    [id number] Date of Birth: 1941-02-24       Patient Gender: M Patient Age:   70 years Exam Location:  [facility] Procedure:      VAS [place] UPPER EXTREMITY VENOUS DUPLEX Referring Phys: Barb Merino --------------------------------------------------------------------------------  Indications: Swelling Other Indications: Suspect PICC line associated DVT. Limitations: Bandages. Comparison Study: No previous study to compare. Performing Technologist: Bobetta Lime  Examination Guidelines: A complete evaluation includes B-mode imaging, spectral Doppler, color Doppler, and power Doppler as needed of all accessible portions of each vessel. Bilateral testing is considered an integral part of a complete examination. Limited examinations for reoccurring indications may be performed as noted.  Right Findings: +----------+------------+---------+-----------+----------+-------+ RIGHT     CompressiblePhasicitySpontaneousPropertiesSummary +----------+------------+---------+-----------+----------+-------+ Subclavian    Full       Yes       Yes                      +----------+------------+---------+-----------+----------+-------+ Axillary      Full                                          +----------+------------+---------+-----------+----------+-------+ Brachial      Full                                          +----------+------------+---------+-----------+----------+-------+ Radial        Full                                          +----------+------------+---------+-----------+----------+-------+ Ulnar         Full                                           +----------+------------+---------+-----------+----------+-------+ Cephalic      Full                                          +----------+------------+---------+-----------+----------+-------+ Basilic       Full                                          +----------+------------+---------+-----------+----------+-------+ Unable to visualize the right internal jugular vein due to large bandage on the right side of the neck.  Left Findings: +----------+------------+---------+-----------+----------+-------+ LEFT      CompressiblePhasicitySpontaneousPropertiesSummary +----------+------------+---------+-----------+----------+-------+ Subclavian    Full       Yes  Yes                      +----------+------------+---------+-----------+----------+-------+  Summary:  Right: No evidence of deep vein thrombosis in the upper extremity. No evidence of superficial vein thrombosis in the upper extremity.  Left: No evidence of thrombosis in the subclavian.  *See table(s) above for measurements and observations.  Diagnosing physician: Servando Snare MD Electronically signed by Servando Snare MD on 11/10/2021 at 2:52:03 PM.    Final    IR LUMBAR [place] W/IMG GUIDE  Result Date: 11/09/2021 INDICATION: Low back pain, MR suggestive of discitis L2-3 EXAM: LUMBAR L2-3 DISC ASPIRATION UNDER FLUOROSCOPY MEDICATIONS: Lidocaine 1% subcutaneous ANESTHESIA/SEDATION: Intravenous Fentanyl 24mcg and Versed 1.5mg  were administered as conscious sedation during continuous monitoring of the patient's level of consciousness and physiological / cardiorespiratory status by the radiology RN, with a total moderate sedation time of 11 minutes. PROCEDURE: Informed written consent was obtained from the patient after a thorough discussion of the procedural risks, benefits and alternatives. All questions were addressed. Maximal Sterile Barrier Technique was utilized including caps, mask,  sterile gowns, sterile gloves, sterile drape, hand hygiene and skin antiseptic. A timeout was performed prior to the initiation of the procedure. The appropriate interspace was identified under fluoroscopy, corresponding to previous cross-sectional imaging. An appropriate skin entry site was determined. After local infiltration with 1% lidocaine, AN 18 gauge trocar needle was advanced into the interspace from RIGHT posterolateral extraforaminal approach. Needle tip position within the interspace confirmed on biplane images. Approximately 3.5 mL thin blood-tinged fluid were aspirated, sent for the requested laboratory studies. The patient tolerated the procedure well. FLUOROSCOPY TIME:  Radiation Exposure Index (as provided by the fluoroscopic device): 14 mGy air Kerma COMPLICATIONS: None immediate. IMPRESSION: Technically successful lumbar L2-3 disc aspiration under fluoroscopy. Electronically Signed   By: Lucrezia Europe M.D.   On: 11/09/2021 15:52        Scheduled Meds:  apixaban  5 mg Oral BID   artificial tears   Both Eyes QHS   atorvastatin  80 mg Oral QHS   carvedilol  6.25 mg Oral BID WC   chlorhexidine  15 mL Mouth Rinse BID   Chlorhexidine Gluconate Cloth  6 each Topical Q0600   finasteride  5 mg Oral Daily   insulin aspart  0-15 Units Subcutaneous TID WC   insulin aspart  0-5 Units Subcutaneous QHS   insulin detemir  10 Units Subcutaneous Daily   ketotifen  1 drop Both Eyes BID   latanoprost  1 drop Both Eyes QHS   mouth rinse  15 mL Mouth Rinse q12n4p   multivitamin with minerals  1 tablet Oral Daily   pantoprazole  40 mg Oral Q1200   polyethylene glycol  17 g Oral Daily   sodium chloride flush  10-40 mL Intracatheter Q12H   sodium chloride flush  10-40 mL Intracatheter Q12H   tamsulosin  0.8 mg Oral QHS   torsemide  20 mg Oral Daily   Continuous Infusions:  sodium chloride 250 mL (11/02/21 0205)   sodium chloride Stopped (11/04/21 0512)    ceFAZolin (ANCEF) IV 2 g (11/11/21  0636)     LOS: 10 days    Time spent: 35 minutes    Barb Merino, MD [facility] Pager [phone]

## 2021-11-12 DIAGNOSIS — R6521 Severe sepsis with septic shock: Secondary | ICD-10-CM | POA: Diagnosis not present

## 2021-11-12 DIAGNOSIS — A419 Sepsis, unspecified organism: Secondary | ICD-10-CM | POA: Diagnosis not present

## 2021-11-12 LAB — BASIC METABOLIC PANEL
Anion gap: 9 (ref 5–15)
BUN: 93 mg/dL — ABNORMAL HIGH (ref 8–23)
CO2: 24 mmol/L (ref 22–32)
Calcium: 8.1 mg/dL — ABNORMAL LOW (ref 8.9–10.3)
Chloride: 98 mmol/L (ref 98–111)
Creatinine, Ser: 2.64 mg/dL — ABNORMAL HIGH (ref 0.61–1.24)
GFR, Estimated: 24 mL/min — ABNORMAL LOW (ref >60)
Glucose, Bld: 279 mg/dL — ABNORMAL HIGH (ref 70–99)
Potassium: 5.3 mmol/L — ABNORMAL HIGH (ref 3.5–5.1)
Sodium: 131 mmol/L — ABNORMAL LOW (ref 135–145)

## 2021-11-12 LAB — CULTURE, BLOOD (ROUTINE X 2)
Culture: NO GROWTH
Culture: NO GROWTH
Special Requests: ADEQUATE
Special Requests: ADEQUATE

## 2021-11-12 LAB — GLUCOSE, CAPILLARY
Glucose-Capillary: 317 mg/dL — ABNORMAL HIGH (ref 70–99)
Glucose-Capillary: 302 mg/dL — ABNORMAL HIGH (ref 70–99)
Glucose-Capillary: 316 mg/dL — ABNORMAL HIGH (ref 70–99)
Glucose-Capillary: 181 mg/dL — ABNORMAL HIGH (ref 70–99)
Glucose-Capillary: 157 mg/dL — ABNORMAL HIGH (ref 70–99)

## 2021-11-12 MED ORDER — OXYCODONE HCL 5 MG PO TABS
5.0000 mg | ORAL_TABLET | ORAL | Status: DC | PRN
  Administered 2021-11-12 – 2021-12-04 (×21): 5 mg via ORAL
  Filled 2021-11-12 (×22): qty 1

## 2021-11-12 MED ORDER — INSULIN DETEMIR 100 UNIT/ML ~~LOC~~ SOLN
15.0000 [IU] | Freq: Every day | SUBCUTANEOUS | Status: DC
  Administered 2021-11-12 – 2021-11-14 (×3): 15 [IU] via SUBCUTANEOUS
  Filled 2021-11-12 (×3): qty 0.15

## 2021-11-12 MED ORDER — SODIUM CHLORIDE 0.9 % IV SOLN: INTRAVENOUS | Status: AC

## 2021-11-12 MED ORDER — CEFAZOLIN SODIUM-DEXTROSE 2-4 GM/100ML-% IV SOLN
2.0000 g | Freq: Two times a day (BID) | INTRAVENOUS | Status: DC
  Administered 2021-11-12 – 2021-11-20 (×16): 2 g via INTRAVENOUS
  Filled 2021-11-12 (×16): qty 100

## 2021-11-12 NOTE — Plan of Care (Signed)
  Problem: Clinical Measurements: Goal: Respiratory complications will improve Outcome: Progressing   Problem: Elimination: Goal: Will not experience complications related to urinary retention Outcome: Progressing   Problem: Pain Managment: Goal: General experience of comfort will improve Outcome: Progressing   Problem: Safety: Goal: Ability to remain free from injury will improve Outcome: Progressing   Problem: Skin Integrity: Goal: Risk for impaired skin integrity will decrease Outcome: Progressing   

## 2021-11-12 NOTE — Progress Notes (Signed)
PROGRESS NOTE    Glenn [name]  [id number] DOB: 06-18-1940 DOA: 10/11/2021 PCP: System, Provider Not In    Brief Narrative:  Glenn [name] is a 81 y.o. 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   Evidently malaise and generalized weakness started few days prior to admission, had anorexia and poor PO intake, got weaker and weaker, fell and developed low back pain that became worse and called EMS.   5/31: In the ER, BP 70/40, WBC 24K, RR 25, Na 129, INR 3, Cr 3.62 (baseline 2018 1.1, none since), CT showed bladder distension and ?discitis/osteo of the spine 6/1: Arrived to [facility] on pressors, blood cultures with K pneumoniae in 2/2, UCX multiple (and had urinary retention with 1.7L out), TTE no vegetations 6/2: MRI without drainable focus, pressors titrated off, transferred out of ICU. 6/8: IR disc aspiration , cultures pending. Medically stable.  Waiting for CIR for bed availability.    Assessment & Plan:   Septic shock secondary to bacteremia due to Klebsiella pneumonia, UTI due to intermittent self-catheterization. Osteomyelitis of the lumbar spine.  Discitis and psoas abscess: Sepsis physiology is improving.  Currently remains on cefazolin.  Followed by infectious disease. Wound cultures pending from disc aspiration,  Has a PICC line, ID recommended injectable cefazolin until 7/18.  Medically stable to discharge to rehab.  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  Self cath at home.  Foley catheter placed due to recurrent retention.  Continue until improved mobility.  Paroxysmal A-fib: Rate controlled.  On Coreg.  Therapeutic on Eliquis.  Chronic combined heart failure: Euvolemic.  Entresto on hold.  Coreg and torsemide resumed.  Will hold torsemide today due to worsening renal functions.  Type 2 diabetes uncontrolled with hyperglycemia: Hemoglobin A1c more  than 10.   Increase long-acting insulin to 15 units t today.  May need further escalation.  AKI on CKD stage IIIb: Creatinine 2.56, potassium 5.3 today.  Hold diuretics.  Recheck tomorrow morning.  Avoid nephrotoxins.  Moderate protein calorie malnutrition: Nutrition Status: Nutrition Problem: Moderate Malnutrition Etiology: chronic illness (CHF) Signs/Symptoms: moderate fat depletion, severe muscle depletion Interventions: MVI, Liberalize Diet, Other (Comment) (double protein portions)   Foot ulcer: Pressure Injury 11/02/21 Foot Left;Lateral Deep Tissue Pressure Injury - Purple or maroon localized area of discolored intact skin or blood-filled blister due to damage of underlying soft tissue from pressure and/or shear. 5.5x4 (Active)  11/02/21   Location: Foot  Location Orientation: Left;Lateral  Staging: Deep Tissue Pressure Injury - Purple or maroon localized area of discolored intact skin or blood-filled blister due to damage of underlying soft tissue from pressure and/or shear.  Wound Description (Comments): 5.5x4  Present on Admission: Yes       DVT prophylaxis: Place and maintain sequential compression device Start: 11/02/21 1108 apixaban (ELIQUIS) tablet 5 mg   Code Status: Full code Family Communication: None Disposition Plan: Status is: Inpatient Remains inpatient appropriate because: Waiting to transfer to acute inpatient rehab.  Medically stable.   Consultants:  Infectious disease Critical care Interventional radiology  Procedures:  None  Antimicrobials:  Cefazolin   Subjective:  Seen and examined.  No overnight events.  Waiting to go to rehab.  Pain is well controlled on oral pain medications.  Objective: Vitals:   11/11/21 1623 11/11/21 2048 11/12/21 0546 11/12/21 0818  BP: (!) 98/52 (!) 99/56 105/64 (!) 101/57  Pulse: 86 86 80 92  Resp: 18 17 17 18   Temp: 98 F (36.7 C) 98.8 F (37.1 C) 98.8 F (37.1 C) 98 F (36.7 C)  TempSrc: Oral Oral Oral  Oral  SpO2: 97% 97% 94% 96%  Weight:      Height:        Intake/Output Summary (Last 24 hours) at 11/12/2021 1045 Last data filed at 11/12/2021 0818 Gross per 24 hour  Intake 850.89 ml  Output 850 ml  Net 0.89 ml   Filed Weights   11/03/21 0600 11/04/21 0630 11/07/21 1500  Weight: 63.2 kg 64.2 kg 64.9 kg    Examination:  General exam: Appears calm and comfortable at rest.  In normal mood.  Oriented x4. Respiratory system: Clear to auscultation. Respiratory effort normal. Cardiovascular system: S1 & S2 heard, RRR. No JVD, murmurs, rubs, gallops or clicks. No pedal edema. Gastrointestinal system: Abdomen is nondistended, soft and nontender. No organomegaly or masses felt. Normal bowel sounds heard. Central nervous system: Alert and oriented. No focal neurological deficits. Extremities: Symmetric 5 x 5 power. Right arm and forearm with swelling and induration improving. Foley catheter with clear urine.     Data Reviewed: I have personally reviewed following labs and imaging studies  CBC: Recent Labs  Lab 11/06/21 0125 11/07/21 0319 11/08/21 0453  WBC 17.0* 14.1* 14.3*  HGB 11.7* 9.8* 10.5*  HCT 33.5* 30.0* 31.0*  MCV 80.1 82.4 81.2  PLT 110* 146* [id number]   Basic Metabolic Panel: Recent Labs  Lab 11/06/21 0125 11/07/21 0319 11/08/21 0453 11/11/21 0336 11/12/21 0409  NA 131* 133* 133* 133* 131*  K 4.5 4.3 4.1 4.4 5.3*  CL 104 106 101 100 98  CO2 21* 21* 22 25 24   GLUCOSE 213* 199* 198* 218* 279*  BUN 75* 64* 62* 70* 93*  CREATININE 1.99* 1.61* 1.70* 1.88* 2.64*  CALCIUM 8.7* 8.6* 8.4* 8.1* 8.1*   GFR: Estimated Creatinine Clearance: 21.2 mL/min (A) (by C-G formula based on SCr of 2.64 mg/dL (H)). Liver Function Tests: Recent Labs  Lab 11/08/21 0453  AST 23  ALT 6  ALKPHOS 226*  BILITOT 0.9  PROT 5.9*  ALBUMIN 1.8*   No results for input(s): "LIPASE", "AMYLASE" in the last 168 hours. No results for input(s): "AMMONIA" in the last 168  hours. Coagulation Profile: Recent Labs  Lab 11/06/21 0125 11/08/21 0453  INR 1.6* 1.3*   Cardiac Enzymes: No results for input(s): "CKTOTAL", "CKMB", "CKMBINDEX", "TROPONINI" in the last 168 hours.  BNP (last 3 results) No results for input(s): "PROBNP" in the last 8760 hours. HbA1C: No results for input(s): "HGBA1C" in the last 72 hours. CBG: Recent Labs  Lab 11/11/21 0720 11/11/21 1127 11/11/21 1622 11/11/21 2047 11/12/21 0724  GLUCAP 255* 252* 222* 231* 317*   Lipid Profile: No results for input(s): "CHOL", "HDL", "LDLCALC", "TRIG", "CHOLHDL", "LDLDIRECT" in the last 72 hours. Thyroid Function Tests: No results for input(s): "TSH", "T4TOTAL", "FREET4", "T3FREE", "THYROIDAB" in the last 72 hours. Anemia Panel: No results for input(s): "VITAMINB12", "FOLATE", "FERRITIN", "TIBC", "IRON", "RETICCTPCT" in the last 72 hours. Sepsis Labs: No results for input(s): "PROCALCITON", "LATICACIDVEN" in the last 168 hours.   Recent Results (from the past 240 hour(s))  Culture, blood (Routine X 2) w Reflex to ID Panel     Status: None (Preliminary result)   Collection Time: 11/07/21 12:48 PM   Specimen: BLOOD  Result Value Ref Range Status   Specimen Description BLOOD BLOOD LEFT ARM  Final   Special Requests  Final    BOTTLES DRAWN AEROBIC AND ANAEROBIC Blood Culture adequate volume   Culture   Final    NO GROWTH 4 DAYS Performed at [facility], [place] [address], [place], [place] [address]    Report Status PENDING  Incomplete  Culture, blood (Routine X 2) w Reflex to ID Panel     Status: None (Preliminary result)   Collection Time: 11/07/21  1:00 PM   Specimen: BLOOD  Result Value Ref Range Status   Specimen Description BLOOD BLOOD LEFT HAND  Final   Special Requests   Final    BOTTLES DRAWN AEROBIC AND ANAEROBIC Blood Culture adequate volume   Culture   Final    NO GROWTH 4 DAYS Performed at [facility], [place] [address] [place], [place] [address]    Report  Status PENDING  Incomplete  Aerobic/Anaerobic Culture w Gram Stain (surgical/deep wound)     Status: None (Preliminary result)   Collection Time: 11/09/21  2:47 PM   Specimen: Abscess  Result Value Ref Range Status   Specimen Description ABSCESS  Final   Special Requests INVERTEBRAL DISC  Final   Gram Stain   Final    ABUNDANT WBC PRESENT,BOTH PMN AND MONONUCLEAR NO ORGANISMS SEEN Performed at [facility], [place] [address] [place], [place] [address]    Culture   Final    FEW KLEBSIELLA PNEUMONIAE NO ANAEROBES ISOLATED; CULTURE IN PROGRESS FOR 5 DAYS    Report Status PENDING  Incomplete   Organism ID, Bacteria KLEBSIELLA PNEUMONIAE  Final      Susceptibility   Klebsiella pneumoniae - MIC*    AMPICILLIN RESISTANT Resistant     CEFAZOLIN <=4 SENSITIVE Sensitive     CEFEPIME <=0.12 SENSITIVE Sensitive     CEFTAZIDIME <=1 SENSITIVE Sensitive     CEFTRIAXONE <=0.25 SENSITIVE Sensitive     CIPROFLOXACIN <=0.25 SENSITIVE Sensitive     GENTAMICIN <=1 SENSITIVE Sensitive     IMIPENEM <=0.25 SENSITIVE Sensitive     TRIMETH/SULFA <=20 SENSITIVE Sensitive     AMPICILLIN/SULBACTAM 4 SENSITIVE Sensitive     PIP/TAZO 8 SENSITIVE Sensitive     * FEW KLEBSIELLA PNEUMONIAE         Radiology Studies: [place] EKG SITE RITE  Result Date: 11/11/2021 If Site Rite image not attached, placement could not be confirmed due to current cardiac rhythm.  VAS [place] UPPER EXTREMITY VENOUS DUPLEX  Result Date: 11/10/2021 UPPER VENOUS STUDY  Patient Name:  Glenn [name]  Date of Exam:   11/10/2021 Medical Rec #: [id number]       Accession #:    [id number] Date of Birth: 01-08-1941       Patient Gender: M Patient Age:   85 years Exam Location:  [facility] Procedure:      VAS [place] UPPER EXTREMITY VENOUS DUPLEX Referring Phys: Barb Merino --------------------------------------------------------------------------------  Indications: Swelling Other Indications: Suspect PICC line associated DVT. Limitations:  Bandages. Comparison Study: No previous study to compare. Performing Technologist: Bobetta Lime  Examination Guidelines: A complete evaluation includes B-mode imaging, spectral Doppler, color Doppler, and power Doppler as needed of all accessible portions of each vessel. Bilateral testing is considered an integral part of a complete examination. Limited examinations for reoccurring indications may be performed as noted.  Right Findings: +----------+------------+---------+-----------+----------+-------+ RIGHT     CompressiblePhasicitySpontaneousPropertiesSummary +----------+------------+---------+-----------+----------+-------+ Subclavian    Full       Yes       Yes                      +----------+------------+---------+-----------+----------+-------+  Axillary      Full                                          +----------+------------+---------+-----------+----------+-------+ Brachial      Full                                          +----------+------------+---------+-----------+----------+-------+ Radial        Full                                          +----------+------------+---------+-----------+----------+-------+ Ulnar         Full                                          +----------+------------+---------+-----------+----------+-------+ Cephalic      Full                                          +----------+------------+---------+-----------+----------+-------+ Basilic       Full                                          +----------+------------+---------+-----------+----------+-------+ Unable to visualize the right internal jugular vein due to large bandage on the right side of the neck.  Left Findings: +----------+------------+---------+-----------+----------+-------+ LEFT      CompressiblePhasicitySpontaneousPropertiesSummary +----------+------------+---------+-----------+----------+-------+ Subclavian    Full       Yes       Yes                       +----------+------------+---------+-----------+----------+-------+  Summary:  Right: No evidence of deep vein thrombosis in the upper extremity. No evidence of superficial vein thrombosis in the upper extremity.  Left: No evidence of thrombosis in the subclavian.  *See table(s) above for measurements and observations.  Diagnosing physician: Lemar Livings MD Electronically signed by Lemar Livings MD on 11/10/2021 at 2:52:03 PM.    Final         Scheduled Meds:  apixaban  5 mg Oral BID   artificial tears   Both Eyes QHS   atorvastatin  80 mg Oral QHS   carvedilol  6.25 mg Oral BID WC   chlorhexidine  15 mL Mouth Rinse BID   Chlorhexidine Gluconate Cloth  6 each Topical Q0600   finasteride  5 mg Oral Daily   insulin aspart  0-15 Units Subcutaneous TID WC   insulin aspart  0-5 Units Subcutaneous QHS   insulin detemir  15 Units Subcutaneous Daily   ketotifen  1 drop Both Eyes BID   latanoprost  1 drop Both Eyes QHS   mouth rinse  15 mL Mouth Rinse q12n4p   multivitamin with minerals  1 tablet Oral Daily   pantoprazole  40 mg Oral Q1200   polyethylene glycol  17 g Oral Daily   sodium chloride flush  10-40 mL Intracatheter Q12H  sodium chloride flush  10-40 mL Intracatheter Q12H   tamsulosin  0.8 mg Oral QHS   Continuous Infusions:  sodium chloride 250 mL (11/02/21 0205)   sodium chloride Stopped (11/04/21 0512)    ceFAZolin (ANCEF) IV       LOS: 11 days    Time spent: 35 minutes    Barb Merino, MD [facility] Pager [phone]

## 2021-11-12 NOTE — Progress Notes (Addendum)
PHARMACY NOTE:  ANTIMICROBIAL RENAL DOSAGE ADJUSTMENT  Current antimicrobial regimen includes a mismatch between antimicrobial dosage and estimated renal function.  As per policy approved by the Pharmacy & Therapeutics and Medical Executive Committees, the antimicrobial dosage will be adjusted accordingly.  Current antimicrobial dosage:  Cefazolin 2 g IV Q8H   Indication: Klebsiella pneumoniae osteomyelitis/discitis   Renal Function:  Estimated Creatinine Clearance: 21.2 mL/min (A) (by C-G formula based on SCr of 2.64 mg/dL (H)). []      On intermittent HD, scheduled: []      On CRRT    Antimicrobial dosage has been changed to:  Cefazolin 2g IV Q12H - result of increasing Scr/worsening renal function (Scr 1.61>1.7>1.88>2.64)  - other medications should not be contributing to Scr rise, patient has been receiving torsemide 20 mg PO daily (1/2 PTA dose)   OUTPATIENT  PARENTERAL ANTIBIOTIC THERAPY (OPAT)   Indication: Kleb PNA osteo/discitis Regimen: Cefazolin 2g IV every 12 hours End date: 12/19/21   IV antibiotic discharge orders are pended. To discharging provider:  please sign these orders via discharge navigator,  Select New Orders & click on the button choice - Manage This Unsigned Work.   NOTE: monitor for change in renal function for updating of OPAT orders while remaining inpatient   Thank you for allowing pharmacy to be a part of this patient's care.  , PharmD PGY-1 Acute Care Resident  11/12/2021 9:25 AM

## 2021-11-13 DIAGNOSIS — R6521 Severe sepsis with septic shock: Secondary | ICD-10-CM | POA: Diagnosis not present

## 2021-11-13 DIAGNOSIS — A419 Sepsis, unspecified organism: Secondary | ICD-10-CM | POA: Diagnosis not present

## 2021-11-13 LAB — GLUCOSE, CAPILLARY
Glucose-Capillary: 134 mg/dL — ABNORMAL HIGH (ref 70–99)
Glucose-Capillary: 163 mg/dL — ABNORMAL HIGH (ref 70–99)
Glucose-Capillary: 171 mg/dL — ABNORMAL HIGH (ref 70–99)
Glucose-Capillary: 201 mg/dL — ABNORMAL HIGH (ref 70–99)

## 2021-11-13 LAB — BASIC METABOLIC PANEL
Anion gap: 8 (ref 5–15)
BUN: 102 mg/dL — ABNORMAL HIGH (ref 8–23)
CO2: 23 mmol/L (ref 22–32)
Calcium: 7.8 mg/dL — ABNORMAL LOW (ref 8.9–10.3)
Chloride: 100 mmol/L (ref 98–111)
Creatinine, Ser: 2.6 mg/dL — ABNORMAL HIGH (ref 0.61–1.24)
GFR, Estimated: 24 mL/min — ABNORMAL LOW (ref >60)
Glucose, Bld: 118 mg/dL — ABNORMAL HIGH (ref 70–99)
Potassium: 4.9 mmol/L (ref 3.5–5.1)
Sodium: 131 mmol/L — ABNORMAL LOW (ref 135–145)

## 2021-11-13 MED ORDER — POLYETHYLENE GLYCOL 3350 17 G PO PACK
17.0000 g | PACK | Freq: Two times a day (BID) | ORAL | Status: DC
  Administered 2021-11-13 – 2021-12-02 (×21): 17 g via ORAL
  Filled 2021-11-13 (×34): qty 1

## 2021-11-13 MED ORDER — GLUCERNA SHAKE PO LIQD
237.0000 mL | Freq: Three times a day (TID) | ORAL | Status: DC
  Administered 2021-11-13 – 2021-11-18 (×9): 237 mL via ORAL

## 2021-11-13 NOTE — Progress Notes (Signed)
Physical Therapy Treatment Patient Details Name: Glenn [name] MRN: [id number] DOB: 1940/06/18 Today's Date: 11/13/2021   History of Present Illness Patient is a 81 y/o male who presents on 5/31 with generalized weakness and falls. 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 PMH includes DM, HTN, MI, CAD s/p CABG, recent PPM placement and DCCV for Afib, CHF, CKD, BPH. L2-L3 disc aspiration planned for 11/09/21.    PT Comments    Continuing work on functional mobility and activity tolerance;  session focused on funcitoanl transfers and ambulation; Needed 2 person assist to stand from low seat height; Motivated to walk, and took small steps with RW and chair follow to boost confidence; able to walk from window side of bed outside the door and into hallway towards nurses station; Continue to recommend acute inpatient rehab (AIR) for post-acute therapy needs.   Recommendations for follow up therapy are one component of a multi-disciplinary discharge planning process, led by the attending physician.  Recommendations may be updated based on patient status, additional functional criteria and insurance authorization.  Follow Up Recommendations  Acute inpatient rehab (3hours/day)     Assistance Recommended at Discharge Frequent or constant Supervision/Assistance  Patient can return home with the following A little help with walking and/or transfers;A little help with bathing/dressing/bathroom;Assistance with cooking/housework;Assist for transportation;Help with stairs or ramp for entrance   Equipment Recommendations  None recommended by PT    Recommendations for Other Services       Precautions / Restrictions Precautions Precautions: Fall     Mobility  Bed Mobility                    Transfers Overall transfer level: Needs assistance Equipment used: Rolling walker (2  wheels) Transfers: Sit to/from Stand Sit to Stand: Mod assist, +2 physical assistance           General transfer comment: First try standing from low seat height unsuccessful with one person assist; second and 3rd reps successful with mod assist of 2, giving bilateral support to rise    Ambulation/Gait Ambulation/Gait assistance: Min assist, +2 safety/equipment (chair follow) Gait Distance (Feet): 22 Feet Assistive device: Rolling walker (2 wheels) Gait Pattern/deviations: Step-to pattern, Shuffle, Decreased stride length, Trunk flexed, Narrow base of support Gait velocity: decreased     General Gait Details: Slowed gait speed. Cues for close RW proximity. MinA for balance and safety especially with turning   Stairs             Wheelchair Mobility    Modified Rankin (Stroke Patients Only)       Balance             Standing balance-Leahy Scale: Poor Standing balance comment: reliant on RW for support                            Cognition Arousal/Alertness: Awake/alert Behavior During Therapy: WFL for tasks assessed/performed Overall Cognitive Status: Within Functional Limits for tasks assessed                                 General Comments: "I'll do whatever you say - I want to get better"        Exercises      General Comments        Pertinent Vitals/Pain Pain  Assessment Pain Assessment: Faces Faces Pain Scale: Hurts little more Pain Location: back Pain Descriptors / Indicators: Discomfort Pain Intervention(s): Monitored during session    Home Living                          Prior Function            PT Goals (current goals can now be found in the care plan section) Acute Rehab PT Goals Patient Stated Goal: to go home PT Goal Formulation: With patient Time For Goal Achievement: 11/20/21 Potential to Achieve Goals: Good Progress towards PT goals: Progressing toward goals    Frequency    Min  3X/week      PT Plan Current plan remains appropriate    Co-evaluation              AM-PAC PT "6 Clicks" Mobility   Outcome Measure  Help needed turning from your back to your side while in a flat bed without using bedrails?: A Little Help needed moving from lying on your back to sitting on the side of a flat bed without using bedrails?: A Little Help needed moving to and from a bed to a chair (including a wheelchair)?: A Little Help needed standing up from a chair using your arms (e.g., wheelchair or bedside chair)?: Total Help needed to walk in hospital room?: Total Help needed climbing 3-5 steps with a railing? : Total 6 Click Score: 12    End of Session Equipment Utilized During Treatment: Gait belt Activity Tolerance: Patient tolerated treatment well Patient left: in chair;with call bell/phone within reach;with chair alarm set Nurse Communication: Mobility status PT Visit Diagnosis: Unsteadiness on feet (R26.81);Other abnormalities of gait and mobility (R26.89);Muscle weakness (generalized) (M62.81);Pain Pain - part of body:  (back)     Time: [id number] PT Time Calculation (min) (ACUTE ONLY): 28 min  Charges:  $Gait Training: 8-22 mins $Therapeutic Activity: 8-22 mins                     Roney Marion, [place] Office [phone]    Colletta [place] 11/13/2021, 5:07 PM

## 2021-11-13 NOTE — Progress Notes (Signed)
Occupational Therapy Treatment Patient Details Name: Glenn [name] MRN: [id number] DOB: 03-16-1941 Today's Date: 11/13/2021   History of present illness Patient is a 81 y/o male who presents on 5/31 with generalized weakness and falls. 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 PMH includes DM, HTN, MI, CAD s/p CABG, recent PPM placement and DCCV for Afib, CHF, CKD, BPH. L2-L3 disc aspiration planned for 11/09/21.   OT comments  Patient received in bed and eager to work with OT. Patient required verbal cues for side lying to sit and min guard assist. Transfer from EOB to recliner with RW and verbal cues for hand placement and patient demonstrated difficulty lowering down to sit. Patient instructed in BUE strengthening exercises with red therapy band. Acute OT to continue to follow.    Recommendations for follow up therapy are one component of a multi-disciplinary discharge planning process, led by the attending physician.  Recommendations may be updated based on patient status, additional functional criteria and insurance authorization.    Follow Up Recommendations  Acute inpatient rehab (3hours/day)    Assistance Recommended at Discharge Intermittent Supervision/Assistance  Patient can return home with the following  A little help with walking and/or transfers;A little help with bathing/dressing/bathroom;Assistance with cooking/housework;Direct supervision/assist for medications management;Direct supervision/assist for financial management;Assist for transportation   Equipment Recommendations  Other (comment) (TBD)    Recommendations for Other Services      Precautions / Restrictions Precautions Precautions: Fall Restrictions Weight Bearing Restrictions: No       Mobility Bed Mobility Overal bed mobility: Needs Assistance Bed Mobility: Rolling, Sidelying to Sit Rolling:  Min guard Sidelying to sit: Min guard       General bed mobility comments: min guard and verbal cues for technique    Transfers Overall transfer level: Needs assistance Equipment used: Rolling walker (2 wheels) Transfers: Sit to/from Stand, Bed to chair/wheelchair/BSC Sit to Stand: Min assist     Step pivot transfers: Min assist     General transfer comment: min assist to step pivot and verbal cues for hand placment. Patient had difficulty lowering down to seat     Balance Overall balance assessment: Needs assistance Sitting-balance support: No upper extremity supported, Feet supported Sitting balance-Leahy Scale: Fair     Standing balance support: Reliant on assistive device for balance, Bilateral upper extremity supported Standing balance-Leahy Scale: Poor Standing balance comment: reliant on RW for support                           ADL either performed or assessed with clinical judgement   ADL Overall ADL's : Needs assistance/impaired     Grooming: Wash/dry hands;Wash/dry face;Set up;Sitting Grooming Details (indicate cue type and reason): in recliner                               General ADL Comments: decline other ADL tasks    Extremity/Trunk Assessment              Vision       Perception     Praxis      Cognition Arousal/Alertness: Awake/alert Behavior During Therapy: Flat affect Overall Cognitive Status: Within Functional Limits for tasks assessed  General Comments: patient more agreeable to getting OOB        Exercises Exercises: General Upper Extremity General Exercises - Upper Extremity Shoulder Flexion: Strengthening, Both, 10 reps, Theraband, Seated Theraband Level (Shoulder Flexion): Level 2 (Red) Shoulder ABduction: Strengthening, Both, 10 reps, Seated, Theraband Theraband Level (Shoulder Abduction): Level 2 (Red) Elbow Flexion: Strengthening, Both, 15 reps,  Seated, Theraband Theraband Level (Elbow Flexion): Level 2 (Red) Elbow Extension: Strengthening, Both, 10 reps, Seated, Theraband Theraband Level (Elbow Extension): Level 2 (Red)    Shoulder Instructions       General Comments      Pertinent Vitals/ Pain       Pain Assessment Pain Assessment: Faces Faces Pain Scale: Hurts little more Pain Location: back Pain Descriptors / Indicators: Discomfort Pain Intervention(s): Monitored during session, Limited activity within patient's tolerance, Patient requesting pain meds-RN notified  Home Living                                          Prior Functioning/Environment              Frequency  Min 2X/week        Progress Toward Goals  OT Goals(current goals can now be found in the care plan section)  Progress towards OT goals: Progressing toward goals  Acute Rehab OT Goals Patient Stated Goal: get better OT Goal Formulation: With patient Time For Goal Achievement: 11/21/21 Potential to Achieve Goals: Good ADL Goals Pt Will Perform Lower Body Bathing: with modified independence;sit to/from stand Pt Will Perform Lower Body Dressing: with modified independence;sit to/from stand Pt Will Transfer to Toilet: with modified independence;ambulating Pt Will Perform Toileting - Clothing Manipulation and hygiene: with modified independence;sit to/from stand Pt Will Perform Tub/Shower Transfer: with supervision;ambulating;shower seat;rolling walker Pt/caregiver will Perform Home Exercise Program: Increased strength;Both right and left upper extremity;With theraband;Independently;With written HEP provided  Plan Discharge plan remains appropriate    Co-evaluation                 AM-PAC OT "6 Clicks" Daily Activity     Outcome Measure   Help from another person eating meals?: None Help from another person taking care of personal grooming?: A Little Help from another person toileting, which includes using  toliet, bedpan, or urinal?: A Little Help from another person bathing (including washing, rinsing, drying)?: A Little Help from another person to put on and taking off regular upper body clothing?: A Little Help from another person to put on and taking off regular lower body clothing?: A Little 6 Click Score: 19    End of Session Equipment Utilized During Treatment: Rolling walker (2 wheels)  OT Visit Diagnosis: Unsteadiness on feet (R26.81);Muscle weakness (generalized) (M62.81);Pain;Other symptoms and signs involving cognitive function   Activity Tolerance Patient tolerated treatment well   Patient Left in chair;with call bell/phone within reach;with chair alarm set   Nurse Communication Patient requests pain meds        Time: 2774-1287 OT Time Calculation (min): 24 min  Charges: OT General Charges $OT Visit: 1 Visit OT Treatments $Self Care/Home Management : 8-22 mins $Therapeutic Exercise: 8-22 mins  Alfonse Flavors, OTA Acute Rehabilitation Services  Pager [phone] Office [phone]   Dewain Penning 11/13/2021, 1:32 PM

## 2021-11-13 NOTE — Progress Notes (Signed)
Nutrition Follow-up  DOCUMENTATION CODES:   Non-severe (moderate) malnutrition in context of chronic illness  INTERVENTION:  Encourage adequate PO intake Discontinue double protein portions TID with meals Glucerna Shake po TID, each supplement provides 220 kcal and 10 grams of protein MVI with minerals daily Recommend addition of bowel regimen Request updated wt  NUTRITION DIAGNOSIS:   Moderate Malnutrition related to chronic illness (CHF) as evidenced by moderate fat depletion, severe muscle depletion.  Ongoing  GOAL:   Patient will meet greater than or equal to 90% of their needs  Addressing needs via meals and nutrition supplements  MONITOR:   PO intake, Supplement acceptance, Labs, Weight trends, Skin, I & O's  REASON FOR ASSESSMENT:   Consult Assessment of nutrition requirement/status  ASSESSMENT:   81 year old male who presented to the ED on 5/31 with generalized weakness. PMH of CAD s/p CABG, recent PPM placement, atrial fibrillation, DM, CHF, CAD. Pt admitted with septic shock 2/2 UTI with hydronephrosis, AKI, bacteremia, L psoas abscess, and vertebral osteomyelitis/discitis.  Pending [facility] authorization for CIR request.   Pt reports eating about 50% of his meals. He states that the regular portions have been too much food for him and has been unable to eat the double protein. We discussed nutrition supplements and he reports that he had previously tried Glucerna and is agreeable to receive these in place of double protein portions between his meals.   Meal completions:  06/09: 25%-breakfast, 50%-lunch, 100%-dinner 06/10: 100%-breakfast, 100%-lunch 06/11: 75%-breakfast, 50%-lunch, 25%-dinner   Pt endorses difficulty having a bowel movement, however denies abdominal discomfort. Spoke with MD about adding a bowel regimen.   No recent updated wt documentation. Will request updated wt and reassess at follow up.   Medications: SSI 0-15 units TID, 0-5 units QHS,  levemir 15 units daily, MVI, protonix, miralax, IV abx  Labs: sodium 131, BUN 102, Cr 2.60, Ca 7.8, CBG's 134-317 x24 hours  Diet Order:   Diet Order             Diet Carb Modified Fluid consistency: Thin; Room service appropriate? Yes  Diet effective now                   EDUCATION NEEDS:   Education needs have been addressed  Skin:  Skin Assessment: Skin Integrity Issues: Skin Integrity Issues:: DTI DTI: L foot  Last BM:  unknown  Height:   Ht Readings from Last 1 Encounters:  11/07/21 5\' 10"  (1.778 m)    Weight:   Wt Readings from Last 1 Encounters:  11/07/21 64.9 kg   BMI:  Body mass index is 20.53 kg/m.  Estimated Nutritional Needs:   Kcal:  1800-2000  Protein:  85-100 grams  Fluid:  >1.8 L  01/07/22, RDN, LDN Clinical Nutrition

## 2021-11-13 NOTE — Progress Notes (Signed)
Inpatient Rehab Admissions Coordinator:   Awaiting determination from [place] regarding prior auth request for CIR. Will continue to follow.   Shann Medal, PT, DPT Admissions Coordinator [phone] 11/13/21  9:23 AM

## 2021-11-13 NOTE — Progress Notes (Signed)
PROGRESS NOTE    Glenn [name]  [id number] DOB: 10/01/1940 DOA: 10/02/2021 PCP: System, Provider Not In    Brief Narrative:  Glenn [name] is a 81 y.o. 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   Evidently malaise and generalized weakness started few days prior to admission, had anorexia and poor PO intake, got weaker and weaker, fell and developed low back pain that became worse and called EMS.   5/31: In the ER, BP 70/40, WBC 24K, RR 25, Na 129, INR 3, Cr 3.62 (baseline 2018 1.1, none since), CT showed bladder distension and ?discitis/osteo of the spine 6/1: Arrived to [facility] on pressors, blood cultures with K pneumoniae in 2/2, UCX multiple (and had urinary retention with 1.7L out), TTE no vegetations 6/2: MRI without drainable focus, pressors titrated off, transferred out of ICU. 6/8: IR disc aspiration , cultures with Klebsiella. Medically stable.  Waiting for CIR for bed availability.    Assessment & Plan:   Septic shock secondary to bacteremia due to Klebsiella pneumonia, UTI due to intermittent self-catheterization. Osteomyelitis of the lumbar spine.  Discitis and psoas abscess: Sepsis physiology is improving.  Currently remains on cefazolin.  Followed by infectious disease. Wound cultures with Klebsiella. Has a PICC line, ID recommended injectable cefazolin until 7/18.  Medically stable to discharge to rehab.  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  Self cath at home.  Foley catheter placed due to recurrent retention.  Continue until improved mobility.  Paroxysmal A-fib: Rate controlled.  On Coreg.  Therapeutic on Eliquis.  Chronic combined heart failure: Euvolemic.  Hypoperfusion to the kidneys, holding Entresto and torsemide.  Overnight IV fluids, potassium and renal functions are stabilizing.  Type 2 diabetes uncontrolled with hyperglycemia:  Hemoglobin A1c more than 10.   On increasing dose of long-acting insulin.  Monitor.  AKI on CKD stage IIIb: Renal functions fluctuate, stabilizing since last 24 hours.  We will continue monitor.  Moderate protein calorie malnutrition: Nutrition Status: Nutrition Problem: Moderate Malnutrition Etiology: chronic illness (CHF) Signs/Symptoms: moderate fat depletion, severe muscle depletion Interventions: MVI, Liberalize Diet, Other (Comment) (double protein portions)   Foot ulcer: Pressure Injury 11/02/21 Foot Left;Lateral Deep Tissue Pressure Injury - Purple or maroon localized area of discolored intact skin or blood-filled blister due to damage of underlying soft tissue from pressure and/or shear. 5.5x4 (Active)  11/02/21   Location: Foot  Location Orientation: Left;Lateral  Staging: Deep Tissue Pressure Injury - Purple or maroon localized area of discolored intact skin or blood-filled blister due to damage of underlying soft tissue from pressure and/or shear.  Wound Description (Comments): 5.5x4  Present on Admission: Yes       DVT prophylaxis: Place and maintain sequential compression device Start: 11/02/21 1108 apixaban (ELIQUIS) tablet 5 mg   Code Status: Full code Family Communication: None Disposition Plan: Status is: Inpatient Remains inpatient appropriate because: Waiting to transfer to acute inpatient rehab.  Medically stable.   Consultants:  Infectious disease Critical care Interventional radiology  Procedures:  None  Antimicrobials:  Cefazolin   Subjective:  No new events.  Pain controlled.  Transfer when bed available.  Objective: Vitals:   11/12/21 0818 11/12/21 1639 11/12/21 2126 11/13/21 0648  BP: (!) 101/57 106/65 103/61 116/68  Pulse: 92 86 85 86  Resp: 18 18 18 16   Temp: 98 F (36.7 C) 98.3 F (36.8 C)  97.9 F (36.6 C) 98.2 F (36.8 C)  TempSrc: Oral Oral Oral Oral  SpO2: 96% 99% 98% 98%  Weight:      Height:        Intake/Output  Summary (Last 24 hours) at 11/13/2021 1048 Last data filed at 11/13/2021 0600 Gross per 24 hour  Intake 1392.77 ml  Output 1150 ml  Net 242.77 ml    Filed Weights   11/03/21 0600 11/04/21 0630 11/07/21 1500  Weight: 63.2 kg 64.2 kg 64.9 kg    Examination:  General exam: Chronically sick looking.  Frail debilitated but looks fairly comfortable today. Respiratory system: Clear to auscultation. Respiratory effort normal. Cardiovascular system: S1 & S2 heard, RRR. No JVD, murmurs, rubs, gallops or clicks. No pedal edema. Gastrointestinal system: Abdomen is nondistended, soft and nontender. No organomegaly or masses felt. Normal bowel sounds heard. Central nervous system: Alert and oriented. No focal neurological deficits.  Generalized weakness. Extremities: Symmetric 5 x 5 power. Right arm and forearm with some swelling. Foley catheter with clear urine.     Data Reviewed: I have personally reviewed following labs and imaging studies  CBC: Recent Labs  Lab 11/07/21 0319 11/08/21 0453  WBC 14.1* 14.3*  HGB 9.8* 10.5*  HCT 30.0* 31.0*  MCV 82.4 81.2  PLT 146* [id number]    Basic Metabolic Panel: Recent Labs  Lab 11/07/21 0319 11/08/21 0453 11/11/21 0336 11/12/21 0409 11/13/21 0214  NA 133* 133* 133* 131* 131*  K 4.3 4.1 4.4 5.3* 4.9  CL 106 101 100 98 100  CO2 21* 22 25 24 23   GLUCOSE 199* 198* 218* 279* 118*  BUN 64* 62* 70* 93* 102*  CREATININE 1.61* 1.70* 1.88* 2.64* 2.60*  CALCIUM 8.6* 8.4* 8.1* 8.1* 7.8*    GFR: Estimated Creatinine Clearance: 21.5 mL/min (A) (by C-G formula based on SCr of 2.6 mg/dL (H)). Liver Function Tests: Recent Labs  Lab 11/08/21 0453  AST 23  ALT 6  ALKPHOS 226*  BILITOT 0.9  PROT 5.9*  ALBUMIN 1.8*    No results for input(s): "LIPASE", "AMYLASE" in the last 168 hours. No results for input(s): "AMMONIA" in the last 168 hours. Coagulation Profile: Recent Labs  Lab 11/08/21 0453  INR 1.3*    Cardiac Enzymes: No results for  input(s): "CKTOTAL", "CKMB", "CKMBINDEX", "TROPONINI" in the last 168 hours.  BNP (last 3 results) No results for input(s): "PROBNP" in the last 8760 hours. HbA1C: No results for input(s): "HGBA1C" in the last 72 hours. CBG: Recent Labs  Lab 11/12/21 1124 11/12/21 1131 11/12/21 1639 11/12/21 2113 11/13/21 0724  GLUCAP 302* 316* 181* 157* 134*    Lipid Profile: No results for input(s): "CHOL", "HDL", "LDLCALC", "TRIG", "CHOLHDL", "LDLDIRECT" in the last 72 hours. Thyroid Function Tests: No results for input(s): "TSH", "T4TOTAL", "FREET4", "T3FREE", "THYROIDAB" in the last 72 hours. Anemia Panel: No results for input(s): "VITAMINB12", "FOLATE", "FERRITIN", "TIBC", "IRON", "RETICCTPCT" in the last 72 hours. Sepsis Labs: No results for input(s): "PROCALCITON", "LATICACIDVEN" in the last 168 hours.   Recent Results (from the past 240 hour(s))  Culture, blood (Routine X 2) w Reflex to ID Panel     Status: None   Collection Time: 11/07/21 12:48 PM   Specimen: BLOOD  Result Value Ref Range Status   Specimen Description BLOOD BLOOD LEFT ARM  Final   Special Requests   Final    BOTTLES DRAWN AEROBIC AND ANAEROBIC Blood Culture adequate volume   Culture   Final    NO GROWTH 5 DAYS  Performed at [facility], [place] [address] [place], [place] [address]    Report Status 11/12/2021 FINAL  Final  Culture, blood (Routine X 2) w Reflex to ID Panel     Status: None   Collection Time: 11/07/21  1:00 PM   Specimen: BLOOD  Result Value Ref Range Status   Specimen Description BLOOD BLOOD LEFT HAND  Final   Special Requests   Final    BOTTLES DRAWN AEROBIC AND ANAEROBIC Blood Culture adequate volume   Culture   Final    NO GROWTH 5 DAYS Performed at [facility], [place] [address] [place], [place] [address]    Report Status 11/12/2021 FINAL  Final  Aerobic/Anaerobic Culture w Gram Stain (surgical/deep wound)     Status: None (Preliminary result)   Collection Time: 11/09/21  2:47  PM   Specimen: Abscess  Result Value Ref Range Status   Specimen Description ABSCESS  Final   Special Requests INVERTEBRAL DISC  Final   Gram Stain   Final    ABUNDANT WBC PRESENT,BOTH PMN AND MONONUCLEAR NO ORGANISMS SEEN Performed at [facility], [place] [address] [place], [place] [address]    Culture   Final    FEW KLEBSIELLA PNEUMONIAE NO ANAEROBES ISOLATED; CULTURE IN PROGRESS FOR 5 DAYS    Report Status PENDING  Incomplete   Organism ID, Bacteria KLEBSIELLA PNEUMONIAE  Final      Susceptibility   Klebsiella pneumoniae - MIC*    AMPICILLIN RESISTANT Resistant     CEFAZOLIN <=4 SENSITIVE Sensitive     CEFEPIME <=0.12 SENSITIVE Sensitive     CEFTAZIDIME <=1 SENSITIVE Sensitive     CEFTRIAXONE <=0.25 SENSITIVE Sensitive     CIPROFLOXACIN <=0.25 SENSITIVE Sensitive     GENTAMICIN <=1 SENSITIVE Sensitive     IMIPENEM <=0.25 SENSITIVE Sensitive     TRIMETH/SULFA <=20 SENSITIVE Sensitive     AMPICILLIN/SULBACTAM 4 SENSITIVE Sensitive     PIP/TAZO 8 SENSITIVE Sensitive     * FEW KLEBSIELLA PNEUMONIAE         Radiology Studies: No results found.      Scheduled Meds:  apixaban  5 mg Oral BID   artificial tears   Both Eyes QHS   atorvastatin  80 mg Oral QHS   carvedilol  6.25 mg Oral BID WC   chlorhexidine  15 mL Mouth Rinse BID   Chlorhexidine Gluconate Cloth  6 each Topical Q0600   finasteride  5 mg Oral Daily   insulin aspart  0-15 Units Subcutaneous TID WC   insulin aspart  0-5 Units Subcutaneous QHS   insulin detemir  15 Units Subcutaneous Daily   ketotifen  1 drop Both Eyes BID   latanoprost  1 drop Both Eyes QHS   mouth rinse  15 mL Mouth Rinse q12n4p   multivitamin with minerals  1 tablet Oral Daily   pantoprazole  40 mg Oral Q1200   polyethylene glycol  17 g Oral Daily   sodium chloride flush  10-40 mL Intracatheter Q12H   sodium chloride flush  10-40 mL Intracatheter Q12H   tamsulosin  0.8 mg Oral QHS   Continuous Infusions:  sodium chloride  250 mL (11/02/21 0205)   sodium chloride 250 mL (11/12/21 1244)    ceFAZolin (ANCEF) IV 2 g (11/13/21 0806)     LOS: 12 days    Time spent: 35 minutes    Barb Merino, MD [facility] Pager [phone]

## 2021-11-14 DIAGNOSIS — R6521 Severe sepsis with septic shock: Secondary | ICD-10-CM | POA: Diagnosis not present

## 2021-11-14 DIAGNOSIS — A419 Sepsis, unspecified organism: Secondary | ICD-10-CM | POA: Diagnosis not present

## 2021-11-14 LAB — GLUCOSE, CAPILLARY
Glucose-Capillary: 219 mg/dL — ABNORMAL HIGH (ref 70–99)
Glucose-Capillary: 186 mg/dL — ABNORMAL HIGH (ref 70–99)
Glucose-Capillary: 169 mg/dL — ABNORMAL HIGH (ref 70–99)
Glucose-Capillary: 116 mg/dL — ABNORMAL HIGH (ref 70–99)

## 2021-11-14 LAB — AEROBIC/ANAEROBIC CULTURE W GRAM STAIN (SURGICAL/DEEP WOUND)

## 2021-11-14 MED ORDER — INSULIN DETEMIR 100 UNIT/ML ~~LOC~~ SOLN
18.0000 [IU] | Freq: Every day | SUBCUTANEOUS | Status: DC
  Administered 2021-11-15 – 2021-11-18 (×4): 18 [IU] via SUBCUTANEOUS
  Filled 2021-11-14 (×4): qty 0.18

## 2021-11-14 NOTE — Progress Notes (Signed)
Inpatient Rehab Admissions Coordinator:   Awaiting response from [place] regarding prior auth request for CIR. Will continue to follow.   Lissa Merlin, PT, [facility] Admissions Coordinator 11/14/21,10:32 AM

## 2021-11-14 NOTE — TOC Initial Note (Signed)
Transition of Care [facility]) - Initial/Assessment Note    Patient Details  Name: Glenn [name] MRN: [id number] Date of Birth: [birth year]  Transition of Care [facility]) CM/SW Contact:    Milinda Antis, [place] Phone Number: 11/14/2021, 3:06 PM  Clinical Narrative:                 CSW received consult for possible SNF placement at time of discharge as a backup to CIR. CSW spoke with patient at bedside.  Patient expressed understanding of PT recommendation and is agreeable to SNF placement at time of discharge if not approved for CIR. Patient reports preference for a facility in [place]. CSW discussed insurance authorization process and will provide Medicare SNF ratings list. Patient has received 4 COVID vaccines. CSW will send out referrals for review after CIR denial and VA SNF placement approval.  No further questions reported at this time.   Skilled Nursing Rehab Facilities-   [email or web address]   Ratings out of 5 possible   Name Address  Phone # [place] Inspection Overall  [facility] [address] [place] 4 5 2 3   [facility]  [address], [place] [phone] 3 2 5 5   [facility] [place], [place] [phone] 3 1 1 1   [place], [place] 3 2 4 4   [facility] [address] [place] 1 1 2 1   [place] 2 1 4 3   [facility] [address], [place] 5 2 3 4   [facility] [address] [place] 5 2 2 3   [address] ([place]) [place], [place] [phone] 5 1 2 2   [facility] [phone], [place] [phone] 4 1 2 1   [facility] [address], [place] [facility] [phone] 4 1 2 1   [facility] ([place]) [place]. [place], [place] [phone] 4 1 1 1   [facility] [address] [place] [phone] 3 2 4 4           [place], [place]      [facility] [place] 4 2 3 3   [facility] [place] [address], [place] 4 1 5 4   [facility], [place] [address], [place] [phone] 2 1 1 1   [facility] [address], [place] [phone] 2 1 3 2           [address] (no [facility]) [facility] [address], [place] [phone] 4 5 5 5   [facility] (No Humana) [phone] [address], [place] 3 1 4 3   [facility] (No UHC) [place], [place] 5 5 5 5   [facility] [address], [address] [phone] 3 2 4 4   [place] [address] [place] 1 1 2 1   [place] [address] [place] 2 1 1 1   [place], [place] 5 2 4 5   [facility] [address], [place] 3 1 1 1   [facility] [place], [place] 2 1 2 1           [facility] [address] [place] [place] 1 1 1 1   [address], [place]  [phone] 2 4 2 2   [facility] [place] [address], [place] [phone]  5 2 3 4   [facility] [place] [address], [place] 2 1 1 1   [place] (No Humana) [address] [place] [phone] 2 1 3 2   [facility] [address] [place] [phone] 3 1 1 1           [facility] [place], [place] 5 4 5 5   [facility] ([place])  [address], [place] 2 2 3 3   [facility]) [place] [address] [place] 3 2 4 4   [place] [address] [place] 4 3 4 4   [address] [place], [place] 3 3 1 1   [facility]) [place], [place] 2 2 4 4      Expected Discharge Plan: Home/Self Care Barriers to Discharge: Continued Medical Work up   Patient Goals and CMS Choice Patient states their goals for this hospitalization and ongoing recovery are:: Wants to get better to go home CMS Medicare.gov Compare Post Acute Care list provided to::  (n/a) Choice offered to / list presented to : NA  Expected Discharge Plan and Services Expected Discharge Plan: Home/Self Care In-house Referral: NA Discharge Planning Services: CM Consult Post Acute Care Choice: NA Living arrangements for the past 2 months: Single Family Home                 DME Arranged: N/A DME Agency: NA       HH Arranged: NA HH Agency: NA        Prior Living Arrangements/Services Living arrangements for the past 2 months: Single Family Home Lives with:: Spouse, Adult Children Patient language and need for interpreter reviewed:: Yes Do you feel safe going back to the place where you live?: Yes      Need for Family Participation in Patient Care: Yes (Comment) Care giver support system in place?: Yes (comment) Current home services: DME (wheelchair cane walker per patient) Criminal Activity/Legal Involvement Pertinent to Current Situation/Hospitalization: No - Comment as needed  Activities of Daily Living      Permission Sought/Granted Permission sought to share information with : Family Supports Permission granted to share information with : No              Emotional Assessment Appearance:: Appears stated age Attitude/Demeanor/Rapport: Gracious, Engaged Affect (typically observed): Accepting, Pleasant Orientation: : Oriented to Self, Oriented to Place, Oriented to  Time, Oriented to Situation Alcohol / Substance Use: Not Applicable Psych Involvement: No (comment)  Admission diagnosis:  Septic shock ([place]) [A41.9, R65.21] Patient Active Problem List   Diagnosis Date Noted   Foot ulcer ([place]) 11/06/2021   Discitis 11/05/2021    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[place]) 11/05/2021   Chronic kidney disease, stage 3b ([place]) 11/05/2021   Coronary artery disease involving native coronary artery of native heart without angina pectoris 11/05/2021   Chronic combined systolic and diastolic CHF (congestive heart failure) ([place]) 11/05/2021   Hyponatremia 11/05/2021   Hypokalemia 11/05/2021   Transaminitis 11/05/2021   Myocardial injury 11/05/2021   Thrombocytopenia ([place]) 11/05/2021   Anemia due to chronic kidney disease 11/05/2021   Coagulopathy ([place]) 11/05/2021   Protein-calorie malnutrition, moderate ([place]) 11/05/2021   Paroxysmal atrial fibrillation ([place]) 11/05/2021   Septic shock ([place]) 11/02/2021   Bacteremia due to Klebsiella pneumoniae 11/02/2021   Acute kidney injury ([place]) 11/02/2021   Bladder outlet obstruction 11/02/2021  Uncontrolled type 2 diabetes mellitus with hyperglycemia, with long-term current use of insulin ([place]) 11/02/2021   Pacemaker 11/02/2021   PCP:  System, Provider Not In Pharmacy:   [facility] [address] [place] [address] Phone: [phone] Fax: [place], [place] [address] Phone: [phone] Fax: [phone]     Social Determinants of Health ([place]) Interventions    Readmission Risk Interventions    11/06/2021   12:28 PM  Readmission Risk Prevention Plan  Transportation Screening Complete  PCP or Specialist Appt within 3-5 Days Complete  HRI or [place] Complete  Social Work Consult for [place] Planning/Counseling Complete  Palliative Care Screening Not Applicable  Medication Review (RN Care Manager) Referral to Pharmacy

## 2021-11-14 NOTE — Plan of Care (Signed)
?  Problem: Clinical Measurements: ?Goal: Will remain free from infection ?Outcome: Progressing ?  ?

## 2021-11-14 NOTE — Progress Notes (Signed)
Inpatient Rehab Admissions Coordinator:   Received approval from [place] for CIR admission.  I do not have a bed for this patient but will follow for potential admit in the next 1-3 days pending availability.   Estill Dooms, PT, DPT Admissions Coordinator [phone] 11/14/21  3:44 PM

## 2021-11-14 NOTE — Progress Notes (Signed)
PROGRESS NOTE    Levar Knierim  [id number] DOB: 11-19-1940 DOA: 10/02/2021 PCP: System, Provider Not In    Brief Narrative:  Mr. Kolpack is a 81 y.o. 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  Patient does intermittent catheterization at home whenever he feels like doing usually every 48 hours.   Evidently malaise and generalized weakness started few days prior to admission, had anorexia and poor PO intake, got weaker and weaker, fell and developed low back pain that became worse and called EMS.   5/31: In the ER, BP 70/40, WBC 24K, RR 25, Na 129, INR 3, Cr 3.62 (baseline 2018 1.1, none since), CT showed bladder distension and ?discitis/osteo of the spine 6/1: Arrived to [facility] on pressors, blood cultures with K pneumoniae in 2/2, UCX multiple (and had urinary retention with 1.7L out), TTE no vegetations 6/2: MRI without drainable focus, pressors titrated off, transferred out of ICU. 6/8: IR disc aspiration , cultures with Klebsiella. Medically stable.  Waiting for CIR for bed availability.    Assessment & Plan:   Septic shock secondary to bacteremia due to Klebsiella pneumonia, UTI due to intermittent self-catheterization.  Significant urinary retention. Osteomyelitis of the lumbar spine.  Discitis and psoas abscess: Sepsis physiology is improving.  Currently remains on cefazolin.  Followed by infectious disease. Wound cultures with Klebsiella. Has a PICC line, ID recommended injectable cefazolin until 7/18.  Medically stable to discharge to rehab.  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  Self cath at home.  Foley catheter placed due to recurrent retention.  Continue until improved mobility.  High risk of getting infection due to intermittent self-catheterization, he may better off be on Foley catheter.  Paroxysmal A-fib: Rate controlled.  On Coreg.  Therapeutic on  Eliquis.  Chronic combined heart failure: Euvolemic.  Hypoperfusion to the kidneys, holding Entresto and torsemide.  Recheck renal functions tomorrow.  Type 2 diabetes uncontrolled with hyperglycemia: Hemoglobin A1c more than 10.   On increasing dose of long-acting insulin.  We will increase long-acting insulin further today to 18 units daily.  AKI on CKD stage IIIb: Renal functions fluctuate, stabilizing since last 24 hours.  We will continue monitor.  Recheck tomorrow morning.  Moderate protein calorie malnutrition: Nutrition Status: Nutrition Problem: Moderate Malnutrition Etiology: chronic illness (CHF) Signs/Symptoms: moderate fat depletion, severe muscle depletion Interventions: MVI, Liberalize Diet, Other (Comment) (double protein portions)   Foot ulcer: Pressure Injury 11/02/21 Foot Left;Lateral Deep Tissue Pressure Injury - Purple or maroon localized area of discolored intact skin or blood-filled blister due to damage of underlying soft tissue from pressure and/or shear. 5.5x4 (Active)  11/02/21   Location: Foot  Location Orientation: Left;Lateral  Staging: Deep Tissue Pressure Injury - Purple or maroon localized area of discolored intact skin or blood-filled blister due to damage of underlying soft tissue from pressure and/or shear.  Wound Description (Comments): 5.5x4  Present on Admission: Yes       DVT prophylaxis: Place and maintain sequential compression device Start: 11/02/21 1108 apixaban (ELIQUIS) tablet 5 mg   Code Status: Full code Family Communication: None, called patient's daughter, unable to talk. Disposition Plan: Status is: Inpatient Remains inpatient appropriate because: Waiting to transfer to acute inpatient rehab.  Medically stable.   Consultants:  Infectious disease Critical care Interventional radiology  Procedures:  None  Antimicrobials:  Cefazolin   Subjective:  Seen  and examined.  No overnight events.  Pain is controlled with oral  pain medications.  Remains afebrile.  Appetite is fair.  Objective: Vitals:   11/13/21 2033 11/14/21 0502 11/14/21 0508 11/14/21 0900  BP: 116/67 (!) 154/91 114/63 112/66  Pulse: 84 (!) 57  60  Resp: 18 20 20 18   Temp: 98.2 F (36.8 C) 98 F (36.7 C) 98 F (36.7 C) 98.2 F (36.8 C)  TempSrc: Oral Oral Oral Oral  SpO2: 100% 100% 100% 100%  Weight:      Height:        Intake/Output Summary (Last 24 hours) at 11/14/2021 1056 Last data filed at 11/14/2021 0900 Gross per 24 hour  Intake 1050 ml  Output 1750 ml  Net -700 ml   Filed Weights   11/03/21 0600 11/04/21 0630 11/07/21 1500  Weight: 63.2 kg 64.2 kg 64.9 kg    Examination:  General exam: Frail and debilitated chronically sick looking but not in any distress.  On room air.  Comfortable and conversant. Respiratory system: Clear to auscultation. Respiratory effort normal. Cardiovascular system: S1 & S2 heard, RRR. No JVD, murmurs, rubs, gallops or clicks. No pedal edema. Gastrointestinal system: Soft and nontender.  Bowel sound present.   Central nervous system: Alert and oriented. No focal neurological deficits.  Generalized weakness.  Profound weakness. Extremities: Symmetric 5 x 5 power. Right arm and forearm with some swelling. Foley catheter with clear urine.     Data Reviewed: I have personally reviewed following labs and imaging studies  CBC: Recent Labs  Lab 11/08/21 0453  WBC 14.3*  HGB 10.5*  HCT 31.0*  MCV 81.2  PLT [id number]   Basic Metabolic Panel: Recent Labs  Lab 11/08/21 0453 11/11/21 0336 11/12/21 0409 11/13/21 0214  NA 133* 133* 131* 131*  K 4.1 4.4 5.3* 4.9  CL 101 100 98 100  CO2 22 25 24 23   GLUCOSE 198* 218* 279* 118*  BUN 62* 70* 93* 102*  CREATININE 1.70* 1.88* 2.64* 2.60*  CALCIUM 8.4* 8.1* 8.1* 7.8*   GFR: Estimated Creatinine Clearance: 21.5 mL/min (A) (by C-G formula based on SCr of 2.6 mg/dL (H)). Liver Function Tests: Recent Labs  Lab 11/08/21 0453  AST 23  ALT 6   ALKPHOS 226*  BILITOT 0.9  PROT 5.9*  ALBUMIN 1.8*   No results for input(s): "LIPASE", "AMYLASE" in the last 168 hours. No results for input(s): "AMMONIA" in the last 168 hours. Coagulation Profile: Recent Labs  Lab 11/08/21 0453  INR 1.3*   Cardiac Enzymes: No results for input(s): "CKTOTAL", "CKMB", "CKMBINDEX", "TROPONINI" in the last 168 hours.  BNP (last 3 results) No results for input(s): "PROBNP" in the last 8760 hours. HbA1C: No results for input(s): "HGBA1C" in the last 72 hours. CBG: Recent Labs  Lab 11/13/21 0724 11/13/21 1131 11/13/21 1654 11/13/21 2044 11/14/21 0757  GLUCAP 134* 163* 171* 201* 219*   Lipid Profile: No results for input(s): "CHOL", "HDL", "LDLCALC", "TRIG", "CHOLHDL", "LDLDIRECT" in the last 72 hours. Thyroid Function Tests: No results for input(s): "TSH", "T4TOTAL", "FREET4", "T3FREE", "THYROIDAB" in the last 72 hours. Anemia Panel: No results for input(s): "VITAMINB12", "FOLATE", "FERRITIN", "TIBC", "IRON", "RETICCTPCT" in the last 72 hours. Sepsis Labs: No results for input(s): "PROCALCITON", "LATICACIDVEN" in the last 168 hours.   Recent Results (from the past 240 hour(s))  Culture, blood (Routine X 2) w Reflex to ID Panel     Status: None   Collection Time: 11/07/21 12:48 PM   Specimen: BLOOD  Result  Value Ref Range Status   Specimen Description BLOOD BLOOD LEFT ARM  Final   Special Requests   Final    BOTTLES DRAWN AEROBIC AND ANAEROBIC Blood Culture adequate volume   Culture   Final    NO GROWTH 5 DAYS Performed at [facility], [address], [place], [place] [address]    Report Status 11/12/2021 FINAL  Final  Culture, blood (Routine X 2) w Reflex to ID Panel     Status: None   Collection Time: 11/07/21  1:00 PM   Specimen: BLOOD  Result Value Ref Range Status   Specimen Description BLOOD BLOOD LEFT HAND  Final   Special Requests   Final    BOTTLES DRAWN AEROBIC AND ANAEROBIC Blood Culture adequate volume    Culture   Final    NO GROWTH 5 DAYS Performed at [facility], [place] [address], [place], [place] [address]    Report Status 11/12/2021 FINAL  Final  Aerobic/Anaerobic Culture w Gram Stain (surgical/deep wound)     Status: None   Collection Time: 11/09/21  2:47 PM   Specimen: Abscess  Result Value Ref Range Status   Specimen Description ABSCESS  Final   Special Requests INVERTEBRAL DISC  Final   Gram Stain   Final    ABUNDANT WBC PRESENT,BOTH PMN AND MONONUCLEAR NO ORGANISMS SEEN    Culture   Final    FEW KLEBSIELLA PNEUMONIAE NO ANAEROBES ISOLATED Performed at [facility], [place] [address], [place],  [address]    Report Status 11/14/2021 FINAL  Final   Organism ID, Bacteria KLEBSIELLA PNEUMONIAE  Final      Susceptibility   Klebsiella pneumoniae - MIC*    AMPICILLIN RESISTANT Resistant     CEFAZOLIN <=4 SENSITIVE Sensitive     CEFEPIME <=0.12 SENSITIVE Sensitive     CEFTAZIDIME <=1 SENSITIVE Sensitive     CEFTRIAXONE <=0.25 SENSITIVE Sensitive     CIPROFLOXACIN <=0.25 SENSITIVE Sensitive     GENTAMICIN <=1 SENSITIVE Sensitive     IMIPENEM <=0.25 SENSITIVE Sensitive     TRIMETH/SULFA <=20 SENSITIVE Sensitive     AMPICILLIN/SULBACTAM 4 SENSITIVE Sensitive     PIP/TAZO 8 SENSITIVE Sensitive     * FEW KLEBSIELLA PNEUMONIAE         Radiology Studies: No results found.      Scheduled Meds:  apixaban  5 mg Oral BID   artificial tears   Both Eyes QHS   atorvastatin  80 mg Oral QHS   carvedilol  6.25 mg Oral BID WC   chlorhexidine  15 mL Mouth Rinse BID   Chlorhexidine Gluconate Cloth  6 each Topical Q0600   feeding supplement (GLUCERNA SHAKE)  237 mL Oral TID BM   finasteride  5 mg Oral Daily   insulin aspart  0-15 Units Subcutaneous TID WC   insulin aspart  0-5 Units Subcutaneous QHS   [START ON 11/15/2021] insulin detemir  18 Units Subcutaneous Daily   ketotifen  1 drop Both Eyes BID   latanoprost  1 drop Both Eyes QHS   mouth rinse  15 mL  Mouth Rinse q12n4p   multivitamin with minerals  1 tablet Oral Daily   pantoprazole  40 mg Oral Q1200   polyethylene glycol  17 g Oral BID   sodium chloride flush  10-40 mL Intracatheter Q12H   sodium chloride flush  10-40 mL Intracatheter Q12H   tamsulosin  0.8 mg Oral QHS   Continuous Infusions:  sodium  chloride 250 mL (11/02/21 0205)   sodium chloride 250 mL (11/12/21 1244)    ceFAZolin (ANCEF) IV 2 g (11/14/21 0815)     LOS: 13 days    Time spent: 35 minutes    Barb Merino, MD [facility] Pager [phone]

## 2021-11-15 ENCOUNTER — Encounter (HOSPITAL_COMMUNITY): Payer: Self-pay | Admitting: Critical Care Medicine

## 2021-11-15 ENCOUNTER — Inpatient Hospital Stay (HOSPITAL_COMMUNITY)
Admission: RE | Admit: 2021-11-15 | Payer: No Typology Code available for payment source | Source: Intra-hospital | Admitting: Physical Medicine and Rehabilitation

## 2021-11-15 DIAGNOSIS — A419 Sepsis, unspecified organism: Secondary | ICD-10-CM | POA: Diagnosis not present

## 2021-11-15 DIAGNOSIS — R6521 Severe sepsis with septic shock: Secondary | ICD-10-CM | POA: Diagnosis not present

## 2021-11-15 LAB — GLUCOSE, CAPILLARY
Glucose-Capillary: 185 mg/dL — ABNORMAL HIGH (ref 70–99)
Glucose-Capillary: 234 mg/dL — ABNORMAL HIGH (ref 70–99)
Glucose-Capillary: 181 mg/dL — ABNORMAL HIGH (ref 70–99)
Glucose-Capillary: 146 mg/dL — ABNORMAL HIGH (ref 70–99)

## 2021-11-15 LAB — BASIC METABOLIC PANEL
Anion gap: 9 (ref 5–15)
BUN: 89 mg/dL — ABNORMAL HIGH (ref 8–23)
CO2: 22 mmol/L (ref 22–32)
Calcium: 8.2 mg/dL — ABNORMAL LOW (ref 8.9–10.3)
Chloride: 101 mmol/L (ref 98–111)
Creatinine, Ser: 2.22 mg/dL — ABNORMAL HIGH (ref 0.61–1.24)
GFR, Estimated: 30 mL/min — ABNORMAL LOW (ref >60)
Glucose, Bld: 207 mg/dL — ABNORMAL HIGH (ref 70–99)
Potassium: 4.8 mmol/L (ref 3.5–5.1)
Sodium: 132 mmol/L — ABNORMAL LOW (ref 135–145)

## 2021-11-15 MED ORDER — FUROSEMIDE 10 MG/ML IJ SOLN
60.0000 mg | Freq: Two times a day (BID) | INTRAMUSCULAR | Status: DC
  Administered 2021-11-15 – 2021-11-17 (×4): 60 mg via INTRAVENOUS
  Filled 2021-11-15 (×4): qty 6

## 2021-11-15 NOTE — Progress Notes (Signed)
PROGRESS NOTE  Glenn [name]  [id number] DOB: 04/15/1941 DOA: 10/29/2021 PCP: System, Provider Not In   Brief Narrative:  Patient is a 81 y.o. M with CAD s/p CABG 96, ICM and sCHF EF <20%, DM, CKD IIIb baseline 1.7, pAF on Eliquis, hx PPM and BPH who presented with 3 days weakness, back pain to [facility].  Patient does intermittent catheterization at home whenever he feels like doing usually every 48 hours.  On presentation he was hyportensive.  Lab work showed leukocytosis.  Creatinine was in the range of 3.6.  CT imaging showed distended bladder, discitis/osteomyelitis of the spine.  He was transferred to [facility], ICU for pressures.  Foley was placed for urinary retention.  MRI did not show any drainable focus, pressure started off, transferred out of ICU.  Underwent disc aspiration on 6/8 by IR, cultures showed Klebsiella.  ID was consulted and recommended antibiotics.  PT/OT recommended CIR on discharge.  Looks volume overloaded today, starting on IV Lasix.  Assessment & Plan:  Principal Problem:   Septic shock ([place]) Active Problems:   Bacteremia due to Klebsiella pneumoniae   Discitis   Osteomyelitis of lumbar spine (HCC)   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HCC)   Bladder outlet obstruction   Acute kidney injury ([place])   Uncontrolled type 2 diabetes mellitus with hyperglycemia, with long-term current use of insulin (HCC)   Pacemaker   Chronic kidney disease, stage 3b (HCC)   Coronary artery disease involving native coronary artery of native heart without angina pectoris   Chronic combined systolic and diastolic CHF (congestive heart failure) (HCC)   Hyponatremia   Hypokalemia   Transaminitis   Myocardial injury   Thrombocytopenia (HCC)   Anemia due to chronic kidney disease   Coagulopathy (HCC)   Protein-calorie malnutrition, moderate (HCC)   Paroxysmal atrial fibrillation ([place])   Foot ulcer ([place])  Septic shock/Klebsiella bacteremia/UTI: Presented to [facility] initially with back pain, weakness.   Found to be very hypotensive.  Septic shock suspected.  Started on broad-spectrum antibiotics.  Sent to ICU at [facility] with pressor support.  Currently hemodynamically stable Found to have osteomyelitis/discitis of lumbar spine, psoas abscess.  Underwent disc space drainage by IR.  Culture resulted Klebsiella.  ID was consulted.  PICC line placed.  Recommended cefazolin till 7/18.  Sepsis physiology has resolved.  Leukocytosis is improving.  Chronic combined systolic/diastolic congestive heart failure/volume overload: Known history of systolic congestive heart failure.  EF is less than 20%, has grade 2 diastolic dysfunction.  Status post ICD placement.  Looks severely volume overloaded today along with bilateral lower extremity edema, scrotal edema.  Started on Lasix 60 mg twice daily.  Monitor input/output, daily weight.  Takes torsemide at home.  He follows with cardiology.  Also takes Entresto at home which we will resume at some point.  AKI on CKD stage IIIb: Baseline creatinine arounfd 1.8.  Presented with creatinine in the range of 3.6 last creatinine in the range of 2.6.  Checking new BMP today.  Continue Lasix  Coronary artery disease: No anginal symptoms.  On Lipitor, beta-blocker  Paroxysmal A-fib: Currently rate is controlled.  On Eliquis for anticoagulation, on Coreg for rate control.  Monitor on telemetry  Bladder outlet obstruction: On presentation, imaging showed distended bladder.  Takes finasteride, Flomax at home.  Does self cath at home.  Foley was placed here.  Continue Foley until IV Lasix discontinued.  Then will check voiding trial.  Type 2 diabetes/hyperglycemia: Last hemoglobin A1c of more than 10.  Continue current  insulin regimen.  Monitor blood sugars  Deconditioning/debility: PT/OT recommended CIR on discharge.    Nutrition Problem: Moderate Malnutrition Etiology: chronic illness (CHF) Pressure Injury 11/02/21 Foot Left;Lateral Deep Tissue Pressure Injury - Purple or  maroon localized area of discolored intact skin or blood-filled blister due to damage of underlying soft tissue from pressure and/or shear. 5.5x4 (Active)  11/02/21   Location: Foot  Location Orientation: Left;Lateral  Staging: Deep Tissue Pressure Injury - Purple or maroon localized area of discolored intact skin or blood-filled blister due to damage of underlying soft tissue from pressure and/or shear.  Wound Description (Comments): 5.5x4  Present on Admission: Yes  Dressing Type Gauze (Comment) 11/14/21 2155    DVT prophylaxis:Place and maintain sequential compression device Start: 11/02/21 1108 apixaban (ELIQUIS) tablet 5 mg     Code Status: Full Code  Family Communication: None at bedside  Patient status:Inpatient  Patient is from :Home  Anticipated discharge to:CIR  Estimated DC date: After improvement in the volume overload, started on IV Lasix today   Consultants: PCCM, IR, ID  Procedures: Dx aspiration  Antimicrobials:  Anti-infectives (From admission, onward)    Start     Dose/Rate Route Frequency Ordered Stop   11/12/21 1800  ceFAZolin (ANCEF) IVPB 2g/100 mL premix        2 g 200 mL/hr over 30 Minutes Intravenous Every 12 hours 11/12/21 0936     11/04/21 1400  ceFAZolin (ANCEF) IVPB 2g/100 mL premix  Status:  Discontinued        2 g 200 mL/hr over 30 Minutes Intravenous Every 8 hours 11/04/21 1058 11/12/21 0936   11/03/21 1800  vancomycin (VANCOCIN) IVPB 1000 mg/200 mL premix  Status:  Discontinued        1,000 mg 200 mL/hr over 60 Minutes Intravenous Every 48 hours 11/02/21 0259 11/02/21 0452   11/02/21 2200  cefTRIAXone (ROCEPHIN) 2 g in sodium chloride 0.9 % 100 mL IVPB  Status:  Discontinued        2 g 200 mL/hr over 30 Minutes Intravenous Every 24 hours 11/02/21 0452 11/04/21 1058   11/02/21 0230  ceFEPIme (MAXIPIME) 2 g in sodium chloride 0.9 % 100 mL IVPB  Status:  Discontinued        2 g 200 mL/hr over 30 Minutes Intravenous Every 24 hours 11/02/21  0131 11/02/21 0452       Subjective: Patient seen and examined at the bedside this morning.  He was hemodynamically stable during my evaluation.  Looks weak, deconditioned but overall comfortable, lying in bed.  On examination he was found to have severe bilateral lower extremity edema, scrotal edema.  Objective: Vitals:   11/14/21 1623 11/14/21 2155 11/15/21 0651 11/15/21 0932  BP: 128/68 114/75 122/67 (!) 109/58  Pulse: 81 81 88 84  Resp: 18 18 18 18   Temp: 98.2 F (36.8 C) 98 F (36.7 C) 98.2 F (36.8 C) 98.8 F (37.1 C)  TempSrc:  Oral Oral Oral  SpO2: 98% 96% 100% 98%  Weight:      Height:        Intake/Output Summary (Last 24 hours) at 11/15/2021 1146 Last data filed at 11/15/2021 0945 Gross per 24 hour  Intake 840 ml  Output 1475 ml  Net -635 ml   Filed Weights   11/03/21 0600 11/04/21 0630 11/07/21 1500  Weight: 63.2 kg 64.2 kg 64.9 kg    Examination:  General exam: Overall comfortable, not in distress, chronically ill looking, weak HEENT: PERRL Respiratory system: Bilateral basal crackles  Cardiovascular system: S1 & S2 heard, RRR.  Gastrointestinal system: Abdomen is nondistended, soft and nontender. Central nervous system: Alert and oriented Extremities: 3+ bilateral lower extremity pitting edema Skin: No rashes, no ulcers,no icterus   GU: Foley, scrotal edema   Data Reviewed: I have personally reviewed following labs and imaging studies  CBC: No results for input(s): "WBC", "NEUTROABS", "HGB", "HCT", "MCV", "PLT" in the last 168 hours. Basic Metabolic Panel: Recent Labs  Lab 11/11/21 0336 11/12/21 0409 11/13/21 0214  NA 133* 131* 131*  K 4.4 5.3* 4.9  CL 100 98 100  CO2 25 24 23   GLUCOSE 218* 279* 118*  BUN 70* 93* 102*  CREATININE 1.88* 2.64* 2.60*  CALCIUM 8.1* 8.1* 7.8*     Recent Results (from the past 240 hour(s))  Culture, blood (Routine X 2) w Reflex to ID Panel     Status: None   Collection Time: 11/07/21 12:48 PM   Specimen:  BLOOD  Result Value Ref Range Status   Specimen Description BLOOD BLOOD LEFT ARM  Final   Special Requests   Final    BOTTLES DRAWN AEROBIC AND ANAEROBIC Blood Culture adequate volume   Culture   Final    NO GROWTH 5 DAYS Performed at [facility], [address], [place], [place] [address]    Report Status 11/12/2021 FINAL  Final  Culture, blood (Routine X 2) w Reflex to ID Panel     Status: None   Collection Time: 11/07/21  1:00 PM   Specimen: BLOOD  Result Value Ref Range Status   Specimen Description BLOOD BLOOD LEFT HAND  Final   Special Requests   Final    BOTTLES DRAWN AEROBIC AND ANAEROBIC Blood Culture adequate volume   Culture   Final    NO GROWTH 5 DAYS Performed at [facility], [place] [address] [place], [place] [address]    Report Status 11/12/2021 FINAL  Final  Aerobic/Anaerobic Culture w Gram Stain (surgical/deep wound)     Status: None   Collection Time: 11/09/21  2:47 PM   Specimen: Abscess  Result Value Ref Range Status   Specimen Description ABSCESS  Final   Special Requests INVERTEBRAL DISC  Final   Gram Stain   Final    ABUNDANT WBC PRESENT,BOTH PMN AND MONONUCLEAR NO ORGANISMS SEEN    Culture   Final    FEW KLEBSIELLA PNEUMONIAE NO ANAEROBES ISOLATED Performed at [facility], [place] [address] [place],  [address]    Report Status 11/14/2021 FINAL  Final   Organism ID, Bacteria KLEBSIELLA PNEUMONIAE  Final      Susceptibility   Klebsiella pneumoniae - MIC*    AMPICILLIN RESISTANT Resistant     CEFAZOLIN <=4 SENSITIVE Sensitive     CEFEPIME <=0.12 SENSITIVE Sensitive     CEFTAZIDIME <=1 SENSITIVE Sensitive     CEFTRIAXONE <=0.25 SENSITIVE Sensitive     CIPROFLOXACIN <=0.25 SENSITIVE Sensitive     GENTAMICIN <=1 SENSITIVE Sensitive     IMIPENEM <=0.25 SENSITIVE Sensitive     TRIMETH/SULFA <=20 SENSITIVE Sensitive     AMPICILLIN/SULBACTAM 4 SENSITIVE Sensitive     PIP/TAZO 8 SENSITIVE Sensitive     * FEW KLEBSIELLA  PNEUMONIAE     Radiology Studies: No results found.  Scheduled Meds:  apixaban  5 mg Oral BID   artificial tears   Both Eyes QHS   atorvastatin  80 mg Oral QHS   carvedilol  6.25 mg Oral BID WC  chlorhexidine  15 mL Mouth Rinse BID   Chlorhexidine Gluconate Cloth  6 each Topical Q0600   feeding supplement (GLUCERNA SHAKE)  237 mL Oral TID BM   finasteride  5 mg Oral Daily   furosemide  60 mg Intravenous Q12H   insulin aspart  0-15 Units Subcutaneous TID WC   insulin aspart  0-5 Units Subcutaneous QHS   insulin detemir  18 Units Subcutaneous Daily   ketotifen  1 drop Both Eyes BID   latanoprost  1 drop Both Eyes QHS   mouth rinse  15 mL Mouth Rinse q12n4p   multivitamin with minerals  1 tablet Oral Daily   pantoprazole  40 mg Oral Q1200   polyethylene glycol  17 g Oral BID   sodium chloride flush  10-40 mL Intracatheter Q12H   sodium chloride flush  10-40 mL Intracatheter Q12H   tamsulosin  0.8 mg Oral QHS   Continuous Infusions:  sodium chloride 250 mL (11/02/21 0205)   sodium chloride 250 mL (11/12/21 1244)    ceFAZolin (ANCEF) IV 2 g (11/15/21 1028)     LOS: 14 days   Shelly Coss, MD [facility] P6/14/2023, 11:46 AM

## 2021-11-15 NOTE — Progress Notes (Signed)
Physical Therapy Treatment Patient Details Name: Glenn [name] MRN: [id number] DOB: May 21, 1941 Today's Date: 11/15/2021   History of Present Illness Patient is a 81 y/o male who presents on 5/31 with generalized weakness and falls. 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 PMH includes DM, HTN, MI, CAD s/p CABG, recent PPM placement and DCCV for Afib, CHF, CKD, BPH. L2-L3 disc aspiration planned for 11/09/21.    PT Comments    Continuing work on functional mobility and activity tolerance;  Agreeing to work with PT despite feeling quite tired; Noted MD reports pt looked volume overloaded today; Stood from bedside and from low recliner seat with moderate assist of 2; Walked a short distance in the room with chair follow; Continue to recommend acute inpatient rehab (AIR) for post-acute therapy needs.   Recommendations for follow up therapy are one component of a multi-disciplinary discharge planning process, led by the attending physician.  Recommendations may be updated based on patient status, additional functional criteria and insurance authorization.  Follow Up Recommendations  Acute inpatient rehab (3hours/day)     Assistance Recommended at Discharge Frequent or constant Supervision/Assistance  Patient can return home with the following A little help with walking and/or transfers;A little help with bathing/dressing/bathroom;Assistance with cooking/housework;Assist for transportation;Help with stairs or ramp for entrance   Equipment Recommendations  None recommended by PT    Recommendations for Other Services       Precautions / Restrictions Precautions Precautions: Fall     Mobility  Bed Mobility Overal bed mobility: Needs Assistance Bed Mobility: Rolling, Sidelying to Sit Rolling: Min guard Sidelying to sit: Min guard       General bed mobility comments:  increased time and verbal cues    Transfers Overall transfer level: Needs assistance Equipment used: Rolling walker (2 wheels), Ambulation equipment used Transfers: Sit to/from Stand Sit to Stand: Mod assist, +2 safety/equipment           General transfer comment: stood from EOB with mod assist and from recliner to [facility]    Ambulation/Gait Ambulation/Gait assistance: Min assist, +2 safety/equipment Gait Distance (Feet): 6 Feet Assistive device: Rolling walker (2 wheels) Gait Pattern/deviations: Step-to pattern, Shuffle, Decreased stride length, Trunk flexed, Narrow base of support Gait velocity: decreased     General Gait Details: Short steps, cues for upright posture; Good effort, but limited by fatigue   Stairs             Wheelchair Mobility    Modified Rankin (Stroke Patients Only)       Balance     Sitting balance-Leahy Scale: Fair       Standing balance-Leahy Scale: Poor Standing balance comment: reliant on RW for support                            Cognition Arousal/Alertness: Awake/alert Behavior During Therapy: WFL for tasks assessed/performed Overall Cognitive Status: Within Functional Limits for tasks assessed                                          Exercises      General Comments        Pertinent Vitals/Pain Pain Assessment Pain Assessment: Faces Faces Pain Scale: Hurts little more Pain Location: back, groin Pain Descriptors / Indicators: Aching, Discomfort,  Grimacing Pain Intervention(s): Monitored during session    Home Living                          Prior Function            PT Goals (current goals can now be found in the care plan section) Acute Rehab PT Goals Patient Stated Goal: to go home PT Goal Formulation: With patient Time For Goal Achievement: 11/20/21 Potential to Achieve Goals: Good Progress towards PT goals: Progressing toward goals (slowly; slightly volume  overloaded today)    Frequency    Min 3X/week      PT Plan Current plan remains appropriate    Co-evaluation              AM-PAC PT "6 Clicks" Mobility   Outcome Measure  Help needed turning from your back to your side while in a flat bed without using bedrails?: A Little Help needed moving from lying on your back to sitting on the side of a flat bed without using bedrails?: A Little Help needed moving to and from a bed to a chair (including a wheelchair)?: A Little Help needed standing up from a chair using your arms (e.g., wheelchair or bedside chair)?: Total Help needed to walk in hospital room?: Total Help needed climbing 3-5 steps with a railing? : Total 6 Click Score: 12    End of Session Equipment Utilized During Treatment: Gait belt Activity Tolerance: Patient tolerated treatment well (despite feeling quite tired) Patient left: in bed;with call bell/phone within reach;with nursing/sitter in room Nurse Communication: Mobility status PT Visit Diagnosis: Unsteadiness on feet (R26.81);Other abnormalities of gait and mobility (R26.89);Muscle weakness (generalized) (M62.81);Pain Pain - part of body:  (back)     Time: 1240-1302 PT Time Calculation (min) (ACUTE ONLY): 22 min  Charges:  $Gait Training: 8-22 mins                     Roney Marion, [place] Office [phone]    [place] 11/15/2021, 2:33 PM

## 2021-11-15 NOTE — Progress Notes (Signed)
Occupational Therapy Treatment Patient Details Name: Glenn [name] MRN: [id number] DOB: 1941/04/08 Today's Date: 11/15/2021   History of present illness Patient is a 81 y/o male who presents on 5/31 with generalized weakness and falls. 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 PMH includes DM, HTN, MI, CAD s/p CABG, recent PPM placement and DCCV for Afib, CHF, CKD, BPH. L2-L3 disc aspiration planned for 11/09/21.   OT comments  Patient received in supine and agreeable to OT treatment. Patient asked to use bedpan but agreed to [facility]. Patient was able to get to EOB with min guard and was mod assist to stand from EOB to transfer to [facility]. Patient attempted hygiene seated but required assistance to complete while standing. Bleeding noticed during cleaning at hemorrhoids and nursing notified.  Patient ambulated short distance to recliner with verbal cues for safety and walker use with mod assist to lower to seat. Patient was setup for grooming seated in recliner. Patient continues to make gains and would benefit from further OT in AIR settting.    Recommendations for follow up therapy are one component of a multi-disciplinary discharge planning process, led by the attending physician.  Recommendations may be updated based on patient status, additional functional criteria and insurance authorization.    Follow Up Recommendations  Acute inpatient rehab (3hours/day)    Assistance Recommended at Discharge Intermittent Supervision/Assistance  Patient can return home with the following  A little help with walking and/or transfers;A little help with bathing/dressing/bathroom;Assistance with cooking/housework;Direct supervision/assist for medications management;Direct supervision/assist for financial management;Assist for transportation   Equipment Recommendations  Other (comment) (TBD)     Recommendations for Other Services      Precautions / Restrictions Precautions Precautions: Fall Restrictions Weight Bearing Restrictions: No       Mobility Bed Mobility Overal bed mobility: Needs Assistance Bed Mobility: Rolling, Sidelying to Sit Rolling: Min guard Sidelying to sit: Min guard       General bed mobility comments: increased time and verbal cues    Transfers Overall transfer level: Needs assistance Equipment used: Rolling walker (2 wheels) Transfers: Sit to/from Stand, Bed to chair/wheelchair/BSC Sit to Stand: Mod assist     Step pivot transfers: Min assist     General transfer comment: stood from EOB with mod assist and from [place] Overall balance assessment: Needs assistance Sitting-balance support: No upper extremity supported, Feet supported Sitting balance-Leahy Scale: Fair Sitting balance - Comments: required min guard for safety   Standing balance support: Reliant on assistive device for balance, Bilateral upper extremity supported Standing balance-Leahy Scale: Poor Standing balance comment: reliant on RW for support                           ADL either performed or assessed with clinical judgement   ADL Overall ADL's : Needs assistance/impaired     Grooming: Set up;Sitting Grooming Details (indicate cue type and reason): in recliner                 Toilet Transfer: Minimal assistance;Stand-pivot;Rolling walker (2 wheels);BSC/3in1 Toilet Transfer Details (indicate cue type and reason): transferred from EOB to [place] Manipulation and Hygiene: Minimal assistance;Moderate assistance;Sit to/from stand Toileting - Clothing Manipulation Details (indicate cue type and reason): patient attempted to clean self seated and required assistance to complete       General ADL Comments: difficutly  with standing tasks    Extremity/Trunk Assessment              Vision       Perception      Praxis      Cognition Arousal/Alertness: Awake/alert Behavior During Therapy: WFL for tasks assessed/performed Overall Cognitive Status: Within Functional Limits for tasks assessed                                          Exercises      Shoulder Instructions       General Comments      Pertinent Vitals/ Pain       Pain Assessment Pain Assessment: Faces Faces Pain Scale: Hurts little more Pain Location: back, groin Pain Descriptors / Indicators: Aching, Discomfort, Grimacing Pain Intervention(s): Limited activity within patient's tolerance, Monitored during session, Repositioned  Home Living                                          Prior Functioning/Environment              Frequency  Min 2X/week        Progress Toward Goals  OT Goals(current goals can now be found in the care plan section)  Progress towards OT goals: Progressing toward goals  Acute Rehab OT Goals Patient Stated Goal: get stronger OT Goal Formulation: With patient Time For Goal Achievement: 11/21/21 Potential to Achieve Goals: Good ADL Goals Pt Will Perform Lower Body Bathing: with modified independence;sit to/from stand Pt Will Perform Lower Body Dressing: with modified independence;sit to/from stand Pt Will Transfer to Toilet: with modified independence;ambulating Pt Will Perform Toileting - Clothing Manipulation and hygiene: with modified independence;sit to/from stand Pt Will Perform Tub/Shower Transfer: with supervision;ambulating;shower seat;rolling walker Pt/caregiver will Perform Home Exercise Program: Increased strength;Both right and left upper extremity;With theraband;Independently;With written HEP provided  Plan Discharge plan remains appropriate    Co-evaluation                 AM-PAC OT "6 Clicks" Daily Activity     Outcome Measure   Help from another person eating meals?: None Help from another person taking care of personal  grooming?: A Little Help from another person toileting, which includes using toliet, bedpan, or urinal?: A Lot Help from another person bathing (including washing, rinsing, drying)?: A Little Help from another person to put on and taking off regular upper body clothing?: A Little Help from another person to put on and taking off regular lower body clothing?: A Little 6 Click Score: 18    End of Session Equipment Utilized During Treatment: Rolling walker (2 wheels)  OT Visit Diagnosis: Unsteadiness on feet (R26.81);Muscle weakness (generalized) (M62.81);Pain;Other symptoms and signs involving cognitive function   Activity Tolerance Patient tolerated treatment well   Patient Left in chair;with call bell/phone within reach;with chair alarm set   Nurse Communication Mobility status        Time: [id number] OT Time Calculation (min): 30 min  Charges: OT General Charges $OT Visit: 1 Visit OT Treatments $Self Care/Home Management : 23-37 mins  Lodema Hong, [place]  Pager [phone] Office [place] 11/15/2021, 10:08 AM

## 2021-11-15 NOTE — H&P (Incomplete)
Physical Medicine and Rehabilitation Admission H&P    CC: Functional decline due to sepsis secondary to epidural abscess with myelopathy.    HPI: Glenn [name] is a 89   CT abdomen/pelvis done showing gas within L2/L3 space and dissemination into left psoas and right retroperitoneum with question of discitis/osteomyelitis with gas forming organism and marked bladder distension c/w BOO.    EP consulted for input on CRT-D which was felt to MRI compatible and had rare runs of NSVT with other episodes marked as VT  with intermittent RV pacing. Echo ordered and cardiology consulted deferred. 2D echo showed EF <20% with Grade 2 DD and flattened interventricular septum in systole and diastole c/w with volume overload.     MRI L spine done revealing osteomyelitis discitis L2-L3 with small anterior epidural phlegmon without discrete abscess and Left>Right psoas muscle edema with tiny foci of gas.  MRI sacrum showed no osseous sacrum but extensive intramuscular edema involving iliopsoas and bilateral gluteal musculature suspicious for myositis, trace bilateral hip joint effusions likely non-reactive and generalized anasarca.     Patient noted to have left foot purple blood filled blister 5.5 cm X 4 cm and left pretib ulcer 2.5 X3 cm. WOC recommended local care. X ray showed severe great toe metatarsophalangeal joint osteoarthritis and soft tissue swelling/enlargement of 5th MT phalangeal joint--?wound v/s mass.      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 No pertinent family history.   Social History:  reports that he has quit smoking. He has never used smokeless tobacco. He reports current alcohol use. No history on file for drug use.   Medications Prior to Admission  Medication Sig Dispense Refill   apixaban (ELIQUIS) 5 MG TABS tablet Take 5 mg by mouth 2 (two) times daily.     atorvastatin (LIPITOR) 80 MG tablet Take 80 mg by mouth at bedtime.     carvedilol (COREG) 12.5 MG tablet Take 12.5 mg by mouth 2 (two) times daily with a meal.     cetirizine (ZYRTEC) 10 MG chewable tablet Chew 10 mg by mouth daily.     empagliflozin (JARDIANCE) 25 MG TABS tablet Take 12.5 mg by mouth daily.     ferrous sulfate 324 MG TBEC Take 324 mg by mouth in the morning and at bedtime.     finasteride (PROSCAR) 5 MG tablet Take 5 mg by mouth at bedtime.     hydroxypropyl methylcellulose / hypromellose (ISOPTO TEARS / GONIOVISC) 2.5 % ophthalmic solution Place 1 drop into both eyes 3 (three) times daily as needed for dry eyes.     hypromellose (GENTEAL) 0.3 % GEL ophthalmic ointment Place 1 application. into both eyes at bedtime.     insulin aspart protamine - aspart (  NOVOLOG MIX 70/30 FLEXPEN) (70-30) 100 UNIT/ML FlexPen Inject 10 Units into the skin at bedtime.     ketotifen (ZADITOR) 0.025 % ophthalmic solution Place 1 drop into both eyes 2 (two) times daily.     latanoprost (XALATAN) 0.005 % ophthalmic solution Place 1 drop into both eyes at bedtime.     liraglutide (VICTOZA) 18 MG/3ML SOPN Inject 1.8 mg into the skin daily.     metFORMIN (GLUCOPHAGE) 500 MG tablet Take 500 mg by mouth 2 (two) times daily with a meal. [facility]ified with [facility] 500 mg BID     pantoprazole (PROTONIX) 40 MG tablet Take 40 mg by mouth daily.     sacubitril-valsartan (ENTRESTO) 97-103 MG Take 0.5  tablets by mouth every 12 (twelve) hours.     tamsulosin (FLOMAX) 0.4 MG CAPS capsule Take 0.8 mg by mouth at bedtime.     torsemide (DEMADEX) 20 MG tablet Take 20 mg by mouth See admin instructions. Take 1 tablet (20 mg) daily. Then if weight gain of 3 lbs or more take another tablet (20 mg).     insulin NPH-regular Human (NOVOLIN 70/30) (70-30) 100 UNIT/ML injection Inject 25 Units into the skin daily. (Patient not taking: Reported on 11/02/2021)        Home: Home Living Family/patient expects to be discharged to:: Private residence Living Arrangements: Children, Spouse/significant other (wife is disabled, unable to help him at home. daughter is a Pharmacist, hospital and able to help in the evenings and on weekends) Available Help at Discharge: Family Type of Home: House Home Access: Ramped entrance Home Layout: One level Home Equipment: Conservation officer, nature (2 wheels), Wheelchair - manual, [employer] - single point   Functional History: Prior Function Prior Level of Function : Needs assist Mobility Comments: reports uses no AD for mobility at baseline. limited to household distances. ADLs Comments: daughters helps cook and clean and transport to appointments. Pt is adamant he needs no help with basic ADLs "(normally)I can do everything you can do."  Functional Status:  Mobility: Bed Mobility Overal bed mobility: Needs Assistance Bed Mobility: Rolling, Sidelying to Sit Rolling: Min guard Sidelying to sit: Min guard Sit to sidelying: Min assist General bed mobility comments: increased time and verbal cues Transfers Overall transfer level: Needs assistance Equipment used: Rolling walker (2 wheels) Transfers: Sit to/from Stand, Bed to chair/wheelchair/BSC Sit to Stand: Mod assist Bed to/from chair/wheelchair/BSC transfer type:: Step pivot Stand pivot transfers: Min assist, From elevated surface Step pivot transfers: Min assist General transfer comment: stood from EOB with mod assist and from  [facility] Ambulation/Gait Ambulation/Gait assistance: Min assist, +2 safety/equipment (chair follow) Gait Distance (Feet): 22 Feet Assistive device: Rolling walker (2 wheels) Gait Pattern/deviations: Step-to pattern, Shuffle, Decreased stride length, Trunk flexed, Narrow base of support General Gait Details: Slowed gait speed. Cues for close RW proximity. MinA for balance and safety especially with turning Gait velocity: decreased Gait velocity interpretation: <1.31 ft/sec, indicative of household ambulator    ADL: ADL Overall ADL's : Needs assistance/impaired Eating/Feeding: Set up, Sitting Grooming: Set up, Sitting Grooming Details (indicate cue type and reason): in recliner Upper Body Bathing: Minimal assistance, Sitting, Set up, Min guard Lower Body Bathing: Minimal assistance, Moderate assistance, Sit to/from stand Lower Body Bathing Details (indicate cue type and reason): assist for balance and task managment Upper Body Dressing : Set up, Minimal assistance, Sitting Lower Body Dressing: Minimal assistance, Moderate assistance, Sit to/from stand Lower Body Dressing Details (indicate cue type and reason): assist for balance and task managment Toilet  Transfer: Minimal assistance, Stand-pivot, Rolling walker (2 wheels), BSC/3in1 Toilet Transfer Details (indicate cue type and reason): transferred from EOB to [facility] Toileting- Clothing Manipulation and Hygiene: Minimal assistance, Moderate assistance, Sit to/from stand Toileting - Clothing Manipulation Details (indicate cue type and reason): patient attempted to clean self seated and required assistance to complete General ADL Comments: difficutly with standing tasks  Cognition: Cognition Overall Cognitive Status: Within Functional Limits for tasks assessed Orientation Level: Oriented X4 Cognition Arousal/Alertness: Awake/alert Behavior During Therapy: WFL for tasks assessed/performed Overall Cognitive Status: Within Functional Limits for  tasks assessed General Comments: "I'll do whatever you say - I want to get better"   Blood pressure (!) 109/58, pulse 84, temperature 98.8 F (37.1 C), temperature source Oral, resp. rate 18, height 5\' 10"  (1.778 m), weight 64.9 kg, SpO2 98 %. Physical Exam  Results for orders placed or performed during the hospital encounter of 10/11/2021 (from the past 48 hour(s))  Glucose, capillary     Status: Abnormal   Collection Time: 11/13/21 11:31 AM  Result Value Ref Range   Glucose-Capillary 163 (H) 70 - 99 mg/dL    Comment: Glucose reference range applies only to samples taken after fasting for at least 8 hours.  Glucose, capillary     Status: Abnormal   Collection Time: 11/13/21  4:54 PM  Result Value Ref Range   Glucose-Capillary 171 (H) 70 - 99 mg/dL    Comment: Glucose reference range applies only to samples taken after fasting for at least 8 hours.  Glucose, capillary     Status: Abnormal   Collection Time: 11/13/21  8:44 PM  Result Value Ref Range   Glucose-Capillary 201 (H) 70 - 99 mg/dL    Comment: Glucose reference range applies only to samples taken after fasting for at least 8 hours.  Glucose, capillary     Status: Abnormal   Collection Time: 11/14/21  7:57 AM  Result Value Ref Range   Glucose-Capillary 219 (H) 70 - 99 mg/dL    Comment: Glucose reference range applies only to samples taken after fasting for at least 8 hours.  Glucose, capillary     Status: Abnormal   Collection Time: 11/14/21 11:55 AM  Result Value Ref Range   Glucose-Capillary 186 (H) 70 - 99 mg/dL    Comment: Glucose reference range applies only to samples taken after fasting for at least 8 hours.  Glucose, capillary     Status: Abnormal   Collection Time: 11/14/21  4:16 PM  Result Value Ref Range   Glucose-Capillary 169 (H) 70 - 99 mg/dL    Comment: Glucose reference range applies only to samples taken after fasting for at least 8 hours.  Glucose, capillary     Status: Abnormal   Collection Time:  11/14/21  9:50 PM  Result Value Ref Range   Glucose-Capillary 116 (H) 70 - 99 mg/dL    Comment: Glucose reference range applies only to samples taken after fasting for at least 8 hours.  Glucose, capillary     Status: Abnormal   Collection Time: 11/15/21  7:50 AM  Result Value Ref Range   Glucose-Capillary 185 (H) 70 - 99 mg/dL    Comment: Glucose reference range applies only to samples taken after fasting for at least 8 hours.   No results found.    Blood pressure (!) 109/58, pulse 84, temperature 98.8 F (37.1 C), temperature source Oral, resp. rate 18, height 5\' 10"  (1.778 m), weight 64.9 kg, SpO2 98 %.  Medical Problem List  and Plan: 1. Functional deficits secondary to ***  -patient may *** shower  -ELOS/Goals: *** 2.  Antithrombotics: -DVT/anticoagulation:  Pharmaceutical: Other (comment)--Eliquis  -antiplatelet therapy: N/A 3. Pain Management:  4. Mood/Sleep: LCSW to follow for evaluation and support.   -antipsychotic agents: N/A 5. Neuropsych/cognition: This patient *** capable of making decisions on *** own behalf. 6. Left Foot ulcers/Skin/Wound Care: Xeroform with dry dressing per WOC  --monitor for changes/evolution  --Maintain adequate nutritional status.  7. Fluids/Electrolytes/Nutrition: Monitor I/O. Check CMET in am and weekly per protocol.  --Add juven and prostat as both are sugar free and will prevent BS spikes --Add Vitamin C/Zinc to promote healing. 8. Sepsis due to Kleb Pneumo bacteremia: 9. BPH/renal calculi/Chronic urinary retention: Question difficulty with self cath --Had marked bladder distension but less distension of collecting system c/w 2018. --Continue Foley to SD for now.   10. Osteomyelitis/discitis L2/L3 with L>R psoas edema:  11. Uncontrolled T2DM: Hgb A1c-10.6. BS poorly controlled with Glucerna between meals  --Change Glucerna to Ensure max  --continue Levemir  12. CAD s/p CABG w/ICM: EF < 20% with G2DD--> --Low salt diet.   --On coreg,   33. H/o ICM/NSVT/SVT/HB s/p PPM (MRI compatible): ON Coreg, Lipitor and Eliquis  --Off Entresto  and Demadex for now due to low BP/AKI  -Off Jardiance due to infection 14. ICM/Chronic combined CHF: On Coreg,  --Started on Lasix BID due to massive overload 06/14  --weight up form 131-->143-->  --Check wts daily and continue to monitor for signs of overload   15. PAF: monitor HR TID. Monitor for symptoms with increase in activity.    --On Eliquis 16. CKD IIB:  Renal status improved off diuretics.  --Question cardio-renal syndrome with anasarca/ICM-EF<25%  --will monitor with serial checks.    ***  Bary Leriche, PA-C 11/15/2021

## 2021-11-15 NOTE — Progress Notes (Addendum)
Inpatient Rehab Admissions Coordinator:   I have a bed available for this patient to admit to CIR today.  Dr. Renford Dills in agreement and [facility] aware.  I will let pt/family know.   Addendum: Per Dr. Renford Dills, pt looking volume overloaded today.  Will hold admit to CIR for further acute medical management.  Will continue to follow for timing of potential admit pending readiness and bed availability.   Estill Dooms, PT, DPT Admissions Coordinator [phone] 11/15/21  10:14 AM

## 2021-11-16 DIAGNOSIS — R6521 Severe sepsis with septic shock: Secondary | ICD-10-CM | POA: Diagnosis not present

## 2021-11-16 DIAGNOSIS — A419 Sepsis, unspecified organism: Secondary | ICD-10-CM | POA: Diagnosis not present

## 2021-11-16 LAB — BASIC METABOLIC PANEL
Anion gap: 8 (ref 5–15)
BUN: 90 mg/dL — ABNORMAL HIGH (ref 8–23)
CO2: 24 mmol/L (ref 22–32)
Calcium: 8.2 mg/dL — ABNORMAL LOW (ref 8.9–10.3)
Chloride: 101 mmol/L (ref 98–111)
Creatinine, Ser: 2.24 mg/dL — ABNORMAL HIGH (ref 0.61–1.24)
GFR, Estimated: 29 mL/min — ABNORMAL LOW (ref >60)
Glucose, Bld: 125 mg/dL — ABNORMAL HIGH (ref 70–99)
Potassium: 4.8 mmol/L (ref 3.5–5.1)
Sodium: 133 mmol/L — ABNORMAL LOW (ref 135–145)

## 2021-11-16 LAB — CBC
HCT: 25.1 % — ABNORMAL LOW (ref 39.0–52.0)
Hemoglobin: 8 g/dL — ABNORMAL LOW (ref 13.0–17.0)
MCH: 26.8 pg (ref 26.0–34.0)
MCHC: 31.9 g/dL (ref 30.0–36.0)
MCV: 83.9 fL (ref 80.0–100.0)
Platelets: 302 10*3/uL (ref 150–400)
RBC: 2.99 MIL/uL — ABNORMAL LOW (ref 4.22–5.81)
RDW: 16.7 % — ABNORMAL HIGH (ref 11.5–15.5)
WBC: 8.4 10*3/uL (ref 4.0–10.5)
nRBC: 0 % (ref 0.0–0.2)

## 2021-11-16 LAB — GLUCOSE, CAPILLARY
Glucose-Capillary: 139 mg/dL — ABNORMAL HIGH (ref 70–99)
Glucose-Capillary: 155 mg/dL — ABNORMAL HIGH (ref 70–99)
Glucose-Capillary: 114 mg/dL — ABNORMAL HIGH (ref 70–99)
Glucose-Capillary: 138 mg/dL — ABNORMAL HIGH (ref 70–99)

## 2021-11-16 NOTE — Progress Notes (Signed)
PROGRESS NOTE  Glenn [name]  [id number] DOB: [birth year] DOA: 10/18/2021 PCP: System, Provider Not In   Brief Narrative:  Patient is a 81 y.o. M with CAD s/p CABG 96, ICM and sCHF EF <20%, DM, CKD IIIb baseline 1.7, pAF on Eliquis, hx PPM and BPH who presented with 3 days weakness, back pain to [facility].  Patient does intermittent catheterization at home whenever he feels like doing usually every 48 hours.  On presentation he was hyportensive.  Lab work showed leukocytosis.  Creatinine was in the range of 3.6.  CT imaging showed distended bladder, discitis/osteomyelitis of the spine.  He was transferred to [facility], ICU for pressures.  Foley was placed for urinary retention.  MRI did not show any drainable focus, pressure started off, transferred out of ICU.  Underwent disc aspiration on 6/8 by IR, cultures showed Klebsiella.  ID was consulted and recommended antibiotics.  PT/OT recommended CIR on discharge.  Because of severe volume overload, IV Lasix was started.  Continue IV Lasix for today. Ultimate plan is to discharge to CIR after adequate fluid removal  Assessment & Plan:  Principal Problem:   Septic shock ([place]) Active Problems:   Bacteremia due to Klebsiella pneumoniae   Discitis   Osteomyelitis of lumbar spine (HCC)   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HCC)   Bladder outlet obstruction   Acute kidney injury ([place])   Uncontrolled type 2 diabetes mellitus with hyperglycemia, with long-term current use of insulin (HCC)   Pacemaker   Chronic kidney disease, stage 3b (HCC)   Coronary artery disease involving native coronary artery of native heart without angina pectoris   Chronic combined systolic and diastolic CHF (congestive heart failure) (HCC)   Hyponatremia   Hypokalemia   Transaminitis   Myocardial injury   Thrombocytopenia (HCC)   Anemia due to chronic kidney disease   Coagulopathy (HCC)   Protein-calorie malnutrition, moderate (HCC)   Paroxysmal atrial fibrillation (HCC)   Foot ulcer  ([place])  Septic shock/Klebsiella bacteremia/UTI: Presented to [facility] initially with back pain, weakness.  Found to be very hypotensive.  Septic shock suspected.  Started on broad-spectrum antibiotics.  Sent to ICU at [facility] with pressor support.  Currently hemodynamically stable Found to have osteomyelitis/discitis of lumbar spine, psoas abscess.  Underwent disc space drainage by IR.  Culture resulted Klebsiella.  ID was consulted.  PICC line placed.  Recommended cefazolin till 7/18.  Sepsis physiology has resolved.  Leukocytosis is improving.  Chronic combined systolic/diastolic congestive heart failure/volume overload: Known history of systolic congestive heart failure.  EF is less than 20%, has grade 2 diastolic dysfunction.  Status post ICD placement.  Looks severely volume overloaded y along with bilateral lower extremity edema, scrotal edema.  Started on Lasix 60 mg twice daily,will continue .  Monitor input/output, daily weight.  Takes torsemide at home.  He follows with cardiology.  Also takes Entresto at home which we will resume at some point.  AKI on CKD stage IIIb: Baseline creatinine arounfd 1.8.  Presented with creatinine in the range of 3.6. last creatinine in the range of 2.2.    Continue Lasix  Coronary artery disease: No anginal symptoms.  On Lipitor, beta-blocker  Paroxysmal A-fib: Currently rate is controlled.  On Eliquis for anticoagulation, on Coreg for rate control.  Monitor on telemetry  Bladder outlet obstruction: On presentation, imaging showed distended bladder.  Takes finasteride, Flomax at home.  Does self cath at home.  Foley was placed here.  Continue Foley until IV Lasix discontinued.  Then will  check voiding trial.  Type 2 diabetes/hyperglycemia: Last hemoglobin A1c of more than 10.  Continue current insulin regimen.  Monitor blood sugars  Deconditioning/debility: PT/OT recommended CIR on discharge.    Nutrition Problem: Moderate Malnutrition Etiology: chronic illness  (CHF) Pressure Injury 11/02/21 Foot Left;Lateral Deep Tissue Pressure Injury - Purple or maroon localized area of discolored intact skin or blood-filled blister due to damage of underlying soft tissue from pressure and/or shear. 5.5x4 (Active)  11/02/21   Location: Foot  Location Orientation: Left;Lateral  Staging: Deep Tissue Pressure Injury - Purple or maroon localized area of discolored intact skin or blood-filled blister due to damage of underlying soft tissue from pressure and/or shear.  Wound Description (Comments): 5.5x4  Present on Admission: Yes  Dressing Type Gauze (Comment) 11/15/21 2300    DVT prophylaxis:Place and maintain sequential compression device Start: 11/02/21 1108 apixaban (ELIQUIS) tablet 5 mg     Code Status: Full Code  Family Communication: Called and discussed with daughter on phone on 6/15  Patient status:Inpatient  Patient is from :Home  Anticipated discharge to:CIR  Estimated DC date: After improvement in the volume overload   Consultants: PCCM, IR, ID  Procedures: Dx aspiration  Antimicrobials:  Anti-infectives (From admission, onward)    Start     Dose/Rate Route Frequency Ordered Stop   11/12/21 1800  ceFAZolin (ANCEF) IVPB 2g/100 mL premix        2 g 200 mL/hr over 30 Minutes Intravenous Every 12 hours 11/12/21 0936     11/04/21 1400  ceFAZolin (ANCEF) IVPB 2g/100 mL premix  Status:  Discontinued        2 g 200 mL/hr over 30 Minutes Intravenous Every 8 hours 11/04/21 1058 11/12/21 0936   11/03/21 1800  vancomycin (VANCOCIN) IVPB 1000 mg/200 mL premix  Status:  Discontinued        1,000 mg 200 mL/hr over 60 Minutes Intravenous Every 48 hours 11/02/21 0259 11/02/21 0452   11/02/21 2200  cefTRIAXone (ROCEPHIN) 2 g in sodium chloride 0.9 % 100 mL IVPB  Status:  Discontinued        2 g 200 mL/hr over 30 Minutes Intravenous Every 24 hours 11/02/21 0452 11/04/21 1058   11/02/21 0230  ceFEPIme (MAXIPIME) 2 g in sodium chloride 0.9 % 100 mL IVPB   Status:  Discontinued        2 g 200 mL/hr over 30 Minutes Intravenous Every 24 hours 11/02/21 0131 11/02/21 0452       Subjective:  Patient seen and examined at the bedside this morning.  Hemodynamically stable.  Overall comfortable without any complaints.  He still has significant bilateral lower EXTR edema, scrotal edema.  Feels better  Objective: Vitals:   11/15/21 2130 11/16/21 0500 11/16/21 0609 11/16/21 0759  BP: 106/64  106/61 (!) 104/58  Pulse: 83  85 88  Resp: 18  18 18   Temp: 98.9 F (37.2 C)  98.4 F (36.9 C) 99.4 F (37.4 C)  TempSrc: Oral  Oral Oral  SpO2: 98%  97% 99%  Weight:  70 kg    Height:        Intake/Output Summary (Last 24 hours) at 11/16/2021 1149 Last data filed at 11/16/2021 0900 Gross per 24 hour  Intake 360 ml  Output 2400 ml  Net -2040 ml   Filed Weights   11/04/21 0630 11/07/21 1500 11/16/21 0500  Weight: 64.2 kg 64.9 kg 70 kg    Examination:  General exam: Overall comfortable, not in distress HEENT: PERRL Respiratory system:  no wheezes or crackles  Cardiovascular system: S1 & S2 heard, RRR.  Gastrointestinal system: Abdomen is nondistended, soft and nontender. Central nervous system: Alert and oriented Extremities: Bilateral lower extremity edema, no clubbing ,no cyanosis Skin: No rashes, no ulcers,no icterus   GU: Scrotal edema, Foley   Data Reviewed: I have personally reviewed following labs and imaging studies  CBC: Recent Labs  Lab 11/16/21 0459  WBC 8.4  HGB 8.0*  HCT 25.1*  MCV 83.9  PLT 302   Basic Metabolic Panel: Recent Labs  Lab 11/11/21 0336 11/12/21 0409 11/13/21 0214 11/15/21 1300 11/16/21 0459  NA 133* 131* 131* 132* 133*  K 4.4 5.3* 4.9 4.8 4.8  CL 100 98 100 101 101  CO2 25 24 23 22 24   GLUCOSE 218* 279* 118* 207* 125*  BUN 70* 93* 102* 89* 90*  CREATININE 1.88* 2.64* 2.60* 2.22* 2.24*  CALCIUM 8.1* 8.1* 7.8* 8.2* 8.2*     Recent Results (from the past 240 hour(s))  Culture, blood  (Routine X 2) w Reflex to ID Panel     Status: None   Collection Time: 11/07/21 12:48 PM   Specimen: BLOOD  Result Value Ref Range Status   Specimen Description BLOOD BLOOD LEFT ARM  Final   Special Requests   Final    BOTTLES DRAWN AEROBIC AND ANAEROBIC Blood Culture adequate volume   Culture   Final    NO GROWTH 5 DAYS Performed at [facility], [address], [place], [place]    Report Status 11/12/2021 FINAL  Final  Culture, blood (Routine X 2) w Reflex to ID Panel     Status: None   Collection Time: 11/07/21  1:00 PM   Specimen: BLOOD  Result Value Ref Range Status   Specimen Description BLOOD BLOOD LEFT HAND  Final   Special Requests   Final    BOTTLES DRAWN AEROBIC AND ANAEROBIC Blood Culture adequate volume   Culture   Final    NO GROWTH 5 DAYS Performed at [facility], [address], [place], [place]    Report Status 11/12/2021 FINAL  Final  Aerobic/Anaerobic Culture w Gram Stain (surgical/deep wound)     Status: None   Collection Time: 11/09/21  2:47 PM   Specimen: Abscess  Result Value Ref Range Status   Specimen Description ABSCESS  Final   Special Requests INVERTEBRAL DISC  Final   Gram Stain   Final    ABUNDANT WBC PRESENT,BOTH PMN AND MONONUCLEAR NO ORGANISMS SEEN    Culture   Final    FEW KLEBSIELLA PNEUMONIAE NO ANAEROBES ISOLATED Performed at [facility], [address] [place], [place]    Report Status 11/14/2021 FINAL  Final   Organism ID, Bacteria KLEBSIELLA PNEUMONIAE  Final      Susceptibility   Klebsiella pneumoniae - MIC*    AMPICILLIN RESISTANT Resistant     CEFAZOLIN <=4 SENSITIVE Sensitive     CEFEPIME <=0.12 SENSITIVE Sensitive     CEFTAZIDIME <=1 SENSITIVE Sensitive     CEFTRIAXONE <=0.25 SENSITIVE Sensitive     CIPROFLOXACIN <=0.25 SENSITIVE Sensitive     GENTAMICIN <=1 SENSITIVE Sensitive     IMIPENEM <=0.25 SENSITIVE Sensitive     TRIMETH/SULFA <=20 SENSITIVE Sensitive      AMPICILLIN/SULBACTAM 4 SENSITIVE Sensitive     PIP/TAZO 8 SENSITIVE Sensitive     * FEW KLEBSIELLA PNEUMONIAE     Radiology Studies: No results found.  Scheduled Meds:  apixaban  5 mg Oral BID   artificial tears   Both Eyes QHS   atorvastatin  80 mg Oral QHS   carvedilol  6.25 mg Oral BID WC   chlorhexidine  15 mL Mouth Rinse BID   Chlorhexidine Gluconate Cloth  6 each Topical Q0600   feeding supplement (GLUCERNA SHAKE)  237 mL Oral TID BM   finasteride  5 mg Oral Daily   furosemide  60 mg Intravenous Q12H   insulin aspart  0-15 Units Subcutaneous TID WC   insulin aspart  0-5 Units Subcutaneous QHS   insulin detemir  18 Units Subcutaneous Daily   ketotifen  1 drop Both Eyes BID   latanoprost  1 drop Both Eyes QHS   mouth rinse  15 mL Mouth Rinse q12n4p   multivitamin with minerals  1 tablet Oral Daily   pantoprazole  40 mg Oral Q1200   polyethylene glycol  17 g Oral BID   sodium chloride flush  10-40 mL Intracatheter Q12H   sodium chloride flush  10-40 mL Intracatheter Q12H   tamsulosin  0.8 mg Oral QHS   Continuous Infusions:  sodium chloride 250 mL (11/02/21 0205)   sodium chloride 250 mL (11/12/21 1244)    ceFAZolin (ANCEF) IV 2 g (11/16/21 0859)     LOS: 15 days   Shelly Coss, MD [facility] P6/15/2023, 11:49 AM

## 2021-11-16 NOTE — Progress Notes (Signed)
Physical Therapy Treatment Patient Details Name: Glenn [name] MRN: [id number] DOB: [birth year] Today's Date: 11/16/2021   History of Present Illness Patient is a 81 y/o male who presents on 5/31 with generalized weakness and falls. 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 PMH includes DM, HTN, MI, CAD s/p CABG, recent PPM placement and DCCV for Afib, CHF, CKD, BPH. L2-L3 disc aspiration planned for 11/09/21.    PT Comments    Patient continues to be limited by decreased activity tolerance. Patient complaining of scrotal pain throughout session. Ambulated 16' + 2' with Rw and minA with chair follow. Encouraged continued mobility to build endurance. Continue to recommend acute inpatient rehab (AIR) for post-acute therapy needs.    Recommendations for follow up therapy are one component of a multi-disciplinary discharge planning process, led by the attending physician.  Recommendations may be updated based on patient status, additional functional criteria and insurance authorization.  Follow Up Recommendations  Acute inpatient rehab (3hours/day)     Assistance Recommended at Discharge Frequent or constant Supervision/Assistance  Patient can return home with the following A little help with walking and/or transfers;A little help with bathing/dressing/bathroom;Assistance with cooking/housework;Assist for transportation;Help with stairs or ramp for entrance   Equipment Recommendations  None recommended by PT    Recommendations for Other Services       Precautions / Restrictions Precautions Precautions: Fall Restrictions Weight Bearing Restrictions: No     Mobility  Bed Mobility               General bed mobility comments: up in chair on arrival    Transfers Overall transfer level: Needs assistance Equipment used: Rolling Glenn [name] (2 wheels) Transfers: Sit to/from  Stand Sit to Stand: Mod assist, +2 safety/equipment           General transfer comment: modA+2 to stand from recliner x 2 during session. Cues for hand placement and scooting towards edge of chair prior to standing    Ambulation/Gait Ambulation/Gait assistance: Min assist, +2 safety/equipment Gait Distance (Feet): 12 Feet (+2') Assistive device: Rolling Glenn [name] (2 wheels) Gait Pattern/deviations: Step-to pattern, Shuffle, Decreased stride length, Trunk flexed, Narrow base of support Gait velocity: decreased     General Gait Details: short shuffle steps. MinA for balance and RW management. +2 for chair follow. Trialed 2 bouts of ambulation but on second bout, unable to progress beyond 2'   Stairs             Wheelchair Mobility    Modified Rankin (Stroke Patients Only)       Balance Overall balance assessment: Needs assistance Sitting-balance support: No upper extremity supported, Feet supported Sitting balance-Glenn [name]: Fair     Standing balance support: Reliant on assistive device for balance, Bilateral upper extremity supported Standing balance-Glenn [name]: Poor Standing balance comment: reliant on RW for support                            Cognition Arousal/Alertness: Awake/alert Behavior During Therapy: WFL for tasks assessed/performed Overall Cognitive Status: Within Functional Limits for tasks assessed                                          Exercises Other Exercises Other Exercises: assisted with elevating scrotum with use of pillow case  General Comments        Pertinent Vitals/Pain Pain Assessment Pain Assessment: Faces Faces Pain [name]: Hurts even more Pain Location: scrotum, back Pain Descriptors / Indicators: Discomfort, Grimacing Pain Intervention(s): Monitored during session, Repositioned    Home Living                          Prior Function            PT Goals (current goals can now  be found in the care plan section) Acute Rehab PT Goals Patient Stated Goal: to go home PT Goal Formulation: With patient Time For Goal Achievement: 11/20/21 Potential to Achieve Goals: Good Progress towards PT goals: Progressing toward goals    Frequency    Min 3X/week      PT Plan Current plan remains appropriate    Co-evaluation              AM-PAC PT "6 Clicks" Mobility   Outcome Measure  Help needed turning from your back to your side while in a flat bed without using bedrails?: A Little Help needed moving from lying on your back to sitting on the side of a flat bed without using bedrails?: A Little Help needed moving to and from a bed to a chair (including a wheelchair)?: A Little Help needed standing up from a chair using your arms (e.g., wheelchair or bedside chair)?: Total Help needed to walk in hospital room?: Total Help needed climbing 3-5 steps with a railing? : Total 6 Click Score: 12    End of Session Equipment Utilized During Treatment: Gait belt Activity Tolerance: Patient tolerated treatment well Patient left: in chair;with call bell/phone within reach Nurse Communication: Mobility status PT Visit Diagnosis: Unsteadiness on feet (R26.81);Other abnormalities of gait and mobility (R26.89);Muscle weakness (generalized) (M62.81);Pain     Time: 1347-1401 PT Time Calculation (min) (ACUTE ONLY): 14 min  Charges:  $Gait Training: 8-22 mins                     Madsen Riddle A. Dan Humphreys PT, DPT Acute Rehabilitation Services Office [phone]    Glenn [name] 11/16/2021, 3:04 PM

## 2021-11-16 NOTE — Progress Notes (Addendum)
Inpatient Rehab Admissions Coordinator:   Continue to await medical clearance for CIR admit.  Will follow.   1145: I updated pt at bedside.   Estill Dooms, PT, DPT Admissions Coordinator [phone] 11/16/21  10:01 AM

## 2021-11-17 DIAGNOSIS — R6521 Severe sepsis with septic shock: Secondary | ICD-10-CM | POA: Diagnosis not present

## 2021-11-17 DIAGNOSIS — A419 Sepsis, unspecified organism: Secondary | ICD-10-CM | POA: Diagnosis not present

## 2021-11-17 LAB — BASIC METABOLIC PANEL
Anion gap: 7 (ref 5–15)
BUN: 84 mg/dL — ABNORMAL HIGH (ref 8–23)
CO2: 25 mmol/L (ref 22–32)
Calcium: 8.1 mg/dL — ABNORMAL LOW (ref 8.9–10.3)
Chloride: 102 mmol/L (ref 98–111)
Creatinine, Ser: 2.07 mg/dL — ABNORMAL HIGH (ref 0.61–1.24)
GFR, Estimated: 32 mL/min — ABNORMAL LOW (ref >60)
Glucose, Bld: 58 mg/dL — ABNORMAL LOW (ref 70–99)
Potassium: 4.3 mmol/L (ref 3.5–5.1)
Sodium: 134 mmol/L — ABNORMAL LOW (ref 135–145)

## 2021-11-17 LAB — GLUCOSE, CAPILLARY
Glucose-Capillary: 70 mg/dL (ref 70–99)
Glucose-Capillary: 81 mg/dL (ref 70–99)
Glucose-Capillary: 111 mg/dL — ABNORMAL HIGH (ref 70–99)
Glucose-Capillary: 157 mg/dL — ABNORMAL HIGH (ref 70–99)

## 2021-11-17 MED ORDER — FUROSEMIDE 10 MG/ML IJ SOLN
80.0000 mg | Freq: Two times a day (BID) | INTRAMUSCULAR | Status: DC
Start: 2021-11-17 — End: 2021-11-21
  Administered 2021-11-17 – 2021-11-21 (×8): 80 mg via INTRAVENOUS
  Filled 2021-11-17 (×8): qty 8

## 2021-11-17 NOTE — Inpatient Diabetes Management (Signed)
Inpatient Diabetes Program Recommendations  AACE/ADA: New Consensus Statement on Inpatient Glycemic Control   Target Ranges:  Prepandial:   less than 140 mg/dL      Peak postprandial:   less than 180 mg/dL (1-2 hours)      Critically ill patients:  140 - 180 mg/dL    Latest Reference Range & Units 11/17/21 07:30 11/17/21 12:05  Glucose-Capillary 70 - 99 mg/dL 70 81    Latest Reference Range & Units 11/17/21 04:37  Glucose 70 - 99 mg/dL 58 (L)    Latest Reference Range & Units 11/16/21 08:02 11/16/21 11:16 11/16/21 16:29 11/16/21 21:23  Glucose-Capillary 70 - 99 mg/dL 080 (H) 223 (H) 361 (H) 138 (H)   Review of Glycemic Control  Current orders for Inpatient glycemic control: Levemir 18 units daily, Novolog 0-15 units TID with meals, Novolog 0-5 units QHS  Inpatient Diabetes Program Recommendations:    Insulin: Lab glucose 58 mg/dl today and finger stick CBG 70 mg/dl at 2:24 am today.  Please consider decreasing Levemir to 15 units daily.  Thanks, Orlando Penner, RN, MSN, CDCES Diabetes Coordinator Inpatient Diabetes Program [phone] (Team Pager from 8am to 5pm)

## 2021-11-17 NOTE — Plan of Care (Signed)

## 2021-11-17 NOTE — Progress Notes (Signed)
PROGRESS NOTE  Glenn [name]  XBD:[id number] DOB: 1940/09/19 DOA: 10/09/2021 PCP: System, Provider Not In   Brief Narrative:  Patient is a 81 y.o. M with CAD s/p CABG 96, ICM and sCHF EF <20%, DM, CKD IIIb baseline 1.7, pAF on Eliquis, hx PPM and BPH who presented with 3 days weakness, back pain to [facility]  Patient does intermittent catheterization at home whenever he feels like doing usually every 48 hours.  On presentation he was hyportensive.  Lab work showed leukocytosis.  Creatinine was in the range of 3.6.  CT imaging showed distended bladder, discitis/osteomyelitis of the spine.  He was transferred to [facility], ICU for pressures.  Foley was placed for urinary retention.  MRI did not show any drainable focus, pressure started off, transferred out of ICU.  Underwent disc aspiration on 6/8 by IR, cultures showed Klebsiella.  ID was consulted and recommended antibiotics.  PT/OT recommended CIR on discharge.  Because of severe volume overload, IV Lasix was started.  Continue IV Lasix for today. Ultimate plan is to discharge to CIR after adequate fluid removal  Assessment & Plan:  Principal Problem:   Septic shock (HCC) Active Problems:   Bacteremia due to Klebsiella pneumoniae   Discitis   Osteomyelitis of lumbar spine (HCC)   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HCC)   Bladder outlet obstruction   Acute kidney injury (HCC)   Uncontrolled type 2 diabetes mellitus with hyperglycemia, with long-term current use of insulin (HCC)   Pacemaker   Chronic kidney disease, stage 3b (HCC)   Coronary artery disease involving native coronary artery of native heart without angina pectoris   Chronic combined systolic and diastolic CHF (congestive heart failure) (HCC)   Hyponatremia   Hypokalemia   Transaminitis   Myocardial injury   Thrombocytopenia (HCC)   Anemia due to chronic kidney disease   Coagulopathy (HCC)   Protein-calorie malnutrition, moderate (HCC)   Paroxysmal atrial fibrillation (HCC)   Foot ulcer  (HCC)  Septic shock/Klebsiella bacteremia/UTI: Presented to [facility] initially with back pain, weakness.  Found to be very hypotensive.  Septic shock suspected.  Started on broad-spectrum antibiotics.  Sent to ICU at [facility] with pressor support.  Currently hemodynamically stable Found to have osteomyelitis/discitis of lumbar spine, psoas abscess.  Underwent disc space drainage by IR.  Culture resulted Klebsiella.  ID was consulted.  PICC line placed.  Recommended cefazolin till 7/18.  Sepsis physiology has resolved.  Leukocytosis is improving.  Chronic combined systolic/diastolic congestive heart failure/volume overload: Known history of systolic congestive heart failure.  EF is less than 20%, has grade 2 diastolic dysfunction.  Status post ICD placement.  Looks severely volume overloaded y along with bilateral lower extremity edema, scrotal edema. Increased  Lasix  to 80 mg twice daily,will continue .  Monitor input/output, daily weight.  Takes torsemide at home.  He follows with cardiology.  Also takes Entresto at home which we will resume at some point.  AKI on CKD stage IIIb: Baseline creatinine arounfd 1.8.  Presented with creatinine in the range of 3.6. last creatinine in the range of 2.2.    Continue Lasix  Coronary artery disease: No anginal symptoms.  On Lipitor, beta-blocker  Paroxysmal A-fib: Currently rate is controlled.  On Eliquis for anticoagulation, on Coreg for rate control.  Monitor on telemetry  Bladder outlet obstruction: On presentation, imaging showed distended bladder.  Takes finasteride, Flomax at home.  Does self cath at home.  Foley was placed here.  Continue Foley until IV Lasix discontinued.  Then  will check voiding trial.  Type 2 diabetes/hyperglycemia: Last hemoglobin A1c of more than 10.  Continue current insulin regimen.  Monitor blood sugars  Deconditioning/debility: PT/OT recommended CIR on discharge.    Nutrition Problem: Moderate Malnutrition Etiology: chronic  illness (CHF) Pressure Injury 11/02/21 Foot Left;Lateral Deep Tissue Pressure Injury - Purple or maroon localized area of discolored intact skin or blood-filled blister due to damage of underlying soft tissue from pressure and/or shear. 5.5x4 (Active)  11/02/21   Location: Foot  Location Orientation: Left;Lateral  Staging: Deep Tissue Pressure Injury - Purple or maroon localized area of discolored intact skin or blood-filled blister due to damage of underlying soft tissue from pressure and/or shear.  Wound Description (Comments): 5.5x4  Present on Admission: Yes  Dressing Type Gauze (Comment) 11/16/21 2300    DVT prophylaxis:Place and maintain sequential compression device Start: 11/02/21 1108 apixaban (ELIQUIS) tablet 5 mg     Code Status: Full Code  Family Communication: Called and discussed with daughter on phone on 6/15  Patient status:Inpatient  Patient is from :Home  Anticipated discharge to:CIR  Estimated DC date: After improvement in the volume overload   Consultants: PCCM, IR, ID  Procedures: Dx aspiration  Antimicrobials:  Anti-infectives (From admission, onward)    Start     Dose/Rate Route Frequency Ordered Stop   11/12/21 1800  ceFAZolin (ANCEF) IVPB 2g/100 mL premix        2 g 200 mL/hr over 30 Minutes Intravenous Every 12 hours 11/12/21 0936     11/04/21 1400  ceFAZolin (ANCEF) IVPB 2g/100 mL premix  Status:  Discontinued        2 g 200 mL/hr over 30 Minutes Intravenous Every 8 hours 11/04/21 1058 11/12/21 0936   11/03/21 1800  vancomycin (VANCOCIN) IVPB 1000 mg/200 mL premix  Status:  Discontinued        1,000 mg 200 mL/hr over 60 Minutes Intravenous Every 48 hours 11/02/21 0259 11/02/21 0452   11/02/21 2200  cefTRIAXone (ROCEPHIN) 2 g in sodium chloride 0.9 % 100 mL IVPB  Status:  Discontinued        2 g 200 mL/hr over 30 Minutes Intravenous Every 24 hours 11/02/21 0452 11/04/21 1058   11/02/21 0230  ceFEPIme (MAXIPIME) 2 g in sodium chloride 0.9 % 100  mL IVPB  Status:  Discontinued        2 g 200 mL/hr over 30 Minutes Intravenous Every 24 hours 11/02/21 0131 11/02/21 0452       Subjective:  Patient seen and examined the bedside this morning.  Hemodynamically stable.  Still looks volume overloaded with bilateral lower extremity edema and scrotal edema but they are getting better.  Continue IV Lasix.  Denies any complaints  Objective: Vitals:   11/16/21 1630 11/16/21 2123 11/17/21 0528 11/17/21 0949  BP: 115/63 113/66 104/69 108/60  Pulse: 81 78 79 83  Resp: 18 16 18 17   Temp: 98.2 F (36.8 C) 97.7 F (36.5 C) 98 F (36.7 C)   TempSrc:  Oral    SpO2: 100% 100% 97% 97%  Weight:   69.3 kg   Height:        Intake/Output Summary (Last 24 hours) at 11/17/2021 1135 Last data filed at 11/17/2021 0840 Gross per 24 hour  Intake 650 ml  Output 2000 ml  Net -1350 ml   Filed Weights   11/07/21 1500 11/16/21 0500 11/17/21 0528  Weight: 64.9 kg 70 kg 69.3 kg    Examination:  General exam: Overall comfortable, not in  distress HEENT: PERRL Respiratory system:  no wheezes or crackles  Cardiovascular system: S1 & S2 heard, RRR.  Gastrointestinal system: Abdomen is nondistended, soft and nontender. Central nervous system: Alert and oriented Extremities: 2-3+ bilateral lower extremity pitting edema, no clubbing ,no cyanosis Skin: No rashes, no ulcers,no icterus   GU: Foley, scrotal edema   Data Reviewed: I have personally reviewed following labs and imaging studies  CBC: Recent Labs  Lab 11/16/21 0459  WBC 8.4  HGB 8.0*  HCT 25.1*  MCV 83.9  PLT 302   Basic Metabolic Panel: Recent Labs  Lab 11/12/21 0409 11/13/21 0214 11/15/21 1300 11/16/21 0459 11/17/21 0437  NA 131* 131* 132* 133* 134*  K 5.3* 4.9 4.8 4.8 4.3  CL 98 100 101 101 102  CO2 24 23 22 24 25   GLUCOSE 279* 118* 207* 125* 58*  BUN 93* 102* 89* 90* 84*  CREATININE 2.64* 2.60* 2.22* 2.24* 2.07*  CALCIUM 8.1* 7.8* 8.2* 8.2* 8.1*     Recent Results  (from the past 240 hour(s))  Culture, blood (Routine X 2) w Reflex to ID Panel     Status: None   Collection Time: 11/07/21 12:48 PM   Specimen: BLOOD  Result Value Ref Range Status   Specimen Description BLOOD BLOOD LEFT ARM  Final   Special Requests   Final    BOTTLES DRAWN AEROBIC AND ANAEROBIC Blood Culture adequate volume   Culture   Final    NO GROWTH 5 DAYS Performed at [facility], [address] [place], [place]    Report Status 11/12/2021 FINAL  Final  Culture, blood (Routine X 2) w Reflex to ID Panel     Status: None   Collection Time: 11/07/21  1:00 PM   Specimen: BLOOD  Result Value Ref Range Status   Specimen Description BLOOD BLOOD LEFT HAND  Final   Special Requests   Final    BOTTLES DRAWN AEROBIC AND ANAEROBIC Blood Culture adequate volume   Culture   Final    NO GROWTH 5 DAYS Performed at [facility], [address], [place], [place]    Report Status 11/12/2021 FINAL  Final  Aerobic/Anaerobic Culture w Gram Stain (surgical/deep wound)     Status: None   Collection Time: 11/09/21  2:47 PM   Specimen: Abscess  Result Value Ref Range Status   Specimen Description ABSCESS  Final   Special Requests INVERTEBRAL DISC  Final   Gram Stain   Final    ABUNDANT WBC PRESENT,BOTH PMN AND MONONUCLEAR NO ORGANISMS SEEN    Culture   Final    FEW KLEBSIELLA PNEUMONIAE NO ANAEROBES ISOLATED Performed at [facility], [address] [place], [place]    Report Status 11/14/2021 FINAL  Final   Organism ID, Bacteria KLEBSIELLA PNEUMONIAE  Final      Susceptibility   Klebsiella pneumoniae - MIC*    AMPICILLIN RESISTANT Resistant     CEFAZOLIN <=4 SENSITIVE Sensitive     CEFEPIME <=0.12 SENSITIVE Sensitive     CEFTAZIDIME <=1 SENSITIVE Sensitive     CEFTRIAXONE <=0.25 SENSITIVE Sensitive     CIPROFLOXACIN <=0.25 SENSITIVE Sensitive     GENTAMICIN <=1 SENSITIVE Sensitive     IMIPENEM <=0.25 SENSITIVE Sensitive      TRIMETH/SULFA <=20 SENSITIVE Sensitive     AMPICILLIN/SULBACTAM 4 SENSITIVE Sensitive     PIP/TAZO 8 SENSITIVE Sensitive     * FEW KLEBSIELLA PNEUMONIAE  Radiology Studies: No results found.  Scheduled Meds:  apixaban  5 mg Oral BID   artificial tears   Both Eyes QHS   atorvastatin  80 mg Oral QHS   carvedilol  6.25 mg Oral BID WC   chlorhexidine  15 mL Mouth Rinse BID   Chlorhexidine Gluconate Cloth  6 each Topical Q0600   feeding supplement (GLUCERNA SHAKE)  237 mL Oral TID BM   finasteride  5 mg Oral Daily   furosemide  80 mg Intravenous Q12H   insulin aspart  0-15 Units Subcutaneous TID WC   insulin aspart  0-5 Units Subcutaneous QHS   insulin detemir  18 Units Subcutaneous Daily   ketotifen  1 drop Both Eyes BID   latanoprost  1 drop Both Eyes QHS   mouth rinse  15 mL Mouth Rinse q12n4p   multivitamin with minerals  1 tablet Oral Daily   pantoprazole  40 mg Oral Q1200   polyethylene glycol  17 g Oral BID   sodium chloride flush  10-40 mL Intracatheter Q12H   sodium chloride flush  10-40 mL Intracatheter Q12H   tamsulosin  0.8 mg Oral QHS   Continuous Infusions:  sodium chloride 250 mL (11/02/21 0205)   sodium chloride 250 mL (11/12/21 1244)    ceFAZolin (ANCEF) IV 2 g (11/17/21 0844)     LOS: 16 days   Shelly Coss, MD [facility] P6/16/2023, 11:35 AM

## 2021-11-17 NOTE — Progress Notes (Signed)
Inpatient Rehab Admissions Coordinator:   Touched base with Dr. Renford Dills.  Continues to require IV lasix for diuresis.  We will continue to follow for admission to CIR when ready and bed available.    Estill Dooms, PT, DPT Admissions Coordinator [phone] 11/17/21  10:48 AM

## 2021-11-18 DIAGNOSIS — R6521 Severe sepsis with septic shock: Secondary | ICD-10-CM | POA: Diagnosis not present

## 2021-11-18 DIAGNOSIS — A419 Sepsis, unspecified organism: Secondary | ICD-10-CM | POA: Diagnosis not present

## 2021-11-18 LAB — BASIC METABOLIC PANEL
Anion gap: 8 (ref 5–15)
BUN: 80 mg/dL — ABNORMAL HIGH (ref 8–23)
CO2: 27 mmol/L (ref 22–32)
Calcium: 8.2 mg/dL — ABNORMAL LOW (ref 8.9–10.3)
Chloride: 100 mmol/L (ref 98–111)
Creatinine, Ser: 2.07 mg/dL — ABNORMAL HIGH (ref 0.61–1.24)
GFR, Estimated: 32 mL/min — ABNORMAL LOW (ref >60)
Glucose, Bld: 131 mg/dL — ABNORMAL HIGH (ref 70–99)
Potassium: 4.4 mmol/L (ref 3.5–5.1)
Sodium: 135 mmol/L (ref 135–145)

## 2021-11-18 LAB — GLUCOSE, CAPILLARY
Glucose-Capillary: 121 mg/dL — ABNORMAL HIGH (ref 70–99)
Glucose-Capillary: 263 mg/dL — ABNORMAL HIGH (ref 70–99)
Glucose-Capillary: 207 mg/dL — ABNORMAL HIGH (ref 70–99)
Glucose-Capillary: 90 mg/dL (ref 70–99)

## 2021-11-18 MED ORDER — INSULIN DETEMIR 100 UNIT/ML ~~LOC~~ SOLN
15.0000 [IU] | Freq: Every day | SUBCUTANEOUS | Status: DC
  Administered 2021-11-19 – 2021-12-04 (×16): 15 [IU] via SUBCUTANEOUS
  Filled 2021-11-18 (×18): qty 0.15

## 2021-11-18 NOTE — Progress Notes (Signed)
PROGRESS NOTE  Glenn [name]  WEX:[id number] DOB: 09/22/1940 DOA: 2021-11-30 PCP: System, Provider Not In   Brief Narrative:  Patient is a 81 y.o. M with CAD s/p CABG 96, ICM and sCHF EF <20%, DM, CKD IIIb baseline 1.7, pAF on Eliquis, hx PPM and BPH who presented with 3 days weakness, back pain to [facility].  Patient does intermittent catheterization at home whenever he feels like doing usually every 48 hours.  On presentation he was hyportensive.  Lab work showed leukocytosis.  Creatinine was in the range of 3.6.  CT imaging showed distended bladder, discitis/osteomyelitis of the spine.  He was transferred to [facility], ICU for pressures.  Foley was placed for urinary retention.  MRI did not show any drainable focus, pressure started off, transferred out of ICU.  Underwent disc aspiration on 6/8 by IR, cultures showed Klebsiella.  ID was consulted and recommended antibiotics.  PT/OT recommended CIR on discharge.  Because of severe volume overload, IV Lasix was started.  Still looks volume overloaded.  Continue IV Lasix for today. Ultimate plan is to discharge to CIR after adequate fluid removal  Assessment & Plan:  Principal Problem:   Septic shock (HCC) Active Problems:   Bacteremia due to Klebsiella pneumoniae   Discitis   Osteomyelitis of lumbar spine (HCC)   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HCC)   Bladder outlet obstruction   Acute kidney injury (HCC)   Uncontrolled type 2 diabetes mellitus with hyperglycemia, with long-term current use of insulin (HCC)   Pacemaker   Chronic kidney disease, stage 3b (HCC)   Coronary artery disease involving native coronary artery of native heart without angina pectoris   Chronic combined systolic and diastolic CHF (congestive heart failure) (HCC)   Hyponatremia   Hypokalemia   Transaminitis   Myocardial injury   Thrombocytopenia (HCC)   Anemia due to chronic kidney disease   Coagulopathy (HCC)   Protein-calorie malnutrition, moderate (HCC)   Paroxysmal atrial  fibrillation (HCC)   Foot ulcer (HCC)  Septic shock/Klebsiella bacteremia/UTI: Presented to [facility] initially with back pain, weakness.  Found to be very hypotensive.  Septic shock suspected.  Started on broad-spectrum antibiotics.  Sent to ICU at [facility] with pressor support.  Currently hemodynamically stable Found to have osteomyelitis/discitis of lumbar spine, psoas abscess.  Underwent disc space drainage by IR.  Culture resulted Klebsiella.  ID was consulted.  PICC line placed.  Recommended cefazolin till 7/18.  Sepsis physiology has resolved.  Leukocytosis is improving.  Chronic combined systolic/diastolic congestive heart failure/volume overload: Known history of systolic congestive heart failure.  EF is less than 20%, has grade 2 diastolic dysfunction.  Status post ICD placement.  Looks severely volume overloaded y along with bilateral lower extremity edema, scrotal edema. Increased  Lasix  to 80 mg twice daily,will continue .  Monitor input/output, daily weight.  Takes torsemide at home.  He follows with cardiology.  Also takes Entresto at home which we will resume at some point.  AKI on CKD stage IIIb: Baseline creatinine arounfd 1.8.  Presented with creatinine in the range of 3.6. last creatinine in the range of 2.2.    Continue Lasix  Coronary artery disease: No anginal symptoms.  On Lipitor, beta-blocker  Paroxysmal A-fib: Currently rate is controlled.  On Eliquis for anticoagulation, on Coreg for rate control.  Monitor on telemetry  Bladder outlet obstruction: On presentation, imaging showed distended bladder.  Takes finasteride, Flomax at home.  Does self cath at home.  Foley was placed here.  Continue Foley until  IV Lasix discontinued.  Then will check voiding trial.  Type 2 diabetes/hyperglycemia: Last hemoglobin A1c of more than 10.  Continue current insulin regimen.  Monitor blood sugars  Deconditioning/debility: PT/OT recommended CIR on discharge.    Nutrition Problem: Moderate  Malnutrition Etiology: chronic illness (CHF) Pressure Injury 11/02/21 Foot Left;Lateral Deep Tissue Pressure Injury - Purple or maroon localized area of discolored intact skin or blood-filled blister due to damage of underlying soft tissue from pressure and/or shear. 5.5x4 (Active)  11/02/21   Location: Foot  Location Orientation: Left;Lateral  Staging: Deep Tissue Pressure Injury - Purple or maroon localized area of discolored intact skin or blood-filled blister due to damage of underlying soft tissue from pressure and/or shear.  Wound Description (Comments): 5.5x4  Present on Admission: Yes  Dressing Type Gauze (Comment) 11/18/21 0300    DVT prophylaxis:Place and maintain sequential compression device Start: 11/02/21 1108 apixaban (ELIQUIS) tablet 5 mg     Code Status: Full Code  Family Communication: Called and discussed with daughter on phone on 6/15  Patient status:Inpatient  Patient is from :Home  Anticipated discharge to:CIR  Estimated DC date: After improvement in the volume overload   Consultants: PCCM, IR, ID  Procedures: Dx aspiration  Antimicrobials:  Anti-infectives (From admission, onward)    Start     Dose/Rate Route Frequency Ordered Stop   11/12/21 1800  ceFAZolin (ANCEF) IVPB 2g/100 mL premix        2 g 200 mL/hr over 30 Minutes Intravenous Every 12 hours 11/12/21 0936     11/04/21 1400  ceFAZolin (ANCEF) IVPB 2g/100 mL premix  Status:  Discontinued        2 g 200 mL/hr over 30 Minutes Intravenous Every 8 hours 11/04/21 1058 11/12/21 0936   11/03/21 1800  vancomycin (VANCOCIN) IVPB 1000 mg/200 mL premix  Status:  Discontinued        1,000 mg 200 mL/hr over 60 Minutes Intravenous Every 48 hours 11/02/21 0259 11/02/21 0452   11/02/21 2200  cefTRIAXone (ROCEPHIN) 2 g in sodium chloride 0.9 % 100 mL IVPB  Status:  Discontinued        2 g 200 mL/hr over 30 Minutes Intravenous Every 24 hours 11/02/21 0452 11/04/21 1058   11/02/21 0230  ceFEPIme (MAXIPIME) 2  g in sodium chloride 0.9 % 100 mL IVPB  Status:  Discontinued        2 g 200 mL/hr over 30 Minutes Intravenous Every 24 hours 11/02/21 0131 11/02/21 0452       Subjective:  Patient seen and examined at the bedside this morning.  Hemodynamically stable.  His bilateral lower extremity edema has improved today but he still has persistent scrotal edema.  Plan to continue IV Lasix for now.  Objective: Vitals:   11/17/21 1749 11/17/21 2058 11/18/21 0600 11/18/21 0915  BP: 105/64 115/67 107/62 114/67  Pulse: 89 94 80 87  Resp: 18 18 18 18   Temp: 99 F (37.2 C) 99.6 F (37.6 C) 98.3 F (36.8 C) 98.7 F (37.1 C)  TempSrc: Oral Oral Oral Oral  SpO2: 98% 98% 99% 100%  Weight:   69.3 kg   Height:        Intake/Output Summary (Last 24 hours) at 11/18/2021 1112 Last data filed at 11/18/2021 0900 Gross per 24 hour  Intake 240 ml  Output 3625 ml  Net -3385 ml   Filed Weights   11/16/21 0500 11/17/21 0528 11/18/21 0600  Weight: 70 kg 69.3 kg 69.3 kg    Examination:  General exam: Overall comfortable, not in distress, very deconditioned, pleasant elderly male HEENT: PERRL Respiratory system:  no wheezes or crackles  Cardiovascular system: S1 & S2 heard, RRR.  Gastrointestinal system: Abdomen is nondistended, soft and nontender. Central nervous system: Alert and oriented Extremities: 1+ bilateral lower extremity pitting edema, no clubbing ,no cyanosis Skin: No rashes, no ulcers,no icterus   GU: Foley, scrotal edema   Data Reviewed: I have personally reviewed following labs and imaging studies  CBC: Recent Labs  Lab 11/16/21 0459  WBC 8.4  HGB 8.0*  HCT 25.1*  MCV 83.9  PLT [id number]   Basic Metabolic Panel: Recent Labs  Lab 11/13/21 0214 11/15/21 1300 11/16/21 0459 11/17/21 0437 11/18/21 0407  NA 131* 132* 133* 134* 135  K 4.9 4.8 4.8 4.3 4.4  CL 100 101 101 102 100  CO2 23 22 24 25 27   GLUCOSE 118* 207* 125* 58* 131*  BUN 102* 89* 90* 84* 80*  CREATININE 2.60* 2.22*  2.24* 2.07* 2.07*  CALCIUM 7.8* 8.2* 8.2* 8.1* 8.2*     Recent Results (from the past 240 hour(s))  Aerobic/Anaerobic Culture w Gram Stain (surgical/deep wound)     Status: None   Collection Time: 11/09/21  2:47 PM   Specimen: Abscess  Result Value Ref Range Status   Specimen Description ABSCESS  Final   Special Requests INVERTEBRAL DISC  Final   Gram Stain   Final    ABUNDANT WBC PRESENT,BOTH PMN AND MONONUCLEAR NO ORGANISMS SEEN    Culture   Final    FEW KLEBSIELLA PNEUMONIAE NO ANAEROBES ISOLATED Performed at [facility], [address] [place], [place] [address]    Report Status 11/14/2021 FINAL  Final   Organism ID, Bacteria KLEBSIELLA PNEUMONIAE  Final      Susceptibility   Klebsiella pneumoniae - MIC*    AMPICILLIN RESISTANT Resistant     CEFAZOLIN <=4 SENSITIVE Sensitive     CEFEPIME <=0.12 SENSITIVE Sensitive     CEFTAZIDIME <=1 SENSITIVE Sensitive     CEFTRIAXONE <=0.25 SENSITIVE Sensitive     CIPROFLOXACIN <=0.25 SENSITIVE Sensitive     GENTAMICIN <=1 SENSITIVE Sensitive     IMIPENEM <=0.25 SENSITIVE Sensitive     TRIMETH/SULFA <=20 SENSITIVE Sensitive     AMPICILLIN/SULBACTAM 4 SENSITIVE Sensitive     PIP/TAZO 8 SENSITIVE Sensitive     * FEW KLEBSIELLA PNEUMONIAE     Radiology Studies: No results found.  Scheduled Meds:  apixaban  5 mg Oral BID   artificial tears   Both Eyes QHS   atorvastatin  80 mg Oral QHS   carvedilol  6.25 mg Oral BID WC   chlorhexidine  15 mL Mouth Rinse BID   Chlorhexidine Gluconate Cloth  6 each Topical Q0600   finasteride  5 mg Oral Daily   furosemide  80 mg Intravenous Q12H   insulin aspart  0-15 Units Subcutaneous TID WC   insulin aspart  0-5 Units Subcutaneous QHS   [START ON 11/19/2021] insulin detemir  15 Units Subcutaneous Daily   ketotifen  1 drop Both Eyes BID   latanoprost  1 drop Both Eyes QHS   mouth rinse  15 mL Mouth Rinse q12n4p   multivitamin with minerals  1 tablet Oral Daily   pantoprazole  40 mg  Oral Q1200   polyethylene glycol  17 g Oral BID   sodium chloride flush  10-40 mL Intracatheter Q12H   sodium chloride flush  10-40 mL Intracatheter Q12H   tamsulosin  0.8 mg Oral QHS   Continuous Infusions:  sodium chloride 250 mL (11/02/21 0205)   sodium chloride 250 mL (11/12/21 1244)    ceFAZolin (ANCEF) IV 2 g (11/18/21 MO:[id number])     LOS: 17 days   Shelly Coss, MD [facility] P6/17/2023, 11:12 AM

## 2021-11-19 DIAGNOSIS — A419 Sepsis, unspecified organism: Secondary | ICD-10-CM | POA: Diagnosis not present

## 2021-11-19 DIAGNOSIS — R6521 Severe sepsis with septic shock: Secondary | ICD-10-CM | POA: Diagnosis not present

## 2021-11-19 LAB — BASIC METABOLIC PANEL
Anion gap: 10 (ref 5–15)
BUN: 81 mg/dL — ABNORMAL HIGH (ref 8–23)
CO2: 28 mmol/L (ref 22–32)
Calcium: 8.3 mg/dL — ABNORMAL LOW (ref 8.9–10.3)
Chloride: 98 mmol/L (ref 98–111)
Creatinine, Ser: 1.95 mg/dL — ABNORMAL HIGH (ref 0.61–1.24)
GFR, Estimated: 35 mL/min — ABNORMAL LOW (ref >60)
Glucose, Bld: 126 mg/dL — ABNORMAL HIGH (ref 70–99)
Potassium: 4.3 mmol/L (ref 3.5–5.1)
Sodium: 136 mmol/L (ref 135–145)

## 2021-11-19 LAB — GLUCOSE, CAPILLARY
Glucose-Capillary: 156 mg/dL — ABNORMAL HIGH (ref 70–99)
Glucose-Capillary: 246 mg/dL — ABNORMAL HIGH (ref 70–99)
Glucose-Capillary: 181 mg/dL — ABNORMAL HIGH (ref 70–99)
Glucose-Capillary: 157 mg/dL — ABNORMAL HIGH (ref 70–99)

## 2021-11-19 NOTE — Progress Notes (Signed)
Occupational Therapy Treatment Patient Details Name: Glenn [name] MRN: [id number] DOB: 07-Nov-1940 Today's Date: 11/19/2021   History of present illness Patient is a 81 y/o male who presents on 5/31 with generalized weakness and falls. 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 PMH includes DM, HTN, MI, CAD s/p CABG, recent PPM placement and DCCV for Afib, CHF, CKD, BPH. L2-L3 disc aspiration planned for 11/09/21.   OT comments  Pt with good participation with OT.  Pt very willing to participate   Recommendations for follow up therapy are one component of a multi-disciplinary discharge planning process, led by the attending physician.  Recommendations may be updated based on patient status, additional functional criteria and insurance authorization.    Follow Up Recommendations  Acute inpatient rehab (3hours/day)       Patient can return home with the following  A little help with walking and/or transfers;A little help with bathing/dressing/bathroom;Assistance with cooking/housework;Direct supervision/assist for medications management;Direct supervision/assist for financial management;Assist for transportation         Precautions / Restrictions Precautions Precautions: None       Mobility Bed Mobility Overal bed mobility: Needs Assistance Bed Mobility: Supine to Sit Rolling: Min assist              Transfers Overall transfer level: Needs assistance Equipment used: 2 person hand held assist Transfers: Bed to chair/wheelchair/BSC, Sit to/from Stand Sit to Stand: Max assist, +2 safety/equipment     Step pivot transfers: Mod assist, +2 safety/equipment               ADL either performed or assessed with clinical judgement   ADL Overall ADL's : Needs assistance/impaired Eating/Feeding: Set up;Sitting   Grooming: Set up;Sitting Grooming Details (indicate  cue type and reason): in recliner                     Toileting- Clothing Manipulation and Hygiene: +2 for physical assistance;+2 for safety/equipment;Total assistance;Sit to/from stand Toileting - Clothing Manipulation Details (indicate cue type and reason): pt had BM in bed            Extremity/Trunk Assessment Upper Extremity Assessment Upper Extremity Assessment: Generalized weakness            Vision   Vision Assessment?: No apparent visual deficits          Cognition Arousal/Alertness: Awake/alert Behavior During Therapy: WFL for tasks assessed/performed Overall Cognitive Status: Within Functional Limits for tasks assessed                                                     Pertinent Vitals/ Pain       Pain Assessment Pain Score: 3  Pain Location: scrotum, back Pain Descriptors / Indicators: Discomfort, Grimacing Pain Intervention(s): Limited activity within patient's tolerance, Monitored during session         Frequency  Min 2X/week        Progress Toward Goals  OT Goals(current goals can now be found in the care plan section)  Progress towards OT goals: Progressing toward goals     Plan Discharge plan remains appropriate       AM-PAC OT "6 Clicks" Daily Activity     Outcome Measure   Help from another person eating  meals?: None Help from another person taking care of personal grooming?: A Little Help from another person toileting, which includes using toliet, bedpan, or urinal?: A Lot Help from another person bathing (including washing, rinsing, drying)?: A Lot Help from another person to put on and taking off regular upper body clothing?: A Little Help from another person to put on and taking off regular lower body clothing?: A Lot 6 Click Score: 16    End of Session    OT Visit Diagnosis: Unsteadiness on feet (R26.81);Muscle weakness (generalized) (M62.81);Pain;Other symptoms and signs involving cognitive  function   Activity Tolerance Patient tolerated treatment well   Patient Left in chair;with call bell/phone within reach;with chair alarm set   Nurse Communication Mobility status        Time: 0211-1552 OT Time Calculation (min): 16 min  Charges: OT General Charges $OT Visit: 1 Visit OT Treatments $Self Care/Home Management : 8-22 mins  Lise Auer, OT Acute Rehabilitation Services [phone] Office- [phone]     Lukisha Procida, Karin Golden D 11/19/2021, 1:59 PM

## 2021-11-19 NOTE — Progress Notes (Signed)
PROGRESS NOTE  Glenn [name]  IHK:[id number] DOB: 1941-03-10 DOA: 10/21/2021 PCP: System, Provider Not In   Brief Narrative:  Patient is a 81 y.o. M with CAD s/p CABG 96, ICM and sCHF EF <20%, DM, CKD IIIb baseline 1.7, pAF on Eliquis, hx PPM and BPH who presented with 3 days weakness, back pain to [facility].  Patient does intermittent catheterization at home whenever he feels like doing usually every 48 hours.  On presentation he was hyportensive.  Lab work showed leukocytosis.  Creatinine was in the range of 3.6.  CT imaging showed distended bladder, discitis/osteomyelitis of the spine.  He was transferred to [facility], ICU for pressures.  Foley was placed for urinary retention.  MRI did not show any drainable focus, pressure started off, transferred out of ICU.  Underwent disc aspiration on 6/8 by IR, cultures showed Klebsiella.  ID was consulted and recommended antibiotics.  PT/OT recommended CIR on discharge.  Because of severe volume overload, IV Lasix was started.  Still looks volume overloaded.  Continue IV Lasix for today. Ultimate plan is to discharge to CIR after adequate fluid removal  Assessment & Plan:  Principal Problem:   Septic shock (HCC) Active Problems:   Bacteremia due to Klebsiella pneumoniae   Discitis   Osteomyelitis of lumbar spine (HCC)   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HCC)   Bladder outlet obstruction   Acute kidney injury (HCC)   Uncontrolled type 2 diabetes mellitus with hyperglycemia, with long-term current use of insulin (HCC)   Pacemaker   Chronic kidney disease, stage 3b (HCC)   Coronary artery disease involving native coronary artery of native heart without angina pectoris   Chronic combined systolic and diastolic CHF (congestive heart failure) (HCC)   Hyponatremia   Hypokalemia   Transaminitis   Myocardial injury   Thrombocytopenia (HCC)   Anemia due to chronic kidney disease   Coagulopathy (HCC)   Protein-calorie malnutrition, moderate (HCC)   Paroxysmal atrial  fibrillation (HCC)   Foot ulcer (HCC)  Septic shock/Klebsiella bacteremia/UTI: Presented to [facility] initially with back pain, weakness.  Found to be very hypotensive.  Septic shock suspected.  Started on broad-spectrum antibiotics.  Sent to ICU at [facility] with pressor support.  Currently hemodynamically stable Found to have osteomyelitis/discitis of lumbar spine, psoas abscess.  Underwent disc space drainage by IR.  Culture resulted Klebsiella.  ID was consulted.  PICC line placed.  Recommended cefazolin till 7/18.  Sepsis physiology has resolved.  Leukocytosis is improving.  Chronic combined systolic/diastolic congestive heart failure/volume overload: Known history of systolic congestive heart failure.  EF is less than 20%, has grade 2 diastolic dysfunction.  Status post ICD placement.  Looks severely volume overloaded y along with bilateral lower extremity edema, scrotal edema. Increased  Lasix  to 80 mg twice daily,will continue .  Monitor input/output, daily weight.  Takes torsemide at home.  He follows with cardiology.  Also takes Entresto at home which we will resume at some point.  AKI on CKD stage IIIb: Baseline creatinine arounfd 1.8.  Presented with creatinine in the range of 3.6. last creatinine in the range of 2.2.    Continue Lasix  Coronary artery disease: No anginal symptoms.  On Lipitor, beta-blocker  Paroxysmal A-fib: Currently rate is controlled.  On Eliquis for anticoagulation, on Coreg for rate control.  Monitor on telemetry  Bladder outlet obstruction: On presentation, imaging showed distended bladder.  Takes finasteride, Flomax at home.  Does self cath at home.  Foley was placed here.  Continue Foley until  IV Lasix discontinued.  Then will check voiding trial.  Type 2 diabetes/hyperglycemia: Last hemoglobin A1c of more than 10.  Continue current insulin regimen.  Monitor blood sugars  Deconditioning/debility: PT/OT recommended CIR on discharge.    Nutrition Problem: Moderate  Malnutrition Etiology: chronic illness (CHF) Pressure Injury 11/02/21 Foot Left;Lateral Deep Tissue Pressure Injury - Purple or maroon localized area of discolored intact skin or blood-filled blister due to damage of underlying soft tissue from pressure and/or shear. 5.5x4 (Active)  11/02/21   Location: Foot  Location Orientation: Left;Lateral  Staging: Deep Tissue Pressure Injury - Purple or maroon localized area of discolored intact skin or blood-filled blister due to damage of underlying soft tissue from pressure and/or shear.  Wound Description (Comments): 5.5x4  Present on Admission: Yes  Dressing Type Gauze (Comment) 11/18/21 0300    DVT prophylaxis:Place and maintain sequential compression device Start: 11/02/21 1108 apixaban (ELIQUIS) tablet 5 mg     Code Status: Full Code  Family Communication: Called and discussed with daughter on phone on 6/15  Patient status:Inpatient  Patient is from :Home  Anticipated discharge to:CIR  Estimated DC date: After improvement in the volume overload   Consultants: PCCM, IR, ID  Procedures: Dx aspiration  Antimicrobials:  Anti-infectives (From admission, onward)    Start     Dose/Rate Route Frequency Ordered Stop   11/12/21 1800  ceFAZolin (ANCEF) IVPB 2g/100 mL premix        2 g 200 mL/hr over 30 Minutes Intravenous Every 12 hours 11/12/21 0936     11/04/21 1400  ceFAZolin (ANCEF) IVPB 2g/100 mL premix  Status:  Discontinued        2 g 200 mL/hr over 30 Minutes Intravenous Every 8 hours 11/04/21 1058 11/12/21 0936   11/03/21 1800  vancomycin (VANCOCIN) IVPB 1000 mg/200 mL premix  Status:  Discontinued        1,000 mg 200 mL/hr over 60 Minutes Intravenous Every 48 hours 11/02/21 0259 11/02/21 0452   11/02/21 2200  cefTRIAXone (ROCEPHIN) 2 g in sodium chloride 0.9 % 100 mL IVPB  Status:  Discontinued        2 g 200 mL/hr over 30 Minutes Intravenous Every 24 hours 11/02/21 0452 11/04/21 1058   11/02/21 0230  ceFEPIme (MAXIPIME) 2  g in sodium chloride 0.9 % 100 mL IVPB  Status:  Discontinued        2 g 200 mL/hr over 30 Minutes Intravenous Every 24 hours 11/02/21 0131 11/02/21 0452       Subjective:  Patient seen and examined at bedside this morning.  Hemodynamically stable.  Lower extremity swelling, scrotal edema has been improving but still there.  Continue IV Lasix   Objective: Vitals:   11/18/21 2046 11/19/21 0500 11/19/21 0512 11/19/21 0812  BP: 113/61  107/60 105/60  Pulse: 83  82 86  Resp: 18  18   Temp: 98.1 F (36.7 C)  98.6 F (37 C) 98.9 F (37.2 C)  TempSrc: Oral  Oral Oral  SpO2: 100%  98% 98%  Weight:  69.3 kg    Height:        Intake/Output Summary (Last 24 hours) at 11/19/2021 1118 Last data filed at 11/19/2021 0900 Gross per 24 hour  Intake 1520 ml  Output 2001 ml  Net -481 ml   Filed Weights   11/17/21 0528 11/18/21 0600 11/19/21 0500  Weight: 69.3 kg 69.3 kg 69.3 kg    Examination: General exam: Overall comfortable, not in distress, very deconditioned, pleasant elderly  male HEENT: PERRL Respiratory system:  no wheezes or crackles  Cardiovascular system: S1 & S2 heard, RRR.  Gastrointestinal system: Abdomen is nondistended, soft and nontender. Central nervous system: Alert and oriented Extremities: 1+ bilateral lower extremity pitting edema, no clubbing ,no cyanosis Skin: No rashes, no ulcers,no icterus   GU: Foley, scrotal edema   Data Reviewed: I have personally reviewed following labs and imaging studies  CBC: Recent Labs  Lab 11/16/21 0459  WBC 8.4  HGB 8.0*  HCT 25.1*  MCV 83.9  PLT 99991111   Basic Metabolic Panel: Recent Labs  Lab 11/15/21 1300 11/16/21 0459 11/17/21 0437 11/18/21 0407 11/19/21 0315  NA 132* 133* 134* 135 136  K 4.8 4.8 4.3 4.4 4.3  CL 101 101 102 100 98  CO2 22 24 25 27 28   GLUCOSE 207* 125* 58* 131* 126*  BUN 89* 90* 84* 80* 81*  CREATININE 2.22* 2.24* 2.07* 2.07* 1.95*  CALCIUM 8.2* 8.2* 8.1* 8.2* 8.3*     Recent Results  (from the past 240 hour(s))  Aerobic/Anaerobic Culture w Gram Stain (surgical/deep wound)     Status: None   Collection Time: 11/09/21  2:47 PM   Specimen: Abscess  Result Value Ref Range Status   Specimen Description ABSCESS  Final   Special Requests INVERTEBRAL DISC  Final   Gram Stain   Final    ABUNDANT WBC PRESENT,BOTH PMN AND MONONUCLEAR NO ORGANISMS SEEN    Culture   Final    FEW KLEBSIELLA PNEUMONIAE NO ANAEROBES ISOLATED Performed at [facility], [address], [place], [place] [address]    Report Status 11/14/2021 FINAL  Final   Organism ID, Bacteria KLEBSIELLA PNEUMONIAE  Final      Susceptibility   Klebsiella pneumoniae - MIC*    AMPICILLIN RESISTANT Resistant     CEFAZOLIN <=4 SENSITIVE Sensitive     CEFEPIME <=0.12 SENSITIVE Sensitive     CEFTAZIDIME <=1 SENSITIVE Sensitive     CEFTRIAXONE <=0.25 SENSITIVE Sensitive     CIPROFLOXACIN <=0.25 SENSITIVE Sensitive     GENTAMICIN <=1 SENSITIVE Sensitive     IMIPENEM <=0.25 SENSITIVE Sensitive     TRIMETH/SULFA <=20 SENSITIVE Sensitive     AMPICILLIN/SULBACTAM 4 SENSITIVE Sensitive     PIP/TAZO 8 SENSITIVE Sensitive     * FEW KLEBSIELLA PNEUMONIAE     Radiology Studies: No results found.  Scheduled Meds:  apixaban  5 mg Oral BID   artificial tears   Both Eyes QHS   atorvastatin  80 mg Oral QHS   carvedilol  6.25 mg Oral BID WC   chlorhexidine  15 mL Mouth Rinse BID   Chlorhexidine Gluconate Cloth  6 each Topical Q0600   finasteride  5 mg Oral Daily   furosemide  80 mg Intravenous Q12H   insulin aspart  0-15 Units Subcutaneous TID WC   insulin aspart  0-5 Units Subcutaneous QHS   insulin detemir  15 Units Subcutaneous Daily   ketotifen  1 drop Both Eyes BID   latanoprost  1 drop Both Eyes QHS   mouth rinse  15 mL Mouth Rinse q12n4p   multivitamin with minerals  1 tablet Oral Daily   pantoprazole  40 mg Oral Q1200   polyethylene glycol  17 g Oral BID   sodium chloride flush  10-40 mL Intracatheter  Q12H   sodium chloride flush  10-40 mL Intracatheter Q12H   tamsulosin  0.8 mg Oral QHS   Continuous Infusions:  sodium chloride 250 mL (11/02/21  0205)   sodium chloride 250 mL (11/12/21 1244)    ceFAZolin (ANCEF) IV 2 g (11/18/21 2046)     LOS: 18 days   Burnadette Pop, MD [facility] P6/18/2023, 11:18 AM

## 2021-11-20 DIAGNOSIS — R7881 Bacteremia: Secondary | ICD-10-CM | POA: Diagnosis not present

## 2021-11-20 DIAGNOSIS — R6521 Severe sepsis with septic shock: Secondary | ICD-10-CM | POA: Diagnosis not present

## 2021-11-20 DIAGNOSIS — R778 Other specified abnormalities of plasma proteins: Secondary | ICD-10-CM

## 2021-11-20 DIAGNOSIS — N1832 Chronic kidney disease, stage 3b: Secondary | ICD-10-CM | POA: Diagnosis not present

## 2021-11-20 DIAGNOSIS — E440 Moderate protein-calorie malnutrition: Secondary | ICD-10-CM

## 2021-11-20 DIAGNOSIS — A419 Sepsis, unspecified organism: Secondary | ICD-10-CM | POA: Diagnosis not present

## 2021-11-20 DIAGNOSIS — N179 Acute kidney failure, unspecified: Secondary | ICD-10-CM | POA: Diagnosis not present

## 2021-11-20 LAB — BASIC METABOLIC PANEL
Anion gap: 10 (ref 5–15)
BUN: 76 mg/dL — ABNORMAL HIGH (ref 8–23)
CO2: 27 mmol/L (ref 22–32)
Calcium: 8.3 mg/dL — ABNORMAL LOW (ref 8.9–10.3)
Chloride: 96 mmol/L — ABNORMAL LOW (ref 98–111)
Creatinine, Ser: 1.89 mg/dL — ABNORMAL HIGH (ref 0.61–1.24)
GFR, Estimated: 36 mL/min — ABNORMAL LOW (ref >60)
Glucose, Bld: 175 mg/dL — ABNORMAL HIGH (ref 70–99)
Potassium: 3.8 mmol/L (ref 3.5–5.1)
Sodium: 133 mmol/L — ABNORMAL LOW (ref 135–145)

## 2021-11-20 LAB — GLUCOSE, CAPILLARY
Glucose-Capillary: 205 mg/dL — ABNORMAL HIGH (ref 70–99)
Glucose-Capillary: 212 mg/dL — ABNORMAL HIGH (ref 70–99)
Glucose-Capillary: 179 mg/dL — ABNORMAL HIGH (ref 70–99)
Glucose-Capillary: 151 mg/dL — ABNORMAL HIGH (ref 70–99)

## 2021-11-20 MED ORDER — ALBUMIN HUMAN 25 % IV SOLN
25.0000 g | Freq: Two times a day (BID) | INTRAVENOUS | Status: DC
  Administered 2021-11-20 – 2021-11-23 (×6): 25 g via INTRAVENOUS
  Filled 2021-11-20 (×6): qty 100

## 2021-11-20 MED ORDER — ZINC SULFATE 220 (50 ZN) MG PO CAPS
220.0000 mg | ORAL_CAPSULE | Freq: Every day | ORAL | Status: AC
  Administered 2021-11-20 – 2021-12-03 (×14): 220 mg via ORAL
  Filled 2021-11-20 (×14): qty 1

## 2021-11-20 MED ORDER — CEFAZOLIN SODIUM-DEXTROSE 2-4 GM/100ML-% IV SOLN: 2.0000 g | Freq: Three times a day (TID) | INTRAVENOUS | Status: DC

## 2021-11-20 MED ORDER — PROSOURCE PLUS PO LIQD
30.0000 mL | Freq: Three times a day (TID) | ORAL | Status: DC
  Administered 2021-11-20 – 2021-12-07 (×39): 30 mL via ORAL
  Filled 2021-11-20 (×35): qty 30

## 2021-11-20 MED ORDER — ASCORBIC ACID 500 MG PO TABS
500.0000 mg | ORAL_TABLET | Freq: Two times a day (BID) | ORAL | Status: DC
  Administered 2021-11-20 – 2021-12-04 (×30): 500 mg via ORAL
  Filled 2021-11-20 (×30): qty 1

## 2021-11-20 MED ORDER — CEFAZOLIN SODIUM-DEXTROSE 2-4 GM/100ML-% IV SOLN
2.0000 g | Freq: Two times a day (BID) | INTRAVENOUS | Status: DC
Start: 2021-11-20 — End: 2021-11-22
  Administered 2021-11-20 – 2021-11-21 (×3): 2 g via INTRAVENOUS
  Filled 2021-11-20 (×3): qty 100

## 2021-11-20 NOTE — Progress Notes (Signed)
Inpatient Rehab Admissions Coordinator:   Continue to follow for readiness for CIR.    Estill Dooms, PT, DPT Admissions Coordinator [phone] 11/20/21  11:36 AM

## 2021-11-20 NOTE — Consult Note (Signed)
Cardiology Consultation:   Patient ID: Glenn [name] MRN: [id number]; DOB: 10/25/1940  Admit date: 10/09/2021 Date of Consult: 11/20/2021  PCP:  System, Provider Not In   [facility] Providers Cardiologist: [place], [place]    Patient Profile:   Glenn [name] is a 81 y.o. male with a hx of CAD s/p CABG in 1996, paroxysmal atrial fibrillation, chronic systolic and diastolic heart failure with EF of 20%, s/p defibrillator/pacemaker, chronic kidney disease stage III, hypertension, diabetes mellitus and exposure to agent orange who is being seen 11/20/2021 for the evaluation of CHF at the request of Dr. Tawanna Solo.  Patient being followed by [facility] [place]. longstanding history of chronic systolic heart failure with EF around 20%.  Reports underwent pacemaker/defibrillator implantation recently.  No records available for review.  History of Present Illness:   Glenn [name] initially presented to [facility] 5/31 for generalized weakness and malaise after fall.  He was found to have septic shock secondary to Klebsiella bacteremia, UTI, creatinine of 3.6 and left ProSys abscess with L4-5 osteomyelitis/discitis. Patient underwent disc aspiration 6/8 by IR with culture growing Klebsiella. He was treated with broad-spectrum antibiotic and being followed by ID and has PICC line placement.   Patient is started on IV Lasix 80 mg twice daily on 6/16.  Cardiology is consulted for persistent anasarca. SCr improving gradually, 1.89 today.  Still with significant volume overload.  Weight  154 today (was 131 on admit). Net I & O -14.4L.  Improving scrotal edema. He  had keep head elevated for sleep. Albumin was 1.8 on 11/08/21.  Echo 11/02/21 1. There are some endomyocardial trabeculations noted but does not meet  criteria for left ventricular non-compaction. Left ventricular ejection  fraction, by estimation, is <20%. The left ventricle has severely  decreased function. The left ventricle has  no regional wall motion  abnormalities. Left ventricular diastolic  parameters are consistent with Grade II diastolic dysfunction  (pseudonormalization). There is the interventricular septum is flattened  in systole and diastole, consistent with right  ventricular pressure and volume overload.   2. Right ventricular systolic function is normal. The right ventricular  size is normal.   3. The mitral valve is normal in structure. Mild mitral valve  regurgitation. No evidence of mitral stenosis.   4. Tricuspid valve regurgitation is moderate.   5. The aortic valve is tricuspid. Aortic valve regurgitation is not  visualized. No aortic stenosis is present.   6. There is borderline dilatation of the aortic root, measuring 37 mm.   7. The inferior vena cava is normal in size with greater than 50%  respiratory variability, suggesting right atrial pressure of 3 mmHg.   Comparison(s): No prior Echocardiogram.  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)    MI (myocardial infarction) ([place])    NSVT (nonsustained ventricular tachycardia) ([place])    Renal disorder    kidney stones    Past Surgical History:  Procedure Laterality Date   BIOPSY PROSTATE     CORONARY ARTERY BYPASS GRAFT     x 4   IR LUMBAR DISC ASPIRATION W/IMG GUIDE  11/09/2021   KIDNEY STONE SURGERY       Inpatient Medications: Scheduled Meds:  (feeding supplement) PROSource Plus  30 mL  Oral TID BM   apixaban  5 mg Oral BID   artificial tears   Both Eyes QHS   vitamin C  500 mg Oral BID   atorvastatin  80 mg Oral QHS   carvedilol  6.25 mg Oral BID WC   chlorhexidine  15 mL Mouth Rinse BID   Chlorhexidine Gluconate Cloth  6 each Topical Q0600   finasteride  5 mg Oral Daily   furosemide   80 mg Intravenous Q12H   insulin aspart  0-15 Units Subcutaneous TID WC   insulin aspart  0-5 Units Subcutaneous QHS   insulin detemir  15 Units Subcutaneous Daily   ketotifen  1 drop Both Eyes BID   latanoprost  1 drop Both Eyes QHS   mouth rinse  15 mL Mouth Rinse q12n4p   multivitamin with minerals  1 tablet Oral Daily   pantoprazole  40 mg Oral Q1200   polyethylene glycol  17 g Oral BID   sodium chloride flush  10-40 mL Intracatheter Q12H   sodium chloride flush  10-40 mL Intracatheter Q12H   tamsulosin  0.8 mg Oral QHS   zinc sulfate  220 mg Oral Daily   Continuous Infusions:  sodium chloride 250 mL (11/02/21 0205)   sodium chloride 10 mL/hr at 11/19/21 1700    ceFAZolin (ANCEF) IV     PRN Meds: oxyCODONE, polyvinyl alcohol, sodium chloride flush, sodium chloride flush  Allergies:    Allergies  Allergen Reactions   Heparin Other (See Comments)    Causes "head to feel hot"    Social History:   Social History   Socioeconomic History   Marital status: Unknown    Spouse name: Not on file   Number of children: Not on file   Years of education: Not on file   Highest education level: Not on file  Occupational History   Not on file  Tobacco Use   Smoking status: Former   Smokeless tobacco: Never  Substance and Sexual Activity   Alcohol use: Yes    Comment: rarely   Drug use: Not on file   Sexual activity: Not on file  Other Topics Concern   Not on file  Social History Narrative   Not on file   Social Determinants of Health   Financial Resource Strain: Not on file  Food Insecurity: Not on file  Transportation Needs: Not on file  Physical Activity: Not on file  Stress: Not on file  Social Connections: Not on file  Intimate Partner Violence: Not on file    Family History:   History reviewed. No pertinent family history.   ROS:  Please see the history of present illness.  All other ROS reviewed and negative.     Physical Exam/Data:   Vitals:    11/19/21 1650 11/19/21 2037 11/20/21 0556 11/20/21 0927  BP: 114/76 115/72 109/65 116/69  Pulse: 80 82 79 83  Resp: 18 16 18    Temp: 98 F (36.7 C) (!) 97.5 F (36.4 C) 97.9 F (36.6 C) 97.9 F (36.6 C)  TempSrc: Oral  Oral Oral  SpO2: 100% 100% 99% 100%  Weight:   69.9 kg   Height:        Intake/Output Summary (Last 24 hours) at 11/20/2021 1602 Last data filed at 11/20/2021 1300 Gross per 24 hour  Intake 1504.74 ml  Output 2125 ml  Net -620.26 ml      11/20/2021    5:56 AM 11/19/2021    5:00 AM 11/18/2021  6:00 AM  Last 3 Weights  Weight (lbs) 154 lb 1.6 oz 152 lb 12.5 oz 152 lb 12.5 oz  Weight (kg) 69.9 kg 69.3 kg 69.3 kg     Body mass index is 22.11 kg/m.  General:  Well nourished, well developed, in no acute distress HEENT: normal Neck: + JVD Vascular: No carotid bruits; Distal pulses 2+ bilaterally Cardiac:  normal S1, S2; RRR; no murmur  Lungs:  Bibasilar rales  Abd: soft, nontender, no hepatomegaly  Ext: 2+  edema Musculoskeletal:  No deformities, BUE and BLE strength normal and equal Skin: warm and dry  Neuro:  CNs 2-12 intact, no focal abnormalities noted Psych:  Normal affect   EKG:  The EKG was personally reviewed and demonstrates: AV paced rhythm Telemetry:  Telemetry was personally reviewed and demonstrates: Not on telemetry  Relevant CV Studies: As above  Laboratory Data:  High Sensitivity Troponin:   Recent Labs  Lab 11/02/21 0119 11/02/21 0347  TROPONINIHS 83* 83*     Chemistry Recent Labs  Lab 11/18/21 0407 11/19/21 0315 11/20/21 0424  NA 135 136 133*  K 4.4 4.3 3.8  CL 100 98 96*  CO2 27 28 27   GLUCOSE 131* 126* 175*  BUN 80* 81* 76*  CREATININE 2.07* 1.95* 1.89*  CALCIUM 8.2* 8.3* 8.3*  GFRNONAA 32* 35* 36*  ANIONGAP 8 10 10      Hematology Recent Labs  Lab 11/16/21 0459  WBC 8.4  RBC 2.99*  HGB 8.0*  HCT 25.1*  MCV 83.9  MCH 26.8  MCHC 31.9  RDW 16.7*  PLT 302    Radiology/Studies:  No results  found.   Assessment and Plan:   Acute on chronic combined CHF -Received fluids initially for septic shock.  He is on IV Lasix 80 mg twice daily since 6/16.  Overall net INO -14 L since admit however weight is up 24 lb. He is significantly volume overloaded on exam. -Continue IV Lasix 80 mg twice daily -Check BNP -Continue carvedilol 6.25 mg twice daily -Systolic blood pressure in 100s  -Unable to add SGLT2 inhibitor due to UTI -No ACE/ARB/Entresto due to renal function -If blood pressure allows will add hydralazine/Imdur -He has low albumin, Consider supplement by primary team   2.  CAD s/p CABG -No anginal symptoms -Not on aspirin due to need of anticoagulation -Continue beta-blocker -Consider statin  3.  Paroxysmal atrial fibrillation -Paced rhythm on admission.  He is not on telemetry. -Continue Eliquis for anticoagulation -Continue carvedilol  4.  Acute on chronic kidney disease stage III -Creatinine 3.6 on admit.  Now improving with IV diuresis. 1.89 today.   Otherwise per primary team  Risk Assessment/Risk Scores:   New York Heart Association (NYHA) Functional Class NYHA Class III  CHA2DS2-VASc Score = 6  This indicates a 9.7% annual risk of stroke. The patient's score is based upon: CHF History: 1 HTN History: 1 Diabetes History: 1 Stroke History: 0 Vascular Disease History: 1 Age Score: 2 Gender Score: 0  For questions or updates, please contact [facility] Please consult [email or web address] for contact info under   11/18/21, [place]  11/20/2021 4:02 PM

## 2021-11-20 NOTE — Progress Notes (Deleted)
PHARMACY NOTE:  ANTIMICROBIAL RENAL DOSAGE ADJUSTMENT  Current antimicrobial regimen includes a mismatch between antimicrobial dosage and estimated renal function.  As per policy approved by the Pharmacy & Therapeutics and Medical Executive Committees, the antimicrobial dosage will be adjusted accordingly.  Current antimicrobial dosage:  Cefazolin 2 g IV Q12H   Indication: Klebsiella pneumoniae osteomyelitis/discitis   Renal Function: SCr improving  Estimated Creatinine Clearance: 31.8 mL/min (A) (by C-G formula based on SCr of 1.89 mg/dL (H)). []      On intermittent HD, scheduled: []      On CRRT    Antimicrobial dosage has been changed to:  Cefazolin 2g IV Q8H   OUTPATIENT  PARENTERAL ANTIBIOTIC THERAPY (OPAT)   Indication: Kleb PNA osteo/discitis Regimen: Cefazolin 2g IV every 8 hours End date: 12/19/21   IV antibiotic discharge orders are pended. To discharging provider:  please sign these orders via discharge navigator,  Select New Orders & click on the button choice - Manage This Unsigned Work.   NOTE: monitor for change in renal function for updating of OPAT orders while remaining inpatient   Thank you for allowing pharmacy to be a part of this patient's care.  , PharmD, BCPS, BCCCP Clinical Pharmacist Please refer to [facility] for [facility] numbers 11/20/2021 12:10 PM

## 2021-11-20 NOTE — Progress Notes (Signed)
Physical Therapy Treatment Patient Details Name: Glenn [name] MRN: [id number] DOB: 11-09-1940 Today's Date: 11/20/2021   History of Present Illness Patient is a 81 y/o male who presents on 5/31 with generalized weakness and falls. 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 PMH includes DM, HTN, MI, CAD s/p CABG, recent PPM placement and DCCV for Afib, CHF, CKD, BPH. L2-L3 disc aspiration planned for 11/09/21.    PT Comments    Patient cooperative and even asking to walk more! He continues to need assist with sit to stand and sequencing with RW. He requires min guard assist with ambulating in a straight path with chair following behind him for seated rest breaks as needed.     Recommendations for follow up therapy are one component of a multi-disciplinary discharge planning process, led by the attending physician.  Recommendations may be updated based on patient status, additional functional criteria and insurance authorization.  Follow Up Recommendations  Acute inpatient rehab (3hours/day)     Assistance Recommended at Discharge Frequent or constant Supervision/Assistance  Patient can return home with the following A little help with walking and/or transfers;A little help with bathing/dressing/bathroom;Assistance with cooking/housework;Assist for transportation;Help with stairs or ramp for entrance   Equipment Recommendations  None recommended by PT    Recommendations for Other Services       Precautions / Restrictions Precautions Precautions: None Restrictions Weight Bearing Restrictions: No     Mobility  Bed Mobility Overal bed mobility: Needs Assistance Bed Mobility: Supine to Sit     Supine to sit: Min guard     General bed mobility comments: moving slowly, but did not need physical assist    Transfers Overall transfer level: Needs assistance Equipment  used: Rolling walker (2 wheels) Transfers: Sit to/from Stand Sit to Stand: Min assist, From elevated surface (bed elevated to simulate home)           General transfer comment: light min assist with cues for sequencing with RW    Ambulation/Gait Ambulation/Gait assistance: Min assist, +2 safety/equipment Gait Distance (Feet): 40 Feet (seated rest, 25 ft) Assistive device: Rolling walker (2 wheels) Gait Pattern/deviations: Step-to pattern, Shuffle, Decreased stride length, Trunk flexed Gait velocity: decreased     General Gait Details: short shuffle steps. MinA for balance and RW management. +2 for chair follow.   Stairs             Wheelchair Mobility    Modified Rankin (Stroke Patients Only)       Balance Overall balance assessment: Needs assistance Sitting-balance support: No upper extremity supported, Feet supported Sitting balance-Leahy Scale: Fair     Standing balance support: Reliant on assistive device for balance, Bilateral upper extremity supported Standing balance-Leahy Scale: Poor Standing balance comment: reliant on RW for support                            Cognition Arousal/Alertness: Awake/alert Behavior During Therapy: WFL for tasks assessed/performed Overall Cognitive Status: Within Functional Limits for tasks assessed                                          Exercises General Exercises - Lower Extremity Ankle Circles/Pumps: AROM, Both, 10 reps Heel Slides: AROM, Both, 10 reps Mini-Sqauts: AROM, Both, 5 reps  General Comments        Pertinent Vitals/Pain Pain Assessment Pain Assessment: No/denies pain    Home Living                          Prior Function            PT Goals (current goals can now be found in the care plan section) Acute Rehab PT Goals Patient Stated Goal: to go home PT Goal Formulation: With patient Time For Goal Achievement: 11/20/21 Potential to Achieve Goals:  Good Progress towards PT goals: Progressing toward goals    Frequency    Min 3X/week      PT Plan Current plan remains appropriate    Co-evaluation              AM-PAC PT "6 Clicks" Mobility   Outcome Measure  Help needed turning from your back to your side while in a flat bed without using bedrails?: A Little Help needed moving from lying on your back to sitting on the side of a flat bed without using bedrails?: A Little Help needed moving to and from a bed to a chair (including a wheelchair)?: A Little Help needed standing up from a chair using your arms (e.g., wheelchair or bedside chair)?: A Little Help needed to walk in hospital room?: Total Help needed climbing 3-5 steps with a railing? : Total 6 Click Score: 14    End of Session Equipment Utilized During Treatment: Gait belt Activity Tolerance: Patient tolerated treatment well Patient left: in chair;with call bell/phone within reach;with chair alarm set Nurse Communication: Mobility status PT Visit Diagnosis: Unsteadiness on feet (R26.81);Other abnormalities of gait and mobility (R26.89);Muscle weakness (generalized) (M62.81);Pain     Time: 0902-0922 PT Time Calculation (min) (ACUTE ONLY): 20 min  Charges:  $Gait Training: 8-22 mins                      Jerolyn Center, PT Acute Rehabilitation Services  Office [phone]    Zena Amos 11/20/2021, 10:15 AM

## 2021-11-20 NOTE — Progress Notes (Signed)
Nutrition Follow-up  DOCUMENTATION CODES:   Non-severe (moderate) malnutrition in context of chronic illness  INTERVENTION:   -30 ml Prosource Plus TID, each supplement provides 100 kcals and 15 grams protein -Continue MVI with minerals daily -500 mg vitamin C BID -220 mg zinc sulfate daily   NUTRITION DIAGNOSIS:   Moderate Malnutrition related to chronic illness (CHF) as evidenced by moderate fat depletion, severe muscle depletion.  Ongoing  GOAL:   Patient will meet greater than or equal to 90% of their needs  Progressing   MONITOR:   PO intake, Supplement acceptance, Labs, Weight trends, Skin, I & O's  REASON FOR ASSESSMENT:   Consult Assessment of nutrition requirement/status  ASSESSMENT:   81 year old male who presented to the ED on 5/31 with generalized weakness. PMH of CAD s/p CABG, recent PPM placement, atrial fibrillation, DM, CHF, CAD. Pt admitted with septic shock 2/2 UTI with hydronephrosis, AKI, bacteremia, L psoas abscess, and vertebral osteomyelitis/discitis.  Reviewed I/O's: -320 ml x 24 hours and -14.1 L since 11/06/21  UOP: 1.8 L x 24 hours   Pt unavailable at time of visit. Attempted to speak with pt via call to hospital room phone, however, unable to reach. RD unable to obtain further nutrition-related history or complete nutrition-focused physical exam at this time.    Pt's intake has improved. Noted meal completions 75-100%. Glucerna shakes d/c by MD on 11/18/21.  Per MD notes, pt with some fluid overload and undergoing diuresis. Plan to admit to CIR once medically ready.   Medications reviewed and include lasix and miralax.   Labs reviewed: Na: 133, CBGS: 157-246 (inpatient orders for glycemic control are 0-15 units insulin aspart TID with meals, 0-5 units insulin aspart daily at bedtime, and 15 units insulin detemir daily).    Diet Order:   Diet Order             Diet Carb Modified Fluid consistency: Thin; Room service appropriate? Yes   Diet effective now                   EDUCATION NEEDS:   Education needs have been addressed  Skin:  Skin Assessment: Skin Integrity Issues: Skin Integrity Issues:: Stage II, Other (Comment) DTI: L foot Stage II: lt ischial tuberosity, rt posterior elbow Other: non-pressure wound to lt pretibial  Last BM:  11/19/21  Height:   Ht Readings from Last 1 Encounters:  11/07/21 5\' 10"  (1.778 m)    Weight:   Wt Readings from Last 1 Encounters:  11/20/21 69.9 kg   BMI:  Body mass index is 22.11 kg/m.  Estimated Nutritional Needs:   Kcal:  11/22/21  Protein:  115-130 grams  Fluid:  > 2 L    [id number], RD, LDN, CDCES Registered Dietitian II Certified Diabetes Care and Education Specialist Please refer to [facility] for RD and/or RD on-call/weekend/after hours pager

## 2021-11-20 NOTE — Progress Notes (Signed)
PROGRESS NOTE  Glenn [name]  EQA:[id number] DOB: 05-25-1941 DOA: 10/30/2021 PCP: System, Provider Not In   Brief Narrative:  Patient is a 81 y.o. M with CAD s/p CABG 96, ICM and sCHF EF <20%, DM, CKD IIIb baseline 1.7, pAF on Eliquis, hx PPM and BPH who presented with 3 days weakness, back pain to [facility].  Patient does intermittent catheterization at home whenever he feels like doing usually every 48 hours.  On presentation he was hyportensive.  Lab work showed leukocytosis.  Creatinine was in the range of 3.6.  CT imaging showed distended bladder, discitis/osteomyelitis of the spine.  He was transferred to [facility], ICU for pressures.  Foley was placed for urinary retention.  MRI did not show any drainable focus, pressure started off, transferred out of ICU.  Underwent disc aspiration on 6/8 by IR, cultures showed Klebsiella.  ID was consulted and recommended antibiotics.  PT/OT recommended CIR on discharge.  Because of severe volume overload, IV Lasix was started.  Cardiology also consulted today.  Ultimate plan is to discharge to CIR after adequate fluid removal  Assessment & Plan:  Principal Problem:   Septic shock (HCC) Active Problems:   Bacteremia due to Klebsiella pneumoniae   Discitis   Osteomyelitis of lumbar spine (HCC)   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HCC)   Bladder outlet obstruction   Acute kidney injury (HCC)   Uncontrolled type 2 diabetes mellitus with hyperglycemia, with long-term current use of insulin (HCC)   Pacemaker   Chronic kidney disease, stage 3b (HCC)   Coronary artery disease involving native coronary artery of native heart without angina pectoris   Chronic combined systolic and diastolic CHF (congestive heart failure) (HCC)   Hyponatremia   Hypokalemia   Transaminitis   Myocardial injury   Thrombocytopenia (HCC)   Anemia due to chronic kidney disease   Coagulopathy (HCC)   Protein-calorie malnutrition, moderate (HCC)   Paroxysmal atrial fibrillation (HCC)   Foot ulcer  (HCC)  Septic shock/Klebsiella bacteremia/UTI: Presented to [facility] initially with back pain, weakness.  Found to be very hypotensive.  Septic shock suspected.  Started on broad-spectrum antibiotics.  Sent to ICU at [facility] with pressor support.  Currently hemodynamically stable Found to have osteomyelitis/discitis of lumbar spine, psoas abscess.  Underwent disc space drainage by IR.  Culture resulted Klebsiella.  ID was consulted.  PICC line placed.  Recommended cefazolin till 7/18.  Sepsis physiology has resolved.  Leukocytosis resolved..  Chronic combined systolic/diastolic congestive heart failure/volume overload: Known history of systolic congestive heart failure.  EF is less than 20%, has grade 2 diastolic dysfunction.  Status post ICD placement.  Looks severely volume overloaded  along with bilateral lower extremity edema, scrotal edema.On lasix 80 mg twice daily,will continue .  Monitor input/output, daily weight.  Takes torsemide at home.  He follows with cardiology.  Also takes Entresto at home which we will resume at some point.  Cardiology consulted today because of persistent anasarca.  AKI on CKD stage IIIb: Baseline creatinine arounfd 1.8.  Presented with creatinine in the range of 3.6. last creatinine in the range of 2.2.    Continue Lasix.  Kidney function close to baseline now.  Coronary artery disease: No anginal symptoms.  On Lipitor, beta-blocker  Paroxysmal A-fib: Currently rate is controlled.  On Eliquis for anticoagulation, on Coreg for rate control.  Monitor on telemetry  Bladder outlet obstruction: On presentation, imaging showed distended bladder.  Takes finasteride, Flomax at home.  Does self cath at home.  Foley was placed  here.  Continue Foley until IV Lasix discontinued.  Then will check voiding trial.  Type 2 diabetes/hyperglycemia: Last hemoglobin A1c of more than 10.  Continue current insulin regimen.  Monitor blood sugars  Moderate malnutrition: Nutritionist  following  Deconditioning/debility: PT/OT recommended CIR on discharge.    Nutrition Problem: Moderate Malnutrition Etiology: chronic illness (CHF) Pressure Injury 11/02/21 Foot Left;Lateral Deep Tissue Pressure Injury - Purple or maroon localized area of discolored intact skin or blood-filled blister due to damage of underlying soft tissue from pressure and/or shear. 5.5x4 (Active)  11/02/21   Location: Foot  Location Orientation: Left;Lateral  Staging: Deep Tissue Pressure Injury - Purple or maroon localized area of discolored intact skin or blood-filled blister due to damage of underlying soft tissue from pressure and/or shear.  Wound Description (Comments): 5.5x4  Present on Admission: Yes  Dressing Type Gauze (Comment) 11/20/21 0900    DVT prophylaxis:Place and maintain sequential compression device Start: 11/02/21 1108 apixaban (ELIQUIS) tablet 5 mg     Code Status: Full Code  Family Communication: Called and discussed with daughter on phone on 6/15  Patient status:Inpatient  Patient is from :Home  Anticipated discharge to:CIR  Estimated DC date: After improvement in the volume overload   Consultants: PCCM, IR, ID  Procedures: Dx aspiration  Antimicrobials:  Anti-infectives (From admission, onward)    Start     Dose/Rate Route Frequency Ordered Stop   11/12/21 1800  ceFAZolin (ANCEF) IVPB 2g/100 mL premix        2 g 200 mL/hr over 30 Minutes Intravenous Every 12 hours 11/12/21 0936     11/04/21 1400  ceFAZolin (ANCEF) IVPB 2g/100 mL premix  Status:  Discontinued        2 g 200 mL/hr over 30 Minutes Intravenous Every 8 hours 11/04/21 1058 11/12/21 0936   11/03/21 1800  vancomycin (VANCOCIN) IVPB 1000 mg/200 mL premix  Status:  Discontinued        1,000 mg 200 mL/hr over 60 Minutes Intravenous Every 48 hours 11/02/21 0259 11/02/21 0452   11/02/21 2200  cefTRIAXone (ROCEPHIN) 2 g in sodium chloride 0.9 % 100 mL IVPB  Status:  Discontinued        2 g 200 mL/hr  over 30 Minutes Intravenous Every 24 hours 11/02/21 0452 11/04/21 1058   11/02/21 0230  ceFEPIme (MAXIPIME) 2 g in sodium chloride 0.9 % 100 mL IVPB  Status:  Discontinued        2 g 200 mL/hr over 30 Minutes Intravenous Every 24 hours 11/02/21 0131 11/02/21 0452       Subjective:  Patient seen and examined at the bedside this morning.  Hemodynamically stable.  Comfortable.  Sitting on the chair but has persistent bilateral lower extremity edema and scrotal edema  Objective: Vitals:   11/19/21 1650 11/19/21 2037 11/20/21 0556 11/20/21 0927  BP: 114/76 115/72 109/65 116/69  Pulse: 80 82 79 83  Resp: 18 16 18    Temp: 98 F (36.7 C) (!) 97.5 F (36.4 C) 97.9 F (36.6 C) 97.9 F (36.6 C)  TempSrc: Oral  Oral Oral  SpO2: 100% 100% 99% 100%  Weight:   69.9 kg   Height:        Intake/Output Summary (Last 24 hours) at 11/20/2021 1110 Last data filed at 11/20/2021 0846 Gross per 24 hour  Intake 1504.74 ml  Output 1825 ml  Net -320.26 ml   Filed Weights   11/18/21 0600 11/19/21 0500 11/20/21 0556  Weight: 69.3 kg 69.3 kg 69.9 kg  Examination: General exam: Overall comfortable, not in distress, chronically ill looking, weak HEENT: PERRL Respiratory system:  no wheezes or crackles  Cardiovascular system: S1 & S2 heard, RRR.  Gastrointestinal system: Abdomen is nondistended, soft and nontender. Central nervous system: Alert and oriented Extremities: Severe bilateral lower extremity pitting edema, no clubbing ,no cyanosis Skin: No rashes, no ulcers,no icterus   GU: Foley, scrotal/penile edema   Data Reviewed: I have personally reviewed following labs and imaging studies  CBC: Recent Labs  Lab 11/16/21 0459  WBC 8.4  HGB 8.0*  HCT 25.1*  MCV 83.9  PLT [id number]   Basic Metabolic Panel: Recent Labs  Lab 11/16/21 0459 11/17/21 0437 11/18/21 0407 11/19/21 0315 11/20/21 0424  NA 133* 134* 135 136 133*  K 4.8 4.3 4.4 4.3 3.8  CL 101 102 100 98 96*  CO2 24 25 27 28 27    GLUCOSE 125* 58* 131* 126* 175*  BUN 90* 84* 80* 81* 76*  CREATININE 2.24* 2.07* 2.07* 1.95* 1.89*  CALCIUM 8.2* 8.1* 8.2* 8.3* 8.3*     No results found for this or any previous visit (from the past 240 hour(s)).    Radiology Studies: No results found.  Scheduled Meds:  (feeding supplement) PROSource Plus  30 mL Oral TID BM   apixaban  5 mg Oral BID   artificial tears   Both Eyes QHS   vitamin C  500 mg Oral BID   atorvastatin  80 mg Oral QHS   carvedilol  6.25 mg Oral BID WC   chlorhexidine  15 mL Mouth Rinse BID   Chlorhexidine Gluconate Cloth  6 each Topical Q0600   finasteride  5 mg Oral Daily   furosemide  80 mg Intravenous Q12H   insulin aspart  0-15 Units Subcutaneous TID WC   insulin aspart  0-5 Units Subcutaneous QHS   insulin detemir  15 Units Subcutaneous Daily   ketotifen  1 drop Both Eyes BID   latanoprost  1 drop Both Eyes QHS   mouth rinse  15 mL Mouth Rinse q12n4p   multivitamin with minerals  1 tablet Oral Daily   pantoprazole  40 mg Oral Q1200   polyethylene glycol  17 g Oral BID   sodium chloride flush  10-40 mL Intracatheter Q12H   sodium chloride flush  10-40 mL Intracatheter Q12H   tamsulosin  0.8 mg Oral QHS   zinc sulfate  220 mg Oral Daily   Continuous Infusions:  sodium chloride 250 mL (11/02/21 0205)   sodium chloride 10 mL/hr at 11/19/21 1700    ceFAZolin (ANCEF) IV 2 g (11/20/21 0829)     LOS: 19 days   Shelly Coss, MD [facility] P6/19/2023, 11:10 AM

## 2021-11-21 DIAGNOSIS — I2510 Atherosclerotic heart disease of native coronary artery without angina pectoris: Secondary | ICD-10-CM | POA: Diagnosis not present

## 2021-11-21 DIAGNOSIS — R7881 Bacteremia: Secondary | ICD-10-CM | POA: Diagnosis not present

## 2021-11-21 DIAGNOSIS — N1832 Chronic kidney disease, stage 3b: Secondary | ICD-10-CM | POA: Diagnosis not present

## 2021-11-21 DIAGNOSIS — E876 Hypokalemia: Secondary | ICD-10-CM | POA: Diagnosis not present

## 2021-11-21 DIAGNOSIS — R6521 Severe sepsis with septic shock: Secondary | ICD-10-CM | POA: Diagnosis not present

## 2021-11-21 DIAGNOSIS — A419 Sepsis, unspecified organism: Secondary | ICD-10-CM | POA: Diagnosis not present

## 2021-11-21 LAB — GLUCOSE, CAPILLARY
Glucose-Capillary: 163 mg/dL — ABNORMAL HIGH (ref 70–99)
Glucose-Capillary: 232 mg/dL — ABNORMAL HIGH (ref 70–99)
Glucose-Capillary: 158 mg/dL — ABNORMAL HIGH (ref 70–99)
Glucose-Capillary: 83 mg/dL (ref 70–99)

## 2021-11-21 LAB — BASIC METABOLIC PANEL
Anion gap: 11 (ref 5–15)
BUN: 76 mg/dL — ABNORMAL HIGH (ref 8–23)
CO2: 28 mmol/L (ref 22–32)
Calcium: 8.5 mg/dL — ABNORMAL LOW (ref 8.9–10.3)
Chloride: 98 mmol/L (ref 98–111)
Creatinine, Ser: 1.78 mg/dL — ABNORMAL HIGH (ref 0.61–1.24)
GFR, Estimated: 39 mL/min — ABNORMAL LOW (ref >60)
Glucose, Bld: 139 mg/dL — ABNORMAL HIGH (ref 70–99)
Potassium: 3.8 mmol/L (ref 3.5–5.1)
Sodium: 137 mmol/L (ref 135–145)

## 2021-11-21 LAB — BRAIN NATRIURETIC PEPTIDE: B Natriuretic Peptide: 1403.5 pg/mL — ABNORMAL HIGH (ref 0.0–100.0)

## 2021-11-21 MED ORDER — POTASSIUM CHLORIDE CRYS ER 20 MEQ PO TBCR
40.0000 meq | EXTENDED_RELEASE_TABLET | Freq: Every day | ORAL | Status: DC
  Administered 2021-11-21 – 2021-11-22 (×2): 40 meq via ORAL
  Filled 2021-11-21 (×2): qty 2

## 2021-11-21 MED ORDER — FUROSEMIDE 10 MG/ML IJ SOLN
80.0000 mg | Freq: Three times a day (TID) | INTRAMUSCULAR | Status: DC
  Administered 2021-11-21 – 2021-11-23 (×6): 80 mg via INTRAVENOUS
  Filled 2021-11-21 (×6): qty 8

## 2021-11-21 NOTE — Progress Notes (Signed)
Progress Note  Patient Name: Glenn [name] Date of Encounter: 11/21/2021  [facility] Cardiologist: [facility]  Subjective   No acute overnight events. Patient states his breathing is "fair;" however, he does reports some worsening shortness of breath with exertion. He is about to ambulate with PT. No chest pain. He continues to be grossly volume overloaded.   Inpatient Medications    Scheduled Meds:  (feeding supplement) PROSource Plus  30 mL Oral TID BM   apixaban  5 mg Oral BID   artificial tears   Both Eyes QHS   vitamin C  500 mg Oral BID   atorvastatin  80 mg Oral QHS   chlorhexidine  15 mL Mouth Rinse BID   Chlorhexidine Gluconate Cloth  6 each Topical Q0600   finasteride  5 mg Oral Daily   furosemide  80 mg Intravenous Q12H   insulin aspart  0-15 Units Subcutaneous TID WC   insulin aspart  0-5 Units Subcutaneous QHS   insulin detemir  15 Units Subcutaneous Daily   ketotifen  1 drop Both Eyes BID   latanoprost  1 drop Both Eyes QHS   mouth rinse  15 mL Mouth Rinse q12n4p   multivitamin with minerals  1 tablet Oral Daily   pantoprazole  40 mg Oral Q1200   polyethylene glycol  17 g Oral BID   sodium chloride flush  10-40 mL Intracatheter Q12H   sodium chloride flush  10-40 mL Intracatheter Q12H   tamsulosin  0.8 mg Oral QHS   zinc sulfate  220 mg Oral Daily   Continuous Infusions:  sodium chloride 250 mL (11/02/21 0205)   sodium chloride 10 mL/hr at 11/19/21 1700   albumin human 25 g (11/21/21 0853)    ceFAZolin (ANCEF) IV 2 g (11/21/21 0817)   PRN Meds: oxyCODONE, polyvinyl alcohol, sodium chloride flush, sodium chloride flush   Vital Signs    Vitals:   11/20/21 2038 11/21/21 0020 11/21/21 0500 11/21/21 0618  BP: 123/69   102/61  Pulse: 86   86  Resp: 18   16  Temp: 98 F (36.7 C)   98.5 F (36.9 C)  TempSrc: Oral   Oral  SpO2: 100%   100%  Weight:  70.6 kg 70.6 kg   Height:        Intake/Output Summary (Last 24 hours) at 11/21/2021  0924 Last data filed at 11/21/2021 0826 Gross per 24 hour  Intake 490 ml  Output 1350 ml  Net -860 ml      11/21/2021    5:00 AM 11/21/2021   12:20 AM 11/20/2021    5:56 AM  Last 3 Weights  Weight (lbs) 155 lb 10.3 oz 155 lb 10.3 oz 154 lb 1.6 oz  Weight (kg) 70.6 kg 70.6 kg 69.9 kg      Telemetry    V-paced rhythm with occasional PVC/ventricular couplets. One 5 beat run of NSVT noted. - Personally Reviewed  ECG    No new ECG tracing today. - Personally Reviewed  Physical Exam   GEN: No acute distress.   Neck: Elevated JVD. Cardiac: RRR. II/VI systolic murmur heard best at apex. No rubs or gallops.  Respiratory: No increased work of breathing. Crackles noted in bilateral bases. No wheezes or rhonchi.  GI: Soft, non-distended, and non-tender. MS: 2-3+ pitting edema of bilateral lower extremities. No deformity. Skin: Warm and dry. Neuro:  No focal deficits. Psych: Normal affect. Responds appropriately.  Labs    High Sensitivity Troponin:   Recent  Labs  Lab 11/02/21 0119 11/02/21 0347  TROPONINIHS 83* 83*     Chemistry Recent Labs  Lab 11/19/21 0315 11/20/21 0424 11/21/21 0355  NA 136 133* 137  K 4.3 3.8 3.8  CL 98 96* 98  CO2 28 27 28   GLUCOSE 126* 175* 139*  BUN 81* 76* 76*  CREATININE 1.95* 1.89* 1.78*  CALCIUM 8.3* 8.3* 8.5*  GFRNONAA 35* 36* 39*  ANIONGAP 10 10 11     Lipids No results for input(s): "CHOL", "TRIG", "HDL", "LABVLDL", "LDLCALC", "CHOLHDL" in the last 168 hours.  Hematology Recent Labs  Lab 11/16/21 0459  WBC 8.4  RBC 2.99*  HGB 8.0*  HCT 25.1*  MCV 83.9  MCH 26.8  MCHC 31.9  RDW 16.7*  PLT 302   Thyroid No results for input(s): "TSH", "FREET4" in the last 168 hours.  BNP Recent Labs  Lab 11/21/21 0355  BNP 1,403.5*    DDimer No results for input(s): "DDIMER" in the last 168 hours.   Radiology    No results found.  Cardiac Studies   Echocardiogram 11/02/2021: Impressions: 1. There are some endomyocardial  trabeculations noted but does not meet  criteria for left ventricular non-compaction. Left ventricular ejection  fraction, by estimation, is <20%. The left ventricle has severely  decreased function. The left ventricle has  no regional wall motion abnormalities. Left ventricular diastolic  parameters are consistent with Grade II diastolic dysfunction  (pseudonormalization). There is the interventricular septum is flattened  in systole and diastole, consistent with right  ventricular pressure and volume overload.   2. Right ventricular systolic function is normal. The right ventricular  size is normal.   3. The mitral valve is normal in structure. Mild mitral valve  regurgitation. No evidence of mitral stenosis.   4. Tricuspid valve regurgitation is moderate.   5. The aortic valve is tricuspid. Aortic valve regurgitation is not  visualized. No aortic stenosis is present.   6. There is borderline dilatation of the aortic root, measuring 37 mm.   7. The inferior vena cava is normal in size with greater than 50%  respiratory variability, suggesting right atrial pressure of 3 mmHg.   Comparison(s): No prior Echocardiogram.  Patient Profile     81 y.o. 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 He was transferred to [facility] where he was admitted to medical ICU. He underwent disc aspiration on 6/8 by IR which grew Klebsiella. He was treated with IV fluids per sepsis protocol and then went into acute CHF. He was started on IV Lasix but remained significant volume overloaded. Therefore, Cardiology was consulted for assistance on  11/20/2021.   Assessment & Plan    Acute on Chronic Combined CHF BNP 1,403 today. Echo this admission showed LVEF of <20% with no regional wall motion abnormalities and grade 2 diastolic dysfunction. RV normal but interventricular septum is flattened in systolic and diastole consistent with RV pressure and volume overload. Patient currently being diuresed with IV Lasix. Documented urinary output of 1.7 L yesterday and net negative 13 L this admission. Weight remains up about 20 lbs since admission. Renal function improving with diuresis. - Still grossly volume  overloaded. - Currently on IV Lasix 80mg  twice daily. Will discuss increasing this to three times daily with MD. Dr. Radford Pax recommended adding albumin along with this to help prevent intravascular volume depletion and promote better diuresis while maintaining BP. - Home Coreg on hold due to acute CHF exacerbation and soft BP. - No ACEi/ARB/ARNI, MRA, or SGLT2 inhibitor at this time due to AKI and urosepsis. - Continue to monitor daily weights, strict I/Os, and renal function.  CAD Elevated Troponin S/p remote CABG in 1998. High-sensitivity troponin minimally elevated and flat at 83 >> 83. Not consistent with ACS. Echo as above. - No angina.  - No aspirin due to need for anticoagulation. - Continue high-intensity statin.  Paroxysmal Atrial Fibrillation Patient reports recently being diagnosed with atrial fibrillation and states he underwent DCCV about 5 weeks ago. Telemetry shows V-paced rhythm. - Home Coreg on hold due to soft BP and acute CHF exacerbation. - Continue chronic anticoagulation with Eliquis 5mg  twice daily.  S/p [place] like this is for primary prevention for sudden cardiac death given CHF. Device was interrogated on 11/02/2021 and showed no sustained VT in detection zone. - Followed at the [place].  Hypertension BP soft at times but stable. - Continue diuresis as above.  Hyperlipidemia - Continue  Lipitor 80mg  daily. - Will check fasting lipid panel and LFTs in the morning.  Acute on CKD Stage III Creatinine 3.62 on admission in setting of urosepsis and hydronephrosis. Renal function has fluctuated some this admission but creatinine has been downtrending tthe past several days. - Creatinine 1.78 today. - Continue to monitor closely with diuresis.  Otherwise, per primary team. - Septic shock - resolved - Bacteremia due to Klebsiella pneumoniae  - Osteomyelitis of lumbar spine - Discitis  - UTI - Bladder outlet obstruction - Type 2 diabetes mellitus - Moderate malnutrition - Deconditioning  For questions or updates, please contact [facility] Please consult [email or web address] for contact info under        Signed, Darreld Mclean, PA-C  11/21/2021, 9:24 AM

## 2021-11-21 NOTE — Progress Notes (Signed)
PROGRESS NOTE  Glenn [name]  QJF:[id number] DOB: 1940/11/13 DOA: 10/23/2021 PCP: System, Provider Not In   Brief Narrative:  Patient is a 81 y.o. M with CAD s/p CABG 96, ICM and sCHF EF <20%, DM, CKD IIIb baseline 1.7, pAF on Eliquis, hx PPM and BPH who presented with 3 days weakness, back pain to [facility].  Patient does intermittent catheterization at home whenever he feels like doing usually every 48 hours.  On presentation he was hyportensive.  Lab work showed leukocytosis.  Creatinine was in the range of 3.6.  CT imaging showed distended bladder, discitis/osteomyelitis of the spine.  He was transferred to [facility], ICU for pressures.  Foley was placed for urinary retention.  MRI did not show any drainable focus, pressure started off, transferred out of ICU.  Underwent disc aspiration on 6/8 by IR, cultures showed Klebsiella.  ID was consulted and recommended antibiotics.  PT/OT recommended CIR on discharge.  Because of severe volume overload, IV Lasix was started.  Cardiology also consulted .  Ultimate plan is to discharge to CIR after adequate fluid removal  Assessment & Plan:  Principal Problem:   Septic shock (HCC) Active Problems:   Bacteremia due to Klebsiella pneumoniae   Discitis   Osteomyelitis of lumbar spine (HCC)   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HCC)   Bladder outlet obstruction   Acute kidney injury (HCC)   Uncontrolled type 2 diabetes mellitus with hyperglycemia, with long-term current use of insulin (HCC)   Pacemaker   Chronic kidney disease, stage 3b (HCC)   Coronary artery disease involving native coronary artery of native heart without angina pectoris   Chronic combined systolic and diastolic CHF (congestive heart failure) (HCC)   Hyponatremia   Hypokalemia   Transaminitis   Myocardial injury   Thrombocytopenia (HCC)   Anemia due to chronic kidney disease   Coagulopathy (HCC)   Protein-calorie malnutrition, moderate (HCC)   Paroxysmal atrial fibrillation (HCC)   Foot ulcer  (HCC)  Septic shock/Klebsiella bacteremia/UTI: Presented to [facility] initially with back pain, weakness.  Found to be very hypotensive.  Septic shock suspected.  Started on broad-spectrum antibiotics.  Sent to ICU at [facility] with pressor support.  Currently hemodynamically stable Found to have osteomyelitis/discitis of lumbar spine, psoas abscess.  Underwent disc space drainage by IR.  Culture resulted Klebsiella.  ID was consulted.  PICC line placed.  Recommended cefazolin till 7/18.  Sepsis physiology has resolved.  Leukocytosis resolved.  Chronic combined systolic/diastolic congestive heart failure/volume overload: Known history of systolic congestive heart failure.  EF is less than 20%, has grade 2 diastolic dysfunction.  Status post ICD placement.  Looks severely volume overloaded  along with bilateral lower extremity edema, scrotal edema.On lasix 80 mg twice daily,will continue .  Hypoalbuminemia is also contributing.  Also on albumin twice daily along with Lasix.  monitor input/output, daily weight.  Takes torsemide at home.  He follows with cardiology.  Also takes Entresto at home which is on hold.  Cardiology also following  AKI on CKD stage IIIb: Baseline creatinine arounfd 1.8.  Presented with creatinine in the range of 3.6. last creatinine in the range of 2.2.    Continue Lasix.  Kidney function close to baseline now.  Coronary artery disease: No anginal symptoms.  On Lipitor, beta-blocker  Paroxysmal A-fib: Currently rate is controlled.  On Eliquis for anticoagulation.  Monitor on telemetry.  Was on Coreg for rate control, currently on hold due to hypotension  Bladder outlet obstruction: On presentation, imaging showed distended bladder.  Takes finasteride, Flomax at home.  Does self cath at home.  Foley was placed here.  Continue Foley until IV Lasix discontinued.  Then will check voiding trial.  Type 2 diabetes/hyperglycemia: Last hemoglobin A1c of more than 10.  Continue current insulin  regimen.  Monitor blood sugars  Moderate malnutrition: Nutritionist following  Deconditioning/debility: PT/OT recommended CIR on discharge.    Nutrition Problem: Moderate Malnutrition Etiology: chronic illness (CHF) Pressure Injury 11/02/21 Foot Left;Lateral Deep Tissue Pressure Injury - Purple or maroon localized area of discolored intact skin or blood-filled blister due to damage of underlying soft tissue from pressure and/or shear. 5.5x4 (Active)  11/02/21   Location: Foot  Location Orientation: Left;Lateral  Staging: Deep Tissue Pressure Injury - Purple or maroon localized area of discolored intact skin or blood-filled blister due to damage of underlying soft tissue from pressure and/or shear.  Wound Description (Comments): 5.5x4  Present on Admission: Yes  Dressing Type Gauze (Comment) 11/21/21 0800    DVT prophylaxis:Place and maintain sequential compression device Start: 11/02/21 1108 apixaban (ELIQUIS) tablet 5 mg     Code Status: Full Code  Family Communication: Called and discussed with daughter on phone on 6/20  Patient status:Inpatient  Patient is from :Home  Anticipated discharge to:CIR  Estimated DC date: After improvement in the volume overload   Consultants: PCCM, IR, ID  Procedures: Dx aspiration  Antimicrobials:  Anti-infectives (From admission, onward)    Start     Dose/Rate Route Frequency Ordered Stop   11/20/21 2000  ceFAZolin (ANCEF) IVPB 2g/100 mL premix        2 g 200 mL/hr over 30 Minutes Intravenous Every 12 hours 11/20/21 1230     11/20/21 1600  ceFAZolin (ANCEF) IVPB 2g/100 mL premix  Status:  Discontinued        2 g 200 mL/hr over 30 Minutes Intravenous Every 8 hours 11/20/21 1209 11/20/21 1230   11/12/21 1800  ceFAZolin (ANCEF) IVPB 2g/100 mL premix  Status:  Discontinued        2 g 200 mL/hr over 30 Minutes Intravenous Every 12 hours 11/12/21 0936 11/20/21 1209   11/04/21 1400  ceFAZolin (ANCEF) IVPB 2g/100 mL premix  Status:   Discontinued        2 g 200 mL/hr over 30 Minutes Intravenous Every 8 hours 11/04/21 1058 11/12/21 0936   11/03/21 1800  vancomycin (VANCOCIN) IVPB 1000 mg/200 mL premix  Status:  Discontinued        1,000 mg 200 mL/hr over 60 Minutes Intravenous Every 48 hours 11/02/21 0259 11/02/21 0452   11/02/21 2200  cefTRIAXone (ROCEPHIN) 2 g in sodium chloride 0.9 % 100 mL IVPB  Status:  Discontinued        2 g 200 mL/hr over 30 Minutes Intravenous Every 24 hours 11/02/21 0452 11/04/21 1058   11/02/21 0230  ceFEPIme (MAXIPIME) 2 g in sodium chloride 0.9 % 100 mL IVPB  Status:  Discontinued        2 g 200 mL/hr over 30 Minutes Intravenous Every 24 hours 11/02/21 0131 11/02/21 0452       Subjective:  Patient seen and examined at the bedside this morning.  Hemodynamically stable.  Remains comfortable, lying in bed.  Denies any lightheadedness or dizziness.  Continues to have bilateral lower extremity edema, scrotal/penile edema despite being on diuresis.  Objective: Vitals:   11/21/21 0020 11/21/21 0500 11/21/21 0618 11/21/21 0940  BP:   102/61 120/69  Pulse:   86 90  Resp:   16  18  Temp:   98.5 F (36.9 C) 98.3 F (36.8 C)  TempSrc:   Oral Oral  SpO2:   100% 100%  Weight: 70.6 kg 70.6 kg    Height:        Intake/Output Summary (Last 24 hours) at 11/21/2021 1154 Last data filed at 11/21/2021 0900 Gross per 24 hour  Intake 730 ml  Output 1675 ml  Net -945 ml   Filed Weights   11/20/21 0556 11/21/21 0020 11/21/21 0500  Weight: 69.9 kg 70.6 kg 70.6 kg    Examination: General exam: Overall comfortable, not in distress, weak, deconditioned, chronically ill looking HEENT: PERRL Respiratory system:  no wheezes or crackles  Cardiovascular system: S1 & S2 heard, RRR.  Gastrointestinal system: Abdomen is nondistended, soft and nontender. Central nervous system: Alert and oriented Extremities: Bilateral lower extremity edema, no clubbing ,no cyanosis Skin: No rashes, no ulcers,no icterus    GU: Foley, penile, scrotal edema   Data Reviewed: I have personally reviewed following labs and imaging studies  CBC: Recent Labs  Lab 11/16/21 0459  WBC 8.4  HGB 8.0*  HCT 25.1*  MCV 83.9  PLT 99991111   Basic Metabolic Panel: Recent Labs  Lab 11/17/21 0437 11/18/21 0407 11/19/21 0315 11/20/21 0424 11/21/21 0355  NA 134* 135 136 133* 137  K 4.3 4.4 4.3 3.8 3.8  CL 102 100 98 96* 98  CO2 25 27 28 27 28   GLUCOSE 58* 131* 126* 175* 139*  BUN 84* 80* 81* 76* 76*  CREATININE 2.07* 2.07* 1.95* 1.89* 1.78*  CALCIUM 8.1* 8.2* 8.3* 8.3* 8.5*     No results found for this or any previous visit (from the past 240 hour(s)).    Radiology Studies: No results found.  Scheduled Meds:  (feeding supplement) PROSource Plus  30 mL Oral TID BM   apixaban  5 mg Oral BID   artificial tears   Both Eyes QHS   vitamin C  500 mg Oral BID   atorvastatin  80 mg Oral QHS   chlorhexidine  15 mL Mouth Rinse BID   Chlorhexidine Gluconate Cloth  6 each Topical Q0600   finasteride  5 mg Oral Daily   furosemide  80 mg Intravenous Q8H   insulin aspart  0-15 Units Subcutaneous TID WC   insulin aspart  0-5 Units Subcutaneous QHS   insulin detemir  15 Units Subcutaneous Daily   ketotifen  1 drop Both Eyes BID   latanoprost  1 drop Both Eyes QHS   mouth rinse  15 mL Mouth Rinse q12n4p   multivitamin with minerals  1 tablet Oral Daily   pantoprazole  40 mg Oral Q1200   polyethylene glycol  17 g Oral BID   sodium chloride flush  10-40 mL Intracatheter Q12H   sodium chloride flush  10-40 mL Intracatheter Q12H   tamsulosin  0.8 mg Oral QHS   zinc sulfate  220 mg Oral Daily   Continuous Infusions:  sodium chloride 250 mL (11/02/21 0205)   sodium chloride 10 mL/hr at 11/19/21 1700   albumin human 25 g (11/21/21 0853)    ceFAZolin (ANCEF) IV 2 g (11/21/21 0817)     LOS: 20 days   Shelly Coss, MD [facility] P6/20/2023, 11:54 AM

## 2021-11-21 NOTE — Progress Notes (Signed)
Occupational Therapy Treatment Patient Details Name: Glenn [name] MRN: [id number] DOB: [birth year] Today's Date: 11/21/2021   History of present illness Patient is a 81 y/o male who presents on 5/31 with generalized weakness and falls. 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 PMH includes DM, HTN, MI, CAD s/p CABG, recent PPM placement and DCCV for Afib, CHF, CKD, BPH. L2-L3 disc aspiration planned for 11/09/21.   OT comments  Pt currently needs mod assist for sit to stand from the bedside recliner and for transfers to the 3:1.  Max assist for toilet hygiene as well with noted bowel incontinence and bleeding external hemorrhoid.  He continues to demonstrate limited endurance and overall decreased strength but is motivated to participate and would be a good candidate for AIR therapy to increase intensity level once medically ready.  Recommend continued acute care OT at this time until then to progress toward min assist level goals.     Recommendations for follow up therapy are one component of a multi-disciplinary discharge planning process, led by the attending physician.  Recommendations may be updated based on patient status, additional functional criteria and insurance authorization.    Follow Up Recommendations  Acute inpatient rehab (3hours/day)    Assistance Recommended at Discharge Intermittent Supervision/Assistance  Patient can return home with the following  A little help with walking and/or transfers;A little help with bathing/dressing/bathroom;Assistance with cooking/housework;Direct supervision/assist for medications management;Direct supervision/assist for financial management;Assist for transportation   Equipment Recommendations  Other (comment) (TBD next venue of care)       Precautions / Restrictions Precautions Precautions: Fall Restrictions Weight Bearing  Restrictions: No       Mobility Bed Mobility                    Transfers Overall transfer level: Needs assistance Equipment used: Rolling walker (2 wheels) Transfers: Sit to/from Stand Sit to Stand: Mod assist (from recliner)     Step pivot transfers: Mod assist     General transfer comment: Pt with decreased timing with sit to stand resulting in increased knee extension with lack of hip extension.  Posterior translation noted with pt stabilizing his legs on the chair to come forward to balance himself over the balls of his feet.     Balance Overall balance assessment: Needs assistance Sitting-balance support: No upper extremity supported Sitting balance-Leahy Scale: Fair     Standing balance support: Reliant on assistive device for balance, Bilateral upper extremity supported Standing balance-Leahy Scale: Poor Standing balance comment: Needs RW for support.                           ADL either performed or assessed with clinical judgement   ADL Overall ADL's : Needs assistance/impaired                         Toilet Transfer: Moderate assistance;BSC/3in1;Rolling walker (2 wheels);Ambulation   Toileting- Clothing Manipulation and Hygiene: Maximal assistance;Sit to/from stand       Functional mobility during ADLs: Minimal assistance;Rolling walker (2 wheels) (once standing) General ADL Comments: Pt incontinent of bowel but able to ambulate to the 3:1 over the toilet with mod assist including sit to stand.  Flexed trunk and head with inability to achieve or maintain upright posture.  Cognition Arousal/Alertness: Awake/alert Behavior During Therapy: WFL for tasks assessed/performed Overall Cognitive Status: Within Functional Limits for tasks assessed                                 General Comments: Pt pleasant and cooperative.  Did not note any cognitive deficits.                   Pertinent  Vitals/ Pain       Pain Assessment Pain Assessment: Faces Faces Pain Scale: Hurts a little bit Pain Location: back Pain Descriptors / Indicators: Discomfort, Grimacing Pain Intervention(s): Repositioned, Monitored during session         Frequency  Min 2X/week        Progress Toward Goals  OT Goals(current goals can now be found in the care plan section)  Progress towards OT goals: Progressing toward goals  Acute Rehab OT Goals Patient Stated Goal: Patient wants to get to rehab OT Goal Formulation: With patient Time For Goal Achievement: 12/05/21 Potential to Achieve Goals: Good  Plan Discharge plan remains appropriate       AM-PAC OT "6 Clicks" Daily Activity     Outcome Measure   Help from another person eating meals?: None Help from another person taking care of personal grooming?: A Little Help from another person toileting, which includes using toliet, bedpan, or urinal?: A Lot Help from another person bathing (including washing, rinsing, drying)?: A Lot Help from another person to put on and taking off regular upper body clothing?: A Little Help from another person to put on and taking off regular lower body clothing?: A Lot 6 Click Score: 16    End of Session Equipment Utilized During Treatment: Rolling walker (2 wheels)  OT Visit Diagnosis: Unsteadiness on feet (R26.81);Muscle weakness (generalized) (M62.81);Pain;Other symptoms and signs involving cognitive function   Activity Tolerance Patient tolerated treatment well   Patient Left in chair;with call bell/phone within reach;with chair alarm set   Nurse Communication Mobility status        Time: 6803-2122 OT Time Calculation (min): 31 min  Charges: OT General Charges $OT Visit: 1 Visit OT Treatments $Self Care/Home Management : 23-37 mins Kelsay Haggard OTR/L 11/21/2021, 4:14 PM

## 2021-11-21 NOTE — Progress Notes (Signed)
Physical Therapy Treatment Patient Details Name: Glenn [name] MRN: [id number] DOB: [birth year] Today's Date: 11/21/2021   History of Present Illness Patient is a 81 y/o male who presents on 5/31 with generalized weakness and falls. 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 PMH includes DM, HTN, MI, CAD s/p CABG, recent PPM placement and DCCV for Afib, CHF, CKD, BPH. L2-L3 disc aspiration planned for 11/09/21.    PT Comments    Patient initially anxious regarding walking without a chair to close follow, however ultimately proud of how far he could walk today. Standing rest break x 2 due to dyspnea (in 80 ft). Required several rest breaks during LE exercise program as well. Very motivated and continues to benefit from PT.     Recommendations for follow up therapy are one component of a multi-disciplinary discharge planning process, led by the attending physician.  Recommendations may be updated based on patient status, additional functional criteria and insurance authorization.  Follow Up Recommendations  Acute inpatient rehab (3hours/day)     Assistance Recommended at Discharge Frequent or constant Supervision/Assistance  Patient can return home with the following A little help with walking and/or transfers;A little help with bathing/dressing/bathroom;Assistance with cooking/housework;Assist for transportation;Help with stairs or ramp for entrance   Equipment Recommendations  None recommended by PT    Recommendations for Other Services       Precautions / Restrictions Precautions Precautions: None Restrictions Weight Bearing Restrictions: No     Mobility  Bed Mobility Overal bed mobility: Needs Assistance Bed Mobility: Supine to Sit     Supine to sit: Min guard     General bed mobility comments: moving slowly, but did not need physical assist     Transfers Overall transfer level: Needs assistance Equipment used: Rolling walker (2 wheels) Transfers: Sit to/from Stand Sit to Stand: Min assist, From elevated surface (bed elevated to simulate home)           General transfer comment: light min assist with cues for sequencing with RW    Ambulation/Gait Ambulation/Gait assistance: +2 safety/equipment, Min guard Gait Distance (Feet): 80 Feet Assistive device: Rolling walker (2 wheels) Gait Pattern/deviations: Step-to pattern, Shuffle, Decreased stride length, Trunk flexed Gait velocity: decreased     General Gait Details: short shuffle steps. Minguard for safety; pt able to manage RW with incr effort and time.   Stairs             Wheelchair Mobility    Modified Rankin (Stroke Patients Only)       Balance Overall balance assessment: Needs assistance Sitting-balance support: No upper extremity supported, Feet supported Sitting balance-Leahy Scale: Fair Sitting balance - Comments: required min guard for safety   Standing balance support: Reliant on assistive device for balance, Bilateral upper extremity supported Standing balance-Leahy Scale: Poor Standing balance comment: reliant on RW for support                            Cognition Arousal/Alertness: Awake/alert Behavior During Therapy: WFL for tasks assessed/performed Overall Cognitive Status: Within Functional Limits for tasks assessed                                 General Comments: "I'll do whatever you say - I want to get better"        Exercises  General Exercises - Lower Extremity Ankle Circles/Pumps: AROM, Both, 10 reps Heel Slides: AROM, Both, 10 reps Hip ABduction/ADduction: AROM, Both, 10 reps, Supine Straight Leg Raises: AROM, Both, 10 reps, Supine (5 reps x 2 sets; rest breaks between)    General Comments        Pertinent Vitals/Pain Pain Assessment Pain Assessment: No/denies pain Breathing:  normal Negative Vocalization: none Facial Expression: smiling or inexpressive Body Language: relaxed Consolability: no need to console PAINAD Score: 0    Home Living                          Prior Function            PT Goals (current goals can now be found in the care plan section) Acute Rehab PT Goals Patient Stated Goal: to go home Time For Goal Achievement: 12/05/21 Potential to Achieve Goals: Good Progress towards PT goals: Progressing toward goals (goals updated based on timeframe)    Frequency    Min 3X/week      PT Plan Current plan remains appropriate    Co-evaluation              AM-PAC PT "6 Clicks" Mobility   Outcome Measure  Help needed turning from your back to your side while in a flat bed without using bedrails?: A Little Help needed moving from lying on your back to sitting on the side of a flat bed without using bedrails?: A Little Help needed moving to and from a bed to a chair (including a wheelchair)?: A Little Help needed standing up from a chair using your arms (e.g., wheelchair or bedside chair)?: A Little Help needed to walk in hospital room?: A Little Help needed climbing 3-5 steps with a railing? : Total 6 Click Score: 16    End of Session Equipment Utilized During Treatment: Gait belt Activity Tolerance: Patient tolerated treatment well Patient left: in chair;with call bell/phone within reach;with chair alarm set Nurse Communication: Mobility status PT Visit Diagnosis: Unsteadiness on feet (R26.81);Other abnormalities of gait and mobility (R26.89);Muscle weakness (generalized) (M62.81);Pain     Time: 7824-2353 PT Time Calculation (min) (ACUTE ONLY): 32 min  Charges:  $Gait Training: 8-22 mins $Therapeutic Exercise: 8-22 mins                      Jerolyn Center, PT Acute Rehabilitation Services  Office [phone]    Zena Amos 11/21/2021, 10:29 AM

## 2021-11-22 DIAGNOSIS — N1832 Chronic kidney disease, stage 3b: Secondary | ICD-10-CM | POA: Diagnosis not present

## 2021-11-22 DIAGNOSIS — N179 Acute kidney failure, unspecified: Secondary | ICD-10-CM | POA: Diagnosis not present

## 2021-11-22 DIAGNOSIS — I2510 Atherosclerotic heart disease of native coronary artery without angina pectoris: Secondary | ICD-10-CM | POA: Diagnosis not present

## 2021-11-22 DIAGNOSIS — I480 Paroxysmal atrial fibrillation: Secondary | ICD-10-CM

## 2021-11-22 DIAGNOSIS — E876 Hypokalemia: Secondary | ICD-10-CM | POA: Diagnosis not present

## 2021-11-22 DIAGNOSIS — I5043 Acute on chronic combined systolic (congestive) and diastolic (congestive) heart failure: Secondary | ICD-10-CM

## 2021-11-22 DIAGNOSIS — R7881 Bacteremia: Secondary | ICD-10-CM | POA: Diagnosis not present

## 2021-11-22 LAB — LIPID PANEL
Cholesterol: 77 mg/dL (ref 0–200)
HDL: 35 mg/dL — ABNORMAL LOW (ref >40)
LDL Cholesterol: 33 mg/dL (ref 0–99)
Total CHOL/HDL Ratio: 2.2 RATIO
Triglycerides: 45 mg/dL (ref <150)
VLDL: 9 mg/dL (ref 0–40)

## 2021-11-22 LAB — GLUCOSE, CAPILLARY
Glucose-Capillary: 136 mg/dL — ABNORMAL HIGH (ref 70–99)
Glucose-Capillary: 179 mg/dL — ABNORMAL HIGH (ref 70–99)
Glucose-Capillary: 156 mg/dL — ABNORMAL HIGH (ref 70–99)
Glucose-Capillary: 89 mg/dL (ref 70–99)

## 2021-11-22 LAB — BASIC METABOLIC PANEL
Anion gap: 11 (ref 5–15)
BUN: 74 mg/dL — ABNORMAL HIGH (ref 8–23)
CO2: 28 mmol/L (ref 22–32)
Calcium: 8.4 mg/dL — ABNORMAL LOW (ref 8.9–10.3)
Chloride: 95 mmol/L — ABNORMAL LOW (ref 98–111)
Creatinine, Ser: 1.69 mg/dL — ABNORMAL HIGH (ref 0.61–1.24)
GFR, Estimated: 41 mL/min — ABNORMAL LOW (ref >60)
Glucose, Bld: 126 mg/dL — ABNORMAL HIGH (ref 70–99)
Potassium: 4 mmol/L (ref 3.5–5.1)
Sodium: 134 mmol/L — ABNORMAL LOW (ref 135–145)

## 2021-11-22 LAB — HEPATIC FUNCTION PANEL
ALT: 6 U/L (ref 0–44)
AST: 24 U/L (ref 15–41)
Albumin: 2.7 g/dL — ABNORMAL LOW (ref 3.5–5.0)
Alkaline Phosphatase: 145 U/L — ABNORMAL HIGH (ref 38–126)
Bilirubin, Direct: <0.1 mg/dL (ref 0.0–0.2)
Total Bilirubin: 0.4 mg/dL (ref 0.3–1.2)
Total Protein: 6.7 g/dL (ref 6.5–8.1)

## 2021-11-22 MED ORDER — CEFAZOLIN SODIUM-DEXTROSE 2-4 GM/100ML-% IV SOLN
2.0000 g | Freq: Three times a day (TID) | INTRAVENOUS | Status: DC
  Administered 2021-11-22 – 2021-11-23 (×3): 2 g via INTRAVENOUS
  Filled 2021-11-22 (×3): qty 100

## 2021-11-22 NOTE — Progress Notes (Signed)
PROGRESS NOTE  Glenn [name]  SAY:[id number] DOB: 19-Mar-1941 DOA: Nov 20, 2021 PCP: System, Provider Not In   Brief Narrative:  Patient is a 81 y.o. M with CAD s/p CABG 96, ICM and sCHF EF <20%, DM, CKD IIIb baseline 1.7, pAF on Eliquis, hx PPM and BPH who presented with 3 days weakness, back pain to [facility].  Patient does intermittent catheterization at home whenever he feels like doing usually every 48 hours.  On presentation he was hyportensive.  Lab work showed leukocytosis.  Creatinine was in the range of 3.6.  CT imaging showed distended bladder, discitis/osteomyelitis of the spine.  He was transferred to [facility], ICU for pressures.  Foley was placed for urinary retention.  MRI did not show any drainable focus, pressure started off, transferred out of ICU.  Underwent disc aspiration on 6/8 by IR, cultures showed Klebsiella.  ID was consulted and recommended antibiotics.  PT/OT recommended CIR on discharge.  Because of severe volume overload, IV Lasix was started.  Cardiology also consulted  for resistant anasarca.Marland Kitchen  Ultimate plan is to discharge to CIR after adequate fluid removal  Assessment & Plan:  Principal Problem:   Septic shock (HCC) Active Problems:   Bacteremia due to Klebsiella pneumoniae   Discitis   Osteomyelitis of lumbar spine (HCC)   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HCC)   Bladder outlet obstruction   Acute kidney injury (HCC)   Uncontrolled type 2 diabetes mellitus with hyperglycemia, with long-term current use of insulin (HCC)   Pacemaker   Chronic kidney disease, stage 3b (HCC)   Coronary artery disease involving native coronary artery of native heart without angina pectoris   Chronic combined systolic and diastolic CHF (congestive heart failure) (HCC)   Hyponatremia   Hypokalemia   Transaminitis   Myocardial injury   Thrombocytopenia (HCC)   Anemia due to chronic kidney disease   Coagulopathy (HCC)   Protein-calorie malnutrition, moderate (HCC)   Paroxysmal atrial fibrillation  (HCC)   Foot ulcer (HCC)  Septic shock/Klebsiella bacteremia/UTI: Presented to [facility] initially with back pain, weakness.  Found to be very hypotensive.  Septic shock suspected.  Started on broad-spectrum antibiotics.  Sent to ICU at [facility] with pressor support.  Currently hemodynamically stable Found to have osteomyelitis/discitis of lumbar spine, psoas abscess.  Underwent disc space drainage by IR.  Culture resulted Klebsiella.  ID was consulted.  PICC line placed.  Recommended cefazolin till 7/18.  Sepsis physiology has resolved.  Leukocytosis resolved.  Chronic combined systolic/diastolic congestive heart failure/volume overload: Known history of systolic congestive heart failure.  EF is less than 20%, has grade 2 diastolic dysfunction.  Status post ICD placement.  Looks severely volume overloaded  along with bilateral lower extremity edema, scrotal edema.On lasix 80 mg TID,will continue .  Hypoalbuminemia is also contributing.  Also on albumin twice daily along with Lasix.  monitor input/output, daily weight.  Takes torsemide at home.  He follows with cardiology.  Also takes Entresto at home which is on hold.  Cardiology also following  AKI on CKD stage IIIb: Baseline creatinine arounfd 1.8.  Presented with creatinine in the range of 3.6. last creatinine in the range of 2.2.    Continue Lasix.  Kidney function close to baseline now.  Coronary artery disease: No anginal symptoms.  On Lipitor, beta-blocker  Paroxysmal A-fib: Currently rate is controlled.  On Eliquis for anticoagulation.  Monitor on telemetry.  Was on Coreg for rate control, currently on hold due to hypotension  Bladder outlet obstruction: On presentation, imaging showed distended  bladder.  Takes finasteride, Flomax at home.  Does self cath at home.  Foley was placed here.  Continue Foley until IV Lasix discontinued.  Then will check voiding trial.  Type 2 diabetes/hyperglycemia: Last hemoglobin A1c of more than 10.  Continue current  insulin regimen.  Monitor blood sugars  Moderate malnutrition: Nutritionist following  Deconditioning/debility: PT/OT recommended CIR on discharge.    Nutrition Problem: Moderate Malnutrition Etiology: chronic illness (CHF) Pressure Injury 11/02/21 Foot Left;Lateral Deep Tissue Pressure Injury - Purple or maroon localized area of discolored intact skin or blood-filled blister due to damage of underlying soft tissue from pressure and/or shear. 5.5x4 (Active)  11/02/21   Location: Foot  Location Orientation: Left;Lateral  Staging: Deep Tissue Pressure Injury - Purple or maroon localized area of discolored intact skin or blood-filled blister due to damage of underlying soft tissue from pressure and/or shear.  Wound Description (Comments): 5.5x4  Present on Admission: Yes  Dressing Type Gauze (Comment) 11/22/21 0731    DVT prophylaxis:Place and maintain sequential compression device Start: 11/02/21 1108 apixaban (ELIQUIS) tablet 5 mg     Code Status: Full Code  Family Communication: Called and discussed with daughter on phone on 6/20  Patient status:Inpatient  Patient is from :Home  Anticipated discharge to:CIR  Estimated DC date: After improvement in the volume overload   Consultants: PCCM, IR, ID  Procedures: Dx aspiration  Antimicrobials:  Anti-infectives (From admission, onward)    Start     Dose/Rate Route Frequency Ordered Stop   11/22/21 1400  ceFAZolin (ANCEF) IVPB 2g/100 mL premix        2 g 200 mL/hr over 30 Minutes Intravenous Every 8 hours 11/22/21 0828     11/20/21 2000  ceFAZolin (ANCEF) IVPB 2g/100 mL premix  Status:  Discontinued        2 g 200 mL/hr over 30 Minutes Intravenous Every 12 hours 11/20/21 1230 11/22/21 0828   11/20/21 1600  ceFAZolin (ANCEF) IVPB 2g/100 mL premix  Status:  Discontinued        2 g 200 mL/hr over 30 Minutes Intravenous Every 8 hours 11/20/21 1209 11/20/21 1230   11/12/21 1800  ceFAZolin (ANCEF) IVPB 2g/100 mL premix  Status:   Discontinued        2 g 200 mL/hr over 30 Minutes Intravenous Every 12 hours 11/12/21 0936 11/20/21 1209   11/04/21 1400  ceFAZolin (ANCEF) IVPB 2g/100 mL premix  Status:  Discontinued        2 g 200 mL/hr over 30 Minutes Intravenous Every 8 hours 11/04/21 1058 11/12/21 0936   11/03/21 1800  vancomycin (VANCOCIN) IVPB 1000 mg/200 mL premix  Status:  Discontinued        1,000 mg 200 mL/hr over 60 Minutes Intravenous Every 48 hours 11/02/21 0259 11/02/21 0452   11/02/21 2200  cefTRIAXone (ROCEPHIN) 2 g in sodium chloride 0.9 % 100 mL IVPB  Status:  Discontinued        2 g 200 mL/hr over 30 Minutes Intravenous Every 24 hours 11/02/21 0452 11/04/21 1058   11/02/21 0230  ceFEPIme (MAXIPIME) 2 g in sodium chloride 0.9 % 100 mL IVPB  Status:  Discontinued        2 g 200 mL/hr over 30 Minutes Intravenous Every 24 hours 11/02/21 0131 11/02/21 0452       Subjective:  Patient seen and examined at the bedside this morning.  His scrotal and penile swelling have slightly improved from yesterday.  Still has persistent bilateral lower extremity edema.  On room air.  Denies any complaints today  Objective: Vitals:   11/21/21 1622 11/21/21 2254 11/22/21 0538 11/22/21 0910  BP: 123/69 116/64 105/62 106/63  Pulse: 94 95 87 94  Resp: 18 18 18 18   Temp: 98.2 F (36.8 C) 97.8 F (36.6 C) 98.7 F (37.1 C) 98.8 F (37.1 C)  TempSrc:  Oral Oral Oral  SpO2: 100% 100% 99% 99%  Weight:   70.4 kg   Height:        Intake/Output Summary (Last 24 hours) at 11/22/2021 1129 Last data filed at 11/22/2021 [id number] Gross per 24 hour  Intake 800 ml  Output 1550 ml  Net -750 ml   Filed Weights   11/21/21 0020 11/21/21 0500 11/22/21 0538  Weight: 70.6 kg 70.6 kg 70.4 kg    Examination:  General exam: Overall comfortable, not in distress, very deconditioned, chronically ill looking HEENT: PERRL Respiratory system: Mild bibasilar crackles Cardiovascular system: S1 & S2 heard, RRR.  Gastrointestinal system:  Abdomen is nondistended, soft and nontender. Central nervous system: Alert and oriented Extremities: Bilateral lower extremity pitting edema, no clubbing ,no cyanosis Skin: No rashes, no ulcers,no icterus   GU: Foley, penile/ scrotal swelling   Data Reviewed: I have personally reviewed following labs and imaging studies  CBC: Recent Labs  Lab 11/16/21 0459  WBC 8.4  HGB 8.0*  HCT 25.1*  MCV 83.9  PLT 99991111   Basic Metabolic Panel: Recent Labs  Lab 11/18/21 0407 11/19/21 0315 11/20/21 0424 11/21/21 0355 11/22/21 0359  NA 135 136 133* 137 134*  K 4.4 4.3 3.8 3.8 4.0  CL 100 98 96* 98 95*  CO2 27 28 27 28 28   GLUCOSE 131* 126* 175* 139* 126*  BUN 80* 81* 76* 76* 74*  CREATININE 2.07* 1.95* 1.89* 1.78* 1.69*  CALCIUM 8.2* 8.3* 8.3* 8.5* 8.4*     No results found for this or any previous visit (from the past 240 hour(s)).    Radiology Studies: No results found.  Scheduled Meds:  (feeding supplement) PROSource Plus  30 mL Oral TID BM   apixaban  5 mg Oral BID   artificial tears   Both Eyes QHS   vitamin C  500 mg Oral BID   atorvastatin  80 mg Oral QHS   chlorhexidine  15 mL Mouth Rinse BID   Chlorhexidine Gluconate Cloth  6 each Topical Q0600   finasteride  5 mg Oral Daily   furosemide  80 mg Intravenous Q8H   insulin aspart  0-15 Units Subcutaneous TID WC   insulin aspart  0-5 Units Subcutaneous QHS   insulin detemir  15 Units Subcutaneous Daily   ketotifen  1 drop Both Eyes BID   latanoprost  1 drop Both Eyes QHS   mouth rinse  15 mL Mouth Rinse q12n4p   multivitamin with minerals  1 tablet Oral Daily   pantoprazole  40 mg Oral Q1200   polyethylene glycol  17 g Oral BID   potassium chloride  40 mEq Oral Daily   sodium chloride flush  10-40 mL Intracatheter Q12H   sodium chloride flush  10-40 mL Intracatheter Q12H   tamsulosin  0.8 mg Oral QHS   zinc sulfate  220 mg Oral Daily   Continuous Infusions:  sodium chloride 250 mL (11/02/21 0205)   sodium  chloride 10 mL/hr at 11/19/21 1700   albumin human 25 g (11/22/21 0858)    ceFAZolin (ANCEF) IV       LOS: 21 days  Shelly Coss, MD [facility] P6/21/2023, 11:29 AM

## 2021-11-22 NOTE — Progress Notes (Signed)
PHARMACY NOTE:  ANTIMICROBIAL RENAL DOSAGE ADJUSTMENT  Current antimicrobial regimen includes a mismatch between antimicrobial dosage and estimated renal function.  As per policy approved by the Pharmacy & Therapeutics and Medical Executive Committees, the antimicrobial dosage will be adjusted accordingly.  Current antimicrobial dosage:  Cefazolin 2gm IV q12h  Indication: OM/discitis  Renal Function:  Estimated Creatinine Clearance: 35.9 mL/min (A) (by C-G formula based on SCr of 1.69 mg/dL (H)). []      On intermittent HD, scheduled: []      On CRRT    Antimicrobial dosage has been changed to:  Cefazolin 2gm IV q8h  Additional comments: OPAT updated   Keonta Alsip A. , PharmD, BCPS, FNKF Clinical Pharmacist [place] Please utilize Amion for appropriate phone number to reach the unit pharmacist [facility])  11/22/2021 8:29 AM

## 2021-11-22 NOTE — Progress Notes (Addendum)
Progress Note  Patient Name: Glenn [name] Date of Encounter: 11/22/2021  [facility] Cardiologist: None [facility]  Subjective  Breathing and swelling is improving on IV albumin and increase lasix. Discussed to increase protein in meal. No chest pain.   Inpatient Medications    Scheduled Meds:  (feeding supplement) PROSource Plus  30 mL Oral TID BM   apixaban  5 mg Oral BID   artificial tears   Both Eyes QHS   vitamin C  500 mg Oral BID   atorvastatin  80 mg Oral QHS   chlorhexidine  15 mL Mouth Rinse BID   Chlorhexidine Gluconate Cloth  6 each Topical Q0600   finasteride  5 mg Oral Daily   furosemide  80 mg Intravenous Q8H   insulin aspart  0-15 Units Subcutaneous TID WC   insulin aspart  0-5 Units Subcutaneous QHS   insulin detemir  15 Units Subcutaneous Daily   ketotifen  1 drop Both Eyes BID   latanoprost  1 drop Both Eyes QHS   mouth rinse  15 mL Mouth Rinse q12n4p   multivitamin with minerals  1 tablet Oral Daily   pantoprazole  40 mg Oral Q1200   polyethylene glycol  17 g Oral BID   potassium chloride  40 mEq Oral Daily   sodium chloride flush  10-40 mL Intracatheter Q12H   sodium chloride flush  10-40 mL Intracatheter Q12H   tamsulosin  0.8 mg Oral QHS   zinc sulfate  220 mg Oral Daily   Continuous Infusions:  sodium chloride 250 mL (11/02/21 0205)   sodium chloride 10 mL/hr at 11/19/21 1700   albumin human 25 g (11/22/21 0858)    ceFAZolin (ANCEF) IV     PRN Meds: oxyCODONE, polyvinyl alcohol, sodium chloride flush, sodium chloride flush   Vital Signs    Vitals:   11/21/21 1622 11/21/21 2254 11/22/21 0538 11/22/21 0910  BP: 123/69 116/64 105/62 106/63  Pulse: 94 95 87 94  Resp: 18 18 18 18   Temp: 98.2 F (36.8 C) 97.8 F (36.6 C) 98.7 F (37.1 C) 98.8 F (37.1 C)  TempSrc:  Oral Oral Oral  SpO2: 100% 100% 99% 99%  Weight:   70.4 kg   Height:        Intake/Output Summary (Last 24 hours) at 11/22/2021 1036 Last data filed at 11/22/2021 [id number] Gross  per 24 hour  Intake 800 ml  Output 1550 ml  Net -750 ml      11/22/2021    5:38 AM 11/21/2021    5:00 AM 11/21/2021   12:20 AM  Last 3 Weights  Weight (lbs) 155 lb 3.3 oz 155 lb 10.3 oz 155 lb 10.3 oz  Weight (kg) 70.4 kg 70.6 kg 70.6 kg      Telemetry    Paced rhythm - Personally Reviewed  ECG    N/A  Physical Exam   GEN: No acute distress.   Neck: No JVD Cardiac: RRR, no murmurs, rubs, or gallops.  Respiratory: Clear to auscultation bilaterally. GI: Soft, nontender, non-distended  MS: 1 + edema; No deformity. Neuro:  Nonfocal  Psych: Normal affect   Labs    High Sensitivity Troponin:   Recent Labs  Lab 11/02/21 0119 11/02/21 0347  TROPONINIHS 83* 83*     Chemistry Recent Labs  Lab 11/20/21 0424 11/21/21 0355 11/22/21 0359  NA 133* 137 134*  K 3.8 3.8 4.0  CL 96* 98 95*  CO2 27 28 28   GLUCOSE 175* 139* 126*  BUN 76* 76* 74*  CREATININE 1.89* 1.78* 1.69*  CALCIUM 8.3* 8.5* 8.4*  PROT  --   --  6.7  ALBUMIN  --   --  2.7*  AST  --   --  24  ALT  --   --  6  ALKPHOS  --   --  145*  BILITOT  --   --  0.4  GFRNONAA 36* 39* 41*  ANIONGAP 10 11 11     Lipids  Recent Labs  Lab 11/22/21 0359  CHOL 77  TRIG 45  HDL 35*  LDLCALC 33  CHOLHDL 2.2    Hematology Recent Labs  Lab 11/16/21 0459  WBC 8.4  RBC 2.99*  HGB 8.0*  HCT 25.1*  MCV 83.9  MCH 26.8  MCHC 31.9  RDW 16.7*  PLT 302   Thyroid No results for input(s): "TSH", "FREET4" in the last 168 hours.  BNP Recent Labs  Lab 11/21/21 0355  BNP 1,403.5*    DDimer No results for input(s): "DDIMER" in the last 168 hours.   Radiology    No results found.  Cardiac Studies   Echo 11/02/21 1. There are some endomyocardial trabeculations noted but does not meet  criteria for left ventricular non-compaction. Left ventricular ejection  fraction, by estimation, is <20%. The left ventricle has severely  decreased function. The left ventricle has  no regional wall motion abnormalities.  Left ventricular diastolic  parameters are consistent with Grade II diastolic dysfunction  (pseudonormalization). There is the interventricular septum is flattened  in systole and diastole, consistent with right  ventricular pressure and volume overload.   2. Right ventricular systolic function is normal. The right ventricular  size is normal.   3. The mitral valve is normal in structure. Mild mitral valve  regurgitation. No evidence of mitral stenosis.   4. Tricuspid valve regurgitation is moderate.   5. The aortic valve is tricuspid. Aortic valve regurgitation is not  visualized. No aortic stenosis is present.   6. There is borderline dilatation of the aortic root, measuring 37 mm.   7. The inferior vena cava is normal in size with greater than 50%  respiratory variability, suggesting right atrial pressure of 3 mmHg.   Comparison(s): No prior Echocardiogram.   Patient Profile     Glenn [name] is a 81 y.o. male with a hx of CAD s/p CABG in 1996, paroxysmal atrial fibrillation, chronic systolic and diastolic heart failure with EF of 20%, s/p defibrillator/pacemaker, chronic kidney disease stage III, hypertension, diabetes mellitus and exposure to agent orange who is being seen 11/20/2021 for the evaluation of CHF at the request of Dr. 11/22/2021.  Assessment & Plan      Acute on chronic combined CHF -Received fluids initially for septic shock.   -Echo this admission showed LVEF of <20% with no regional wall motion abnormalities and grade 2 diastolic dysfunction. RV normal but interventricular septum is flattened in systolic and diastole consistent with RV pressure and volume overload. - Lasix increased to 80mg  TID and now on IV albumin - Symptoms improving. Net I & O -12.1L. weight same for past 3 days.  - Continue current therapy. Discussed to increase protein in meal.  - Home Coreg on hold due to acute CHF exacerbation and soft BP. - No ACEi/ARB/ARNI, MRA, or SGLT2 inhibitor at this  time due to AKI and urosepsis.   2.  CAD s/p CABG -No anginal symptoms -Not on aspirin due to need of anticoagulation -Held beta-blocker as above  -  Consider statin   3.  Paroxysmal atrial fibrillation -Paced rhythm on admission.   -Continue Eliquis for anticoagulation   4.  Acute on chronic kidney disease stage III -Creatinine 3.6 on admit.  Now improving with IV diuresis. 1.69 today.   For questions or updates, please contact [facility] Please consult [email or web address] for contact info under        SignedManson Passey, PA  11/22/2021, 10:36 AM

## 2021-11-22 NOTE — Progress Notes (Signed)
Inpatient Rehab Admissions Coordinator:   Continues to require IV lasix, frequency increased today.  Will continue to follow.   Estill Dooms, PT, DPT Admissions Coordinator [phone] 11/22/21  11:12 AM

## 2021-11-23 DIAGNOSIS — R6521 Severe sepsis with septic shock: Secondary | ICD-10-CM | POA: Diagnosis not present

## 2021-11-23 DIAGNOSIS — A419 Sepsis, unspecified organism: Secondary | ICD-10-CM | POA: Diagnosis not present

## 2021-11-23 DIAGNOSIS — N179 Acute kidney failure, unspecified: Secondary | ICD-10-CM | POA: Diagnosis not present

## 2021-11-23 DIAGNOSIS — N1832 Chronic kidney disease, stage 3b: Secondary | ICD-10-CM | POA: Diagnosis not present

## 2021-11-23 DIAGNOSIS — E876 Hypokalemia: Secondary | ICD-10-CM | POA: Diagnosis not present

## 2021-11-23 DIAGNOSIS — I2510 Atherosclerotic heart disease of native coronary artery without angina pectoris: Secondary | ICD-10-CM | POA: Diagnosis not present

## 2021-11-23 LAB — GLUCOSE, CAPILLARY
Glucose-Capillary: 177 mg/dL — ABNORMAL HIGH (ref 70–99)
Glucose-Capillary: 227 mg/dL — ABNORMAL HIGH (ref 70–99)
Glucose-Capillary: 178 mg/dL — ABNORMAL HIGH (ref 70–99)
Glucose-Capillary: 206 mg/dL — ABNORMAL HIGH (ref 70–99)

## 2021-11-23 LAB — CBC
HCT: 23.8 % — ABNORMAL LOW (ref 39.0–52.0)
Hemoglobin: 7.7 g/dL — ABNORMAL LOW (ref 13.0–17.0)
MCH: 27.6 pg (ref 26.0–34.0)
MCHC: 32.4 g/dL (ref 30.0–36.0)
MCV: 85.3 fL (ref 80.0–100.0)
Platelets: 266 10*3/uL (ref 150–400)
RBC: 2.79 MIL/uL — ABNORMAL LOW (ref 4.22–5.81)
RDW: 17.2 % — ABNORMAL HIGH (ref 11.5–15.5)
WBC: 6.7 10*3/uL (ref 4.0–10.5)
nRBC: 0 % (ref 0.0–0.2)

## 2021-11-23 LAB — BASIC METABOLIC PANEL
Anion gap: 11 (ref 5–15)
BUN: 89 mg/dL — ABNORMAL HIGH (ref 8–23)
CO2: 27 mmol/L (ref 22–32)
Calcium: 8.5 mg/dL — ABNORMAL LOW (ref 8.9–10.3)
Chloride: 96 mmol/L — ABNORMAL LOW (ref 98–111)
Creatinine, Ser: 2.22 mg/dL — ABNORMAL HIGH (ref 0.61–1.24)
GFR, Estimated: 30 mL/min — ABNORMAL LOW (ref >60)
Glucose, Bld: 183 mg/dL — ABNORMAL HIGH (ref 70–99)
Potassium: 4.9 mmol/L (ref 3.5–5.1)
Sodium: 134 mmol/L — ABNORMAL LOW (ref 135–145)

## 2021-11-23 LAB — OCCULT BLOOD X 1 CARD TO LAB, STOOL: Fecal Occult Bld: NEGATIVE

## 2021-11-23 MED ORDER — CEFAZOLIN SODIUM-DEXTROSE 2-4 GM/100ML-% IV SOLN
2.0000 g | Freq: Two times a day (BID) | INTRAVENOUS | Status: DC
Start: 2021-11-23 — End: 2021-12-05
  Administered 2021-11-23 – 2021-12-04 (×23): 2 g via INTRAVENOUS
  Filled 2021-11-23 (×24): qty 100

## 2021-11-23 NOTE — Progress Notes (Addendum)
PROGRESS NOTE  Glenn [name]  [id number] DOB: 09-20-1940 DOA: 10/09/2021 PCP: System, Provider Not In   Brief Narrative:  Patient is a 81 y.o. 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  Patient does intermittent catheterization at home whenever he feels like doing usually every 48 hours.  On presentation he was hyportensive.  Lab work showed leukocytosis.  Creatinine was in the range of 3.6.  CT imaging showed distended bladder, discitis/osteomyelitis of the spine.  He was transferred to [facility], ICU for pressures.  Foley was placed for urinary retention.  MRI did not show any drainable focus, pressure started off, transferred out of ICU.  Underwent disc aspiration on 6/8 by IR, cultures showed Klebsiella.  ID was consulted and recommended antibiotics.  PT/OT recommended CIR on discharge.  Because of severe volume overload, IV Lasix was started.  Cardiology also consulted  for resistant anasarca. Ultimate plan is to discharge to CIR .  Prolonged hospitalization due to anasarca not responding to diuresis.  Assessment & Plan:  Principal Problem:   Septic shock ([place]) Active Problems:   Bacteremia due to Klebsiella pneumoniae   Discitis   Osteomyelitis of lumbar spine (HCC)   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HCC)   Bladder outlet obstruction   Acute kidney injury ([place])   Uncontrolled type 2 diabetes mellitus with hyperglycemia, with long-term current use of insulin (HCC)   Pacemaker   Chronic kidney disease, stage 3b (HCC)   Coronary artery disease involving native coronary artery of native heart without angina pectoris   Chronic combined systolic and diastolic CHF (congestive heart failure) (HCC)   Hyponatremia   Hypokalemia   Transaminitis   Myocardial injury   Thrombocytopenia (HCC)   Anemia due to chronic kidney disease   Coagulopathy (HCC)   Protein-calorie malnutrition, moderate  (HCC)   Paroxysmal atrial fibrillation ([place])   Foot ulcer ([place])  Septic shock/Klebsiella bacteremia/UTI: Presented to [facility] initially with back pain, weakness.  Found to be very hypotensive.  Septic shock suspected.  Started on broad-spectrum antibiotics.  Sent to ICU at [facility] with pressor support.  Currently hemodynamically stable Found to have osteomyelitis/discitis of lumbar spine, psoas abscess.  Underwent disc space drainage by IR.  Culture resulted Klebsiella.  ID was consulted.  PICC line placed.  Recommended cefazolin till 7/18.  Sepsis physiology has resolved.  Leukocytosis resolved.  Chronic combined systolic/diastolic congestive heart failure/volume overload: Known history of systolic congestive heart failure.  EF is less than 20%, has grade 2 diastolic dysfunction.  Status post ICD placement.  Looks severely volume overloaded  along with bilateral lower extremity edema, scrotal edema.Started on  lasix 80 mg IV, Hypoalbuminemia is also contributing.  Was also  on albumin twice daily along with Lasix. Since kidney function declined today, we are holding Lasix.  monitor input/output, daily weight.  Takes torsemide at home.  He follows with cardiology.  Also takes Entresto at home which is on hold.  Cardiology also following. Will order compression stockings for his bilateral lower extremity edema.  AKI on CKD stage IIIb: Baseline creatinine arounfd 1.8.  Presented with creatinine in the range of 3.6.  Kidney function was improving but trended up today in the range of 2 so Lasix on hold.  Check BMP tomorrow   Coronary artery disease: No anginal symptoms.  On Lipitor, beta-blocker at home now on hold  Paroxysmal A-fib:  Currently rate is controlled.  On Eliquis for anticoagulation.  Monitor on telemetry.  Was on Coreg for rate control, currently on hold due to hypotension  Bladder outlet obstruction: On presentation, imaging showed distended bladder.  Takes finasteride, Flomax at home.  Does  self cath at home.  Foley was placed here.  Continue Foley until IV Lasix discontinued.  Then will check voiding trial.  Type 2 diabetes/hyperglycemia: Last hemoglobin A1c of more than 10.  Continue current insulin regimen.  Monitor blood sugars  Normocytic anemia:   Hemoglobin was in the range of 11 few weeks ago.Now trended down to 7-8.  No evidence of acute blood loss.  He is on Eliquis.  We will check FOBT.  Denies any hematochezia or melena.  Moderate malnutrition: Nutritionist following  Deconditioning/debility: PT/OT recommended CIR on discharge.    Nutrition Problem: Moderate Malnutrition Etiology: chronic illness (CHF) Pressure Injury 11/02/21 Foot Left;Lateral Deep Tissue Pressure Injury - Purple or maroon localized area of discolored intact skin or blood-filled blister due to damage of underlying soft tissue from pressure and/or shear. 5.5x4 (Active)  11/02/21   Location: Foot  Location Orientation: Left;Lateral  Staging: Deep Tissue Pressure Injury - Purple or maroon localized area of discolored intact skin or blood-filled blister due to damage of underlying soft tissue from pressure and/or shear.  Wound Description (Comments): 5.5x4  Present on Admission: Yes  Dressing Type Gauze (Comment) 11/22/21 0731    DVT prophylaxis:Place TED hose Start: 11/23/21 1011 Place and maintain sequential compression device Start: 11/02/21 1108 apixaban (ELIQUIS) tablet 5 mg     Code Status: Full Code  Family Communication: Called and discussed with daughter on phone on 6/20  Patient status:Inpatient  Patient is from :Home  Anticipated discharge to:CIR  Estimated DC date: After improvement in the volume overload   Consultants: PCCM, IR, ID  Procedures: Dx aspiration  Antimicrobials:  Anti-infectives (From admission, onward)    Start     Dose/Rate Route Frequency Ordered Stop   11/23/21 2200  ceFAZolin (ANCEF) IVPB 2g/100 mL premix        2 g 200 mL/hr over 30 Minutes  Intravenous Every 12 hours 11/23/21 0918     11/22/21 1400  ceFAZolin (ANCEF) IVPB 2g/100 mL premix  Status:  Discontinued        2 g 200 mL/hr over 30 Minutes Intravenous Every 8 hours 11/22/21 0828 11/23/21 0918   11/20/21 2000  ceFAZolin (ANCEF) IVPB 2g/100 mL premix  Status:  Discontinued        2 g 200 mL/hr over 30 Minutes Intravenous Every 12 hours 11/20/21 1230 11/22/21 0828   11/20/21 1600  ceFAZolin (ANCEF) IVPB 2g/100 mL premix  Status:  Discontinued        2 g 200 mL/hr over 30 Minutes Intravenous Every 8 hours 11/20/21 1209 11/20/21 1230   11/12/21 1800  ceFAZolin (ANCEF) IVPB 2g/100 mL premix  Status:  Discontinued        2 g 200 mL/hr over 30 Minutes Intravenous Every 12 hours 11/12/21 0936 11/20/21 1209   11/04/21 1400  ceFAZolin (ANCEF) IVPB 2g/100 mL premix  Status:  Discontinued        2 g 200 mL/hr over 30 Minutes Intravenous Every 8 hours 11/04/21 1058 11/12/21 0936   11/03/21 1800  vancomycin (VANCOCIN) IVPB 1000 mg/200 mL premix  Status:  Discontinued        1,000 mg 200 mL/hr over 60 Minutes Intravenous Every 48 hours 11/02/21 0259 11/02/21 0452   11/02/21  2200  cefTRIAXone (ROCEPHIN) 2 g in sodium chloride 0.9 % 100 mL IVPB  Status:  Discontinued        2 g 200 mL/hr over 30 Minutes Intravenous Every 24 hours 11/02/21 0452 11/04/21 1058   11/02/21 0230  ceFEPIme (MAXIPIME) 2 g in sodium chloride 0.9 % 100 mL IVPB  Status:  Discontinued        2 g 200 mL/hr over 30 Minutes Intravenous Every 24 hours 11/02/21 0131 11/02/21 0452       Subjective:  Patient seen and examined at the bedside this morning.  Lasix had to be stopped due to worsening creatinine.  He still has significant bilateral lower extremity edema.There is  improvement in the pain on his scrotal edema.  Denies any shortness of breath or cough.  Objective: Vitals:   11/22/21 2105 11/23/21 0500 11/23/21 0534 11/23/21 0900  BP: 102/68  106/66 116/71  Pulse: 91  89 85  Resp: 18  18 18   Temp: 98.6  F (37 C)  98.2 F (36.8 C) 98.4 F (36.9 C)  TempSrc:   Oral Oral  SpO2: 99%  97% 98%  Weight:  72.5 kg 73.5 kg   Height:        Intake/Output Summary (Last 24 hours) at 11/23/2021 1127 Last data filed at 11/23/2021 0831 Gross per 24 hour  Intake 1204.89 ml  Output 1850 ml  Net -645.11 ml   Filed Weights   11/22/21 0538 11/23/21 0500 11/23/21 0534  Weight: 70.4 kg 72.5 kg 73.5 kg    Examination:   General exam: Overall comfortable, not in distress, very deconditioned, chronically ill looking HEENT: PERRL Respiratory system: Mild bibasilar crackles, no wheezing Cardiovascular system: S1 & S2 heard, RRR.  Gastrointestinal system: Abdomen is nondistended, soft and nontender. Central nervous system: Alert and oriented Extremities: Bilateral lower extremity pitting edema, no clubbing ,no cyanosis Skin: No rashes, no ulcers,no icterus   GU: Foley, penile, scrotal swelling   Data Reviewed: I have personally reviewed following labs and imaging studies  CBC: Recent Labs  Lab 11/23/21 0305  WBC 6.7  HGB 7.7*  HCT 23.8*  MCV 85.3  PLT 266   Basic Metabolic Panel: Recent Labs  Lab 11/19/21 0315 11/20/21 0424 11/21/21 0355 11/22/21 0359 11/23/21 0305  NA 136 133* 137 134* 134*  K 4.3 3.8 3.8 4.0 4.9  CL 98 96* 98 95* 96*  CO2 28 27 28 28 27   GLUCOSE 126* 175* 139* 126* 183*  BUN 81* 76* 76* 74* 89*  CREATININE 1.95* 1.89* 1.78* 1.69* 2.22*  CALCIUM 8.3* 8.3* 8.5* 8.4* 8.5*     No results found for this or any previous visit (from the past 240 hour(s)).    Radiology Studies: No results found.  Scheduled Meds:  (feeding supplement) PROSource Plus  30 mL Oral TID BM   apixaban  5 mg Oral BID   artificial tears   Both Eyes QHS   vitamin C  500 mg Oral BID   atorvastatin  80 mg Oral QHS   chlorhexidine  15 mL Mouth Rinse BID   Chlorhexidine Gluconate Cloth  6 each Topical Q0600   finasteride  5 mg Oral Daily   insulin aspart  0-15 Units Subcutaneous TID  WC   insulin aspart  0-5 Units Subcutaneous QHS   insulin detemir  15 Units Subcutaneous Daily   ketotifen  1 drop Both Eyes BID   latanoprost  1 drop Both Eyes QHS   mouth rinse  15  mL Mouth Rinse q12n4p   multivitamin with minerals  1 tablet Oral Daily   pantoprazole  40 mg Oral Q1200   polyethylene glycol  17 g Oral BID   sodium chloride flush  10-40 mL Intracatheter Q12H   sodium chloride flush  10-40 mL Intracatheter Q12H   tamsulosin  0.8 mg Oral QHS   zinc sulfate  220 mg Oral Daily   Continuous Infusions:  sodium chloride 250 mL (11/02/21 0205)   sodium chloride 10 mL/hr at 11/19/21 1700    ceFAZolin (ANCEF) IV       LOS: 22 days   Burnadette Pop, MD [facility] P6/22/2023, 11:27 AM

## 2021-11-23 NOTE — Progress Notes (Signed)
PHARMACY NOTE:  ANTIMICROBIAL RENAL DOSAGE ADJUSTMENT  Current antimicrobial regimen includes a mismatch between antimicrobial dosage and estimated renal function.  As per policy approved by the Pharmacy & Therapeutics and Medical Executive Committees, the antimicrobial dosage will be adjusted accordingly.  Current antimicrobial dosage:  Cefazolin 2gm IV q8h  Indication: OM/discitis  Renal Function:  Estimated Creatinine Clearance: 28.3 mL/min (A) (by C-G formula based on SCr of 2.22 mg/dL (H)). []      On intermittent HD, scheduled: []      On CRRT    Antimicrobial dosage has been changed to:  Cefazolin 2gm IV q12h  Additional comments: OPAT updated   Glenn Defino A. , PharmD, BCPS, FNKF Clinical Pharmacist [place] Please utilize Amion for appropriate phone number to reach the unit pharmacist [facility])  11/23/2021 9:17 AM

## 2021-11-23 NOTE — TOC Progression Note (Signed)
Transition of Care [facility]) - Initial/Assessment Note    Patient Details  Name: Glenn [name] MRN: [id number] Date of Birth: 01-Dec-1940  Transition of [facility]) CM/SW Contact:    Ralene Bathe, LCSWA Phone Number: 11/23/2021, 3:45 PM  Clinical Narrative:                 Patient can admit to CIR once medically stable.  TOC will continue to follow.  Expected Discharge Plan: Home/Self Care Barriers to Discharge: Continued Medical Work up   Patient Goals and CMS Choice Patient states their goals for this hospitalization and ongoing recovery are:: Wants to get better to go home CMS Medicare.gov Compare Post Acute Care list provided to::  (n/a) Choice offered to / list presented to : NA  Expected Discharge Plan and Services Expected Discharge Plan: Home/Self Care In-house Referral: NA Discharge Planning Services: CM Consult Post Acute Care Choice: NA Living arrangements for the past 2 months: Single Family Home                 DME Arranged: N/A DME Agency: NA       HH Arranged: NA HH Agency: NA        Prior Living Arrangements/Services Living arrangements for the past 2 months: Single Family Home Lives with:: Spouse, Adult Children Patient language and need for interpreter reviewed:: Yes Do you feel safe going back to the place where you live?: Yes      Need for Family Participation in Patient Care: Yes (Comment) Care giver support system in place?: Yes (comment) Current home services: DME (wheelchair cane walker per patient) Criminal Activity/Legal Involvement Pertinent to Current Situation/Hospitalization: No - Comment as needed  Activities of Daily Living      Permission Sought/Granted Permission sought to share information with : Family Supports Permission granted to share information with : No              Emotional Assessment Appearance:: Appears stated age Attitude/Demeanor/Rapport: Gracious, Engaged Affect (typically observed): Accepting,  Pleasant Orientation: : Oriented to Self, Oriented to Place, Oriented to  Time, Oriented to Situation Alcohol / Substance Use: Not Applicable Psych Involvement: No (comment)  Admission diagnosis:  Septic shock (HCC) [A41.9, R65.21] Patient Active Problem List   Diagnosis Date Noted   Foot ulcer (HCC) 11/06/2021   Discitis 11/05/2021   Osteomyelitis of lumbar spine (HCC) 11/05/2021   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HCC) 11/05/2021   Chronic kidney disease, stage 3b (HCC) 11/05/2021   Coronary artery disease involving native coronary artery of native heart without angina pectoris 11/05/2021   Chronic combined systolic and diastolic CHF (congestive heart failure) (HCC) 11/05/2021   Hyponatremia 11/05/2021   Hypokalemia 11/05/2021   Transaminitis 11/05/2021   Myocardial injury 11/05/2021   Thrombocytopenia (HCC) 11/05/2021   Anemia due to chronic kidney disease 11/05/2021   Coagulopathy (HCC) 11/05/2021   Protein-calorie malnutrition, moderate (HCC) 11/05/2021   Paroxysmal atrial fibrillation (HCC) 11/05/2021   Septic shock (HCC) 11/02/2021   Bacteremia due to Klebsiella pneumoniae 11/02/2021   Acute kidney injury (HCC) 11/02/2021   Bladder outlet obstruction 11/02/2021   Uncontrolled type 2 diabetes mellitus with hyperglycemia, with long-term current use of insulin (HCC) 11/02/2021   Pacemaker 11/02/2021   PCP:  System, Provider Not In Pharmacy:   [facility] [address] [place] [address] Phone: [phone] Fax: [phone]  [facility] [place], [place] - [address] [place] [address] Phone: [phone] Fax: [phone]  Social Determinants of Health (SDOH) Interventions    Readmission Risk Interventions    11/06/2021   12:28 PM  Readmission Risk Prevention Plan  Transportation Screening Complete  PCP or Specialist Appt within 3-5 Days Complete  HRI or Home Care Consult Complete  Social Work Consult for Recovery  Care Planning/Counseling Complete  Palliative Care Screening Not Applicable  Medication Review [facility]) Referral to Pharmacy

## 2021-11-23 NOTE — Progress Notes (Signed)
Physical Therapy Treatment Patient Details Name: Glenn [name] MRN: [id number] DOB: 1940-09-25 Today's Date: 11/23/2021   History of Present Illness Patient is a 81 y/o male who presents on 5/31 with generalized weakness and falls. 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 PMH includes DM, HTN, MI, CAD s/p CABG, recent PPM placement and DCCV for Afib, CHF, CKD, BPH. L2-L3 disc aspiration planned for 11/09/21.    PT Comments    Pt supine in bed on entry. Reports he is willing to get up and reports "I'll do all I can do." Pt continues to be limited in safe mobility by generalized weakness and associated decreased balance and endurance. Pt is currently min guard for bed mobility, modA for transfers, and min A for ambulation. With return to room pt performs seated exercises. D/c plans remain appropriate. PT will continue to follow acutely.   Recommendations for follow up therapy are one component of a multi-disciplinary discharge planning process, led by the attending physician.  Recommendations may be updated based on patient status, additional functional criteria and insurance authorization.  Follow Up Recommendations  Acute inpatient rehab (3hours/day)     Assistance Recommended at Discharge Frequent or constant Supervision/Assistance  Patient can return home with the following A little help with walking and/or transfers;A little help with bathing/dressing/bathroom;Assistance with cooking/housework;Assist for transportation;Help with stairs or ramp for entrance   Equipment Recommendations  None recommended by PT       Precautions / Restrictions Precautions Precautions: None Restrictions Weight Bearing Restrictions: No     Mobility  Bed Mobility Overal bed mobility: Needs Assistance Bed Mobility: Supine to Sit     Supine to sit: Min guard     General bed mobility  comments: moving slowly, but did not need physical assist    Transfers Overall transfer level: Needs assistance Equipment used: Rolling walker (2 wheels) Transfers: Sit to/from Stand Sit to Stand: From elevated surface, Mod assist (bed elevated to simulate home)           General transfer comment: pt initially not able to power up to standing, reassessed positioning and provided vc for scooting further forward for more effective power up and pt able to achieve with modA for power up    Ambulation/Gait Ambulation/Gait assistance: Min assist Gait Distance (Feet): 80 Feet Assistive device: Rolling walker (2 wheels) Gait Pattern/deviations: Step-to pattern, Shuffle, Decreased stride length, Trunk flexed Gait velocity: decreased Gait velocity interpretation: <1.31 ft/sec, indicative of household ambulator   General Gait Details: light min A for steadying with slowed shuffling gait, vc for upright posture and proximity to RW.      Balance Overall balance assessment: Needs assistance Sitting-balance support: No upper extremity supported, Feet supported Sitting balance-Leahy Scale: Fair Sitting balance - Comments: required min guard for safety   Standing balance support: Reliant on assistive device for balance, Bilateral upper extremity supported Standing balance-Leahy Scale: Poor Standing balance comment: reliant on RW for support                            Cognition Arousal/Alertness: Awake/alert Behavior During Therapy: WFL for tasks assessed/performed Overall Cognitive Status: Within Functional Limits for tasks assessed                                 General Comments: "all I  can do is try"        Exercises General Exercises - Lower Extremity Gluteal Sets: AROM, Both, 10 reps, Seated Hip ABduction/ADduction: AROM, Both, 10 reps, Seated Hip Flexion/Marching: AROM, Both, 10 reps, Seated Toe Raises: AROM, Both, 10 reps, Seated Heel Raises:  AROM, Both, 10 reps, Seated    General Comments  VSS on RA      Pertinent Vitals/Pain Pain Assessment Pain Assessment: Faces Faces Pain Scale: Hurts a little bit Breathing: normal Negative Vocalization: none Facial Expression: smiling or inexpressive Body Language: relaxed Consolability: no need to console PAINAD Score: 0 Pain Descriptors / Indicators: Discomfort, Grimacing Pain Intervention(s): Limited activity within patient's tolerance, Monitored during session, Repositioned     PT Goals (current goals can now be found in the care plan section) Acute Rehab PT Goals Patient Stated Goal: to go home PT Goal Formulation: With patient Time For Goal Achievement: 12/05/21 Potential to Achieve Goals: Good Progress towards PT goals: Progressing toward goals    Frequency    Min 3X/week      PT Plan Current plan remains appropriate       AM-PAC PT "6 Clicks" Mobility   Outcome Measure  Help needed turning from your back to your side while in a flat bed without using bedrails?: A Little Help needed moving from lying on your back to sitting on the side of a flat bed without using bedrails?: A Little Help needed moving to and from a bed to a chair (including a wheelchair)?: A Little Help needed standing up from a chair using your arms (e.g., wheelchair or bedside chair)?: A Little Help needed to walk in hospital room?: A Little Help needed climbing 3-5 steps with a railing? : Total 6 Click Score: 16    End of Session Equipment Utilized During Treatment: Gait belt Activity Tolerance: Patient tolerated treatment well Patient left: in chair;with call bell/phone within reach;with chair alarm set Nurse Communication: Mobility status PT Visit Diagnosis: Unsteadiness on feet (R26.81);Other abnormalities of gait and mobility (R26.89);Muscle weakness (generalized) (M62.81);Pain Pain - part of body:  (generalized)     Time: [id number] PT Time Calculation (min) (ACUTE ONLY): 27  min  Charges:  $Gait Training: 8-22 mins $Therapeutic Exercise: 8-22 mins                     Olevia Westervelt B. Beverely Risen PT, DPT Acute Rehabilitation Services Please use secure chat or  Call Office [phone]    Elon Alas [facility] 11/23/2021, 3:10 PM

## 2021-11-23 NOTE — Plan of Care (Signed)
  Problem: Nutritional: Goal: Maintenance of adequate nutrition will improve Outcome: Progressing   Problem: Skin Integrity: Goal: Risk for impaired skin integrity will decrease Outcome: Progressing   Problem: Activity: Goal: Risk for activity intolerance will decrease Outcome: Progressing   Problem: Elimination: Goal: Will not experience complications related to bowel motility Outcome: Progressing

## 2021-11-23 NOTE — Progress Notes (Signed)
Ordered medium compression socks for patient based on chart in sidebar.

## 2021-11-24 ENCOUNTER — Encounter (HOSPITAL_COMMUNITY): Admission: AD | Disposition: E | Payer: Self-pay | Source: Other Acute Inpatient Hospital | Attending: Internal Medicine

## 2021-11-24 DIAGNOSIS — I5023 Acute on chronic systolic (congestive) heart failure: Secondary | ICD-10-CM

## 2021-11-24 DIAGNOSIS — I5033 Acute on chronic diastolic (congestive) heart failure: Secondary | ICD-10-CM

## 2021-11-24 DIAGNOSIS — I2510 Atherosclerotic heart disease of native coronary artery without angina pectoris: Secondary | ICD-10-CM | POA: Diagnosis not present

## 2021-11-24 DIAGNOSIS — N179 Acute kidney failure, unspecified: Secondary | ICD-10-CM | POA: Diagnosis not present

## 2021-11-24 DIAGNOSIS — N1832 Chronic kidney disease, stage 3b: Secondary | ICD-10-CM | POA: Diagnosis not present

## 2021-11-24 DIAGNOSIS — E876 Hypokalemia: Secondary | ICD-10-CM | POA: Diagnosis not present

## 2021-11-24 HISTORY — PX: RIGHT HEART CATH: CATH118263

## 2021-11-24 LAB — GLUCOSE, CAPILLARY
Glucose-Capillary: 156 mg/dL — ABNORMAL HIGH (ref 70–99)
Glucose-Capillary: 202 mg/dL — ABNORMAL HIGH (ref 70–99)
Glucose-Capillary: 126 mg/dL — ABNORMAL HIGH (ref 70–99)
Glucose-Capillary: 117 mg/dL — ABNORMAL HIGH (ref 70–99)

## 2021-11-24 LAB — CBC
HCT: 24.5 % — ABNORMAL LOW (ref 39.0–52.0)
Hemoglobin: 7.7 g/dL — ABNORMAL LOW (ref 13.0–17.0)
MCH: 26.9 pg (ref 26.0–34.0)
MCHC: 31.4 g/dL (ref 30.0–36.0)
MCV: 85.7 fL (ref 80.0–100.0)
Platelets: 284 10*3/uL (ref 150–400)
RBC: 2.86 MIL/uL — ABNORMAL LOW (ref 4.22–5.81)
RDW: 17.1 % — ABNORMAL HIGH (ref 11.5–15.5)
WBC: 6.2 10*3/uL (ref 4.0–10.5)
nRBC: 0 % (ref 0.0–0.2)

## 2021-11-24 LAB — BASIC METABOLIC PANEL
Anion gap: 12 (ref 5–15)
BUN: 103 mg/dL — ABNORMAL HIGH (ref 8–23)
CO2: 27 mmol/L (ref 22–32)
Calcium: 8.5 mg/dL — ABNORMAL LOW (ref 8.9–10.3)
Chloride: 95 mmol/L — ABNORMAL LOW (ref 98–111)
Creatinine, Ser: 2.53 mg/dL — ABNORMAL HIGH (ref 0.61–1.24)
GFR, Estimated: 25 mL/min — ABNORMAL LOW (ref >60)
Glucose, Bld: 158 mg/dL — ABNORMAL HIGH (ref 70–99)
Potassium: 4.5 mmol/L (ref 3.5–5.1)
Sodium: 134 mmol/L — ABNORMAL LOW (ref 135–145)

## 2021-11-24 SURGERY — RIGHT HEART CATH

## 2021-11-24 MED ORDER — SODIUM CHLORIDE 0.9% FLUSH
3.0000 mL | Freq: Two times a day (BID) | INTRAVENOUS | Status: DC
  Administered 2021-11-24 – 2021-11-25 (×2): 3 mL via INTRAVENOUS

## 2021-11-24 MED ORDER — HEPARIN (PORCINE) IN NACL 1000-0.9 UT/500ML-% IV SOLN
INTRAVENOUS | Status: DC | PRN
  Administered 2021-11-24: 500 mL

## 2021-11-24 MED ORDER — LABETALOL HCL 5 MG/ML IV SOLN: 10.0000 mg | INTRAVENOUS | Status: AC | PRN

## 2021-11-24 MED ORDER — APIXABAN 5 MG PO TABS: 5.0000 mg | ORAL_TABLET | Freq: Two times a day (BID) | ORAL | Status: DC

## 2021-11-24 MED ORDER — HEPARIN (PORCINE) IN NACL 1000-0.9 UT/500ML-% IV SOLN
INTRAVENOUS | Status: AC
  Filled 2021-11-24: qty 500

## 2021-11-24 MED ORDER — HYDRALAZINE HCL 20 MG/ML IJ SOLN: 10.0000 mg | INTRAMUSCULAR | Status: AC | PRN

## 2021-11-24 MED ORDER — SODIUM CHLORIDE 0.9% FLUSH: 3.0000 mL | INTRAVENOUS | Status: DC | PRN

## 2021-11-24 MED ORDER — ONDANSETRON HCL 4 MG/2ML IJ SOLN: 4.0000 mg | Freq: Four times a day (QID) | INTRAMUSCULAR | Status: DC | PRN

## 2021-11-24 MED ORDER — LIDOCAINE HCL (PF) 1 % IJ SOLN
INTRAMUSCULAR | Status: DC | PRN
  Administered 2021-11-24: 15 mL via INTRADERMAL
  Administered 2021-11-24: 2 mL via INTRADERMAL

## 2021-11-24 MED ORDER — LIDOCAINE HCL (PF) 1 % IJ SOLN
INTRAMUSCULAR | Status: AC
  Filled 2021-11-24: qty 30

## 2021-11-24 MED ORDER — SODIUM CHLORIDE 0.9 % IV SOLN: 250.0000 mL | INTRAVENOUS | Status: DC | PRN

## 2021-11-24 MED ORDER — ACETAMINOPHEN 325 MG PO TABS
650.0000 mg | ORAL_TABLET | ORAL | Status: DC | PRN
  Administered 2021-11-26: 650 mg via ORAL
  Filled 2021-11-24 (×2): qty 2

## 2021-11-24 MED ORDER — SODIUM CHLORIDE 0.9 % IV SOLN
250.0000 mL | INTRAVENOUS | Status: DC | PRN
Start: 2021-11-24 — End: 2021-11-26

## 2021-11-24 SURGICAL SUPPLY — 9 items
CATH BALLN WEDGE 5F 110CM (CATHETERS) ×1 IMPLANT
CATH SWAN GANZ 7F STRAIGHT (CATHETERS) ×1 IMPLANT
GUIDEWIRE .025 260CM (WIRE) ×1 IMPLANT
PACK CARDIAC CATHETERIZATION (CUSTOM PROCEDURE TRAY) ×2 IMPLANT
SHEATH GLIDE SLENDER 4/5FR (SHEATH) ×1 IMPLANT
SHEATH PINNACLE 7F 10CM (SHEATH) ×1 IMPLANT
SHEATH PROBE COVER 6X72 (BAG) ×1 IMPLANT
TRANSDUCER W/STOPCOCK (MISCELLANEOUS) ×2 IMPLANT
WIRE MICRO SET SILHO 5FR 7 (SHEATH) ×1 IMPLANT

## 2021-11-24 NOTE — Progress Notes (Signed)
Occupational Therapy Treatment Patient Details Name: Glenn [name] MRN: [id number] DOB: [birth year] Today's Date: 11/24/2021   History of present illness Patient is a 81 y/o male who presents on 5/31 with generalized weakness and falls. 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 PMH includes DM, HTN, MI, CAD s/p CABG, recent PPM placement and DCCV for Afib, CHF, CKD, BPH. L2-L3 disc aspiration planned for 11/09/21.   OT comments  Pt is currently still mod assist for sit to stand with LB selfcare and for transfers with use of the RW off of an elevated surface.  Bowel incontinence also noted with max assist for toilet hygiene.  Pt continues to be motivated to do as much as he can to get back to being independent.  Recommend continued OT to progress ADL independence with hopeful transition to AIR for greater intensity of therapy prior to home.     Recommendations for follow up therapy are one component of a multi-disciplinary discharge planning process, led by the attending physician.  Recommendations may be updated based on patient status, additional functional criteria and insurance authorization.    Follow Up Recommendations  Acute inpatient rehab (3hours/day)    Assistance Recommended at Discharge Frequent or constant Supervision/Assistance  Patient can return home with the following  A little help with walking and/or transfers;A little help with bathing/dressing/bathroom;Assistance with cooking/housework;Direct supervision/assist for medications management;Direct supervision/assist for financial management;Assist for transportation   Equipment Recommendations  Other (comment) (TBD next venue of care)       Precautions / Restrictions Precautions Precautions: Fall Restrictions Weight Bearing Restrictions: No       Mobility Bed Mobility Overal bed mobility: Needs  Assistance Bed Mobility: Supine to Sit, Sit to Supine     Supine to sit: Min guard Sit to supine: Min guard        Transfers Overall transfer level: Needs assistance Equipment used: Rolling walker (2 wheels) Transfers: Sit to/from Stand Sit to Stand: From elevated surface, Mod assist     Step pivot transfers: Mod assist, From elevated surface     General transfer comment: Pt needing mod assist for sit to stand with decreased ability to extend hips along with knee extension.  instead he extends his knees with increased weight posteriorly with delay noted on hip extension.     Balance Overall balance assessment: Needs assistance Sitting-balance support: Bilateral upper extremity supported Sitting balance-Leahy Scale: Fair     Standing balance support: Reliant on assistive device for balance, Bilateral upper extremity supported Standing balance-Leahy Scale: Poor Standing balance comment: Pt needs BUE support as well as some therapist assist.                           ADL either performed or assessed with clinical judgement   ADL Overall ADL's : Needs assistance/impaired                     Lower Body Dressing: Minimal assistance;Sit to/from stand       Toileting- Architect and Hygiene: Maximal assistance;Sit to/from stand       Functional mobility during ADLs: Moderate assistance (to take a few steps EOB.) General ADL Comments: Pt incontinent of bowel with mod assist for sit to stand and standing while therapist assisted with cleaning peri area.  He performed several sit to stands from the elevated bed with decreased hip and  knee extension timing noted.               Cognition Arousal/Alertness: Awake/alert Behavior During Therapy: WFL for tasks assessed/performed Overall Cognitive Status: Within Functional Limits for tasks assessed                                                     Pertinent Vitals/  Pain       Pain Assessment Pain Assessment: Faces Faces Pain Scale: Hurts a little bit Pain Descriptors / Indicators: Discomfort, Grimacing Pain Intervention(s): Monitored during session, Repositioned         Frequency  Min 2X/week        Progress Toward Goals  OT Goals(current goals can now be found in the care plan section)  Progress towards OT goals: Progressing toward goals  Acute Rehab OT Goals Patient Stated Goal: "I'll do what I can to get stronger." OT Goal Formulation: With patient Time For Goal Achievement: 12/05/21 Potential to Achieve Goals: Good  Plan Discharge plan remains appropriate       AM-PAC OT "6 Clicks" Daily Activity     Outcome Measure   Help from another person eating meals?: None Help from another person taking care of personal grooming?: A Little Help from another person toileting, which includes using toliet, bedpan, or urinal?: A Lot Help from another person bathing (including washing, rinsing, drying)?: A Lot Help from another person to put on and taking off regular upper body clothing?: A Little Help from another person to put on and taking off regular lower body clothing?: A Lot 6 Click Score: 16    End of Session Equipment Utilized During Treatment: Rolling walker (2 wheels)  OT Visit Diagnosis: Unsteadiness on feet (R26.81);Muscle weakness (generalized) (M62.81);Other symptoms and signs involving cognitive function;Pain Pain - part of body:  (neck/back)   Activity Tolerance Patient tolerated treatment well   Patient Left with call bell/phone within reach;in bed   Nurse Communication Mobility status        Time: 8295-6213 OT Time Calculation (min): 30 min  Charges: OT General Charges $OT Visit: 1 Visit OT Treatments $Self Care/Home Management : 23-37 mins  Markasia Carrol OTR/L 12/24/2021, 12:57 PM

## 2021-11-24 NOTE — Progress Notes (Signed)
Inpatient Rehab Admissions Coordinator:   Lasix on hold due to uptrending Creatinine.  Still volume overloaded.  Possible heart cath.  Will continue to follow for potential CIR admission once cleared medically.   Estill Dooms, PT, DPT Admissions Coordinator [phone] 11/24/21  11:23 AM

## 2021-11-25 ENCOUNTER — Encounter (HOSPITAL_COMMUNITY): Payer: Self-pay | Admitting: Internal Medicine

## 2021-11-25 DIAGNOSIS — Z950 Presence of cardiac pacemaker: Secondary | ICD-10-CM | POA: Diagnosis not present

## 2021-11-25 DIAGNOSIS — I5042 Chronic combined systolic (congestive) and diastolic (congestive) heart failure: Secondary | ICD-10-CM | POA: Diagnosis not present

## 2021-11-25 DIAGNOSIS — I480 Paroxysmal atrial fibrillation: Secondary | ICD-10-CM | POA: Diagnosis not present

## 2021-11-25 DIAGNOSIS — I2510 Atherosclerotic heart disease of native coronary artery without angina pectoris: Secondary | ICD-10-CM | POA: Diagnosis not present

## 2021-11-25 LAB — CBC
HCT: 24.4 % — ABNORMAL LOW (ref 39.0–52.0)
Hemoglobin: 8 g/dL — ABNORMAL LOW (ref 13.0–17.0)
MCH: 27.7 pg (ref 26.0–34.0)
MCHC: 32.8 g/dL (ref 30.0–36.0)
MCV: 84.4 fL (ref 80.0–100.0)
Platelets: 288 10*3/uL (ref 150–400)
RBC: 2.89 MIL/uL — ABNORMAL LOW (ref 4.22–5.81)
RDW: 17.2 % — ABNORMAL HIGH (ref 11.5–15.5)
WBC: 7.3 10*3/uL (ref 4.0–10.5)
nRBC: 0 % (ref 0.0–0.2)

## 2021-11-25 LAB — BASIC METABOLIC PANEL
Anion gap: 9 (ref 5–15)
BUN: 113 mg/dL — ABNORMAL HIGH (ref 8–23)
CO2: 25 mmol/L (ref 22–32)
Calcium: 8.3 mg/dL — ABNORMAL LOW (ref 8.9–10.3)
Chloride: 97 mmol/L — ABNORMAL LOW (ref 98–111)
Creatinine, Ser: 2.65 mg/dL — ABNORMAL HIGH (ref 0.61–1.24)
GFR, Estimated: 24 mL/min — ABNORMAL LOW (ref >60)
Glucose, Bld: 129 mg/dL — ABNORMAL HIGH (ref 70–99)
Potassium: 4.9 mmol/L (ref 3.5–5.1)
Sodium: 131 mmol/L — ABNORMAL LOW (ref 135–145)

## 2021-11-25 LAB — COOXEMETRY PANEL
Carboxyhemoglobin: 1.9 % — ABNORMAL HIGH (ref 0.5–1.5)
Methemoglobin: <0.7 % (ref 0.0–1.5)
O2 Saturation: 51.7 %
Total hemoglobin: 9.1 g/dL — ABNORMAL LOW (ref 12.0–16.0)

## 2021-11-25 LAB — GLUCOSE, CAPILLARY
Glucose-Capillary: 163 mg/dL — ABNORMAL HIGH (ref 70–99)
Glucose-Capillary: 243 mg/dL — ABNORMAL HIGH (ref 70–99)
Glucose-Capillary: 211 mg/dL — ABNORMAL HIGH (ref 70–99)
Glucose-Capillary: 137 mg/dL — ABNORMAL HIGH (ref 70–99)

## 2021-11-25 MED ORDER — DOBUTAMINE IN D5W 4-5 MG/ML-% IV SOLN
2.5000 ug/kg/min | INTRAVENOUS | Status: DC
  Administered 2021-11-25 – 2021-12-04 (×4): 2.5 ug/kg/min via INTRAVENOUS
  Filled 2021-11-25 (×4): qty 250

## 2021-11-25 NOTE — Progress Notes (Signed)
PROGRESS NOTE    Glenn [name]  GEX:[id number] DOB: [birth year] DOA: 2021-11-05 PCP: System, Provider Not In   Brief Narrative:  Patient is a 81 y.o. M with CAD s/p CABG 96, ICM and sCHF EF <20%, DM, CKD IIIb baseline 1.7, pAF on Eliquis, hx PPM and BPH who presented with 3 days weakness, back pain to [facility].  Patient does intermittent catheterization at home whenever he feels like doing usually every 48 hours.  On presentation he was hyportensive.  Lab work showed leukocytosis.  Creatinine was in the range of 3.6.  CT imaging showed distended bladder, discitis/osteomyelitis of the spine.  He was transferred to [facility], ICU for pressures.  Foley was placed for urinary retention.  MRI did not show any drainable focus, pressure started off, transferred out of ICU.  Underwent disc aspiration on 6/8 by IR, cultures showed Klebsiella.  ID was consulted and recommended antibiotics.  PT/OT recommended CIR on discharge.  Because of severe volume overload, IV Lasix was started.  Cardiology also consulted  for resistant anasarca. Ultimate plan is to discharge to CIR .  Prolonged hospitalization due to anasarca not responding to diuresis.  Cardiology planning for right heart cath to assess filling pressures.    Assessment & Plan:   Principal Problem:   Septic shock (HCC) Active Problems:   Bacteremia due to Klebsiella pneumoniae   Discitis   Osteomyelitis of lumbar spine (HCC)   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HCC)   Bladder outlet obstruction   Acute kidney injury (HCC)   Uncontrolled type 2 diabetes mellitus with hyperglycemia, with long-term current use of insulin (HCC)   Pacemaker   Chronic kidney disease, stage 3b (HCC)   Coronary artery disease involving native coronary artery of native heart without angina pectoris   Chronic combined systolic and diastolic CHF (congestive heart failure) (HCC)   Hyponatremia   Hypokalemia   Transaminitis   Myocardial injury   Thrombocytopenia (HCC)   Anemia due to chronic  kidney disease   Coagulopathy (HCC)   Protein-calorie malnutrition, moderate (HCC)   Paroxysmal atrial fibrillation (HCC)   Foot ulcer (HCC)  Septic shock/Klebsiella bacteremia/UTI, POA Osteomyelitis/discitis, POA Successful disc space drainage by IR.   Culture resulted Klebsiella.   ID was consulted. Recommended cefazolin till 7/18 via PICC line   Chronic combined systolic/diastolic congestive heart failure/volume overload:  Cardiology following -heart cath planned for later today Concern for cardiorenal syndrome at this point EF is less than 20%, has grade 2 diastolic dysfunction.  Status post ICD placement.   Not tolerating diuresis well, renal function declining On compression stockings for his bilateral lower extremity edema.   AKI on CKD stage IIIb:  Baseline creatinine arounfd 1.8.   Creatinine worsening -concerning for cardiorenal syndrome Lab Results  Component Value Date   CREATININE 2.65 (H) 11/25/2021   CREATININE 2.53 (H) 11/24/2021   CREATININE 2.22 (H) 11/23/2021    Coronary artery disease: No anginal symptoms.  On Lipitor, beta-blocker at home now on hold   Paroxysmal A-fib: Currently rate is controlled.  On Eliquis for anticoagulation.  Monitor on telemetry.  Was on Coreg for rate control, currently on hold due to hypotension   Bladder outlet obstruction: On presentation, imaging showed distended bladder.  Takes finasteride, Flomax at home.  Does self cath at home.  Foley was placed here.  Continue Foley until IV Lasix discontinued.  Then will check voiding trial.   Type 2 diabetes/hyperglycemia: Last hemoglobin A1c of more than 10.  Continue current insulin regimen.  Monitor  blood sugars   Normocytic anemia:   Hemoglobin was in the range of 11 few weeks ago.Now trended down to 7-8.  No evidence of acute blood loss.  He is on Eliquis.  Negative FOBT.  Denies any hematochezia or melena.  Continue to monitor   Moderate malnutrition: Nutritionist following    Deconditioning/debility: PT/OT recommended CIR on discharge.  DVT prophylaxis: Eliquis Code Status: Full Family Communication: None present  Status is: Inpatient  Dispo: The patient is from: Home              Anticipated d/c is to: CIR              Anticipated d/c date is: 48 to 72 hours              Patient currently not medically stable for discharge  Consultants:  Cardiology, heart failure  Procedures:  Cardiac cath 11/25/2021  Antimicrobials:  Cefazolin through 12/19/2021  Subjective: No acute issues or events overnight  Objective: Vitals:   11/24/21 1646 11/24/21 1730 11/24/21 2012 11/25/21 0626  BP: 121/75 110/84 114/73 110/67  Pulse: 85 90 88 93  Resp:   19   Temp:   98.7 F (37.1 C) 98.8 F (37.1 C)  TempSrc:   Oral Oral  SpO2:  100% 100% 96%  Weight:      Height:        Intake/Output Summary (Last 24 hours) at 11/25/2021 0818 Last data filed at 11/24/2021 0900 Gross per 24 hour  Intake 240 ml  Output --  Net 240 ml   Filed Weights   11/23/21 0500 11/23/21 0534 11/24/21 0633  Weight: 72.5 kg 73.5 kg 73.6 kg    Examination:  General:  Pleasantly resting in bed, No acute distress. HEENT:  Normocephalic atraumatic.  Sclerae nonicteric, noninjected.  Extraocular movements intact bilaterally. Neck:  Without mass or deformity.  Trachea is midline. Lungs:  Clear to auscultate bilaterally without rhonchi, wheeze, or rales. Heart:  Regular rate and rhythm.  Without murmurs, rubs, or gallops. Abdomen:  Soft, nontender, nondistended.  Without guarding or rebound. Extremities: Without cyanosis, clubbing, edema, or obvious deformity. Vascular:  Dorsalis pedis and posterior tibial pulses palpable bilaterally. Skin:  Warm and dry, no erythema, no ulcerations.  Data Reviewed: I have personally reviewed following labs and imaging studies  CBC: Recent Labs  Lab 11/23/21 0305 11/24/21 0342 11/25/21 0401  WBC 6.7 6.2 7.3  HGB 7.7* 7.7* 8.0*  HCT 23.8* 24.5*  24.4*  MCV 85.3 85.7 84.4  PLT 266 284 288   Basic Metabolic Panel: Recent Labs  Lab 11/21/21 0355 11/22/21 0359 11/23/21 0305 11/24/21 0342 11/25/21 0401  NA 137 134* 134* 134* 131*  K 3.8 4.0 4.9 4.5 4.9  CL 98 95* 96* 95* 97*  CO2 28 28 27 27 25   GLUCOSE 139* 126* 183* 158* 129*  BUN 76* 74* 89* 103* 113*  CREATININE 1.78* 1.69* 2.22* 2.53* 2.65*  CALCIUM 8.5* 8.4* 8.5* 8.5* 8.3*   GFR: Estimated Creatinine Clearance: 23.7 mL/min (A) (by C-G formula based on SCr of 2.65 mg/dL (H)). Liver Function Tests: Recent Labs  Lab 11/22/21 0359  AST 24  ALT 6  ALKPHOS 145*  BILITOT 0.4  PROT 6.7  ALBUMIN 2.7*   No results for input(s): "LIPASE", "AMYLASE" in the last 168 hours. No results for input(s): "AMMONIA" in the last 168 hours. Coagulation Profile: No results for input(s): "INR", "PROTIME" in the last 168 hours. Cardiac Enzymes: No results for input(s): "CKTOTAL", "CKMB", "  CKMBINDEX", "TROPONINI" in the last 168 hours. BNP (last 3 results) No results for input(s): "PROBNP" in the last 8760 hours. HbA1C: No results for input(s): "HGBA1C" in the last 72 hours. CBG: Recent Labs  Lab 2021/12/05 0803 Dec 05, 2021 1157 Dec 05, 2021 1646 12-05-2021 2137 11/25/21 0722  GLUCAP 156* 202* 126* 117* 163*   Lipid Profile: No results for input(s): "CHOL", "HDL", "LDLCALC", "TRIG", "CHOLHDL", "LDLDIRECT" in the last 72 hours. Thyroid Function Tests: No results for input(s): "TSH", "T4TOTAL", "FREET4", "T3FREE", "THYROIDAB" in the last 72 hours. Anemia Panel: No results for input(s): "VITAMINB12", "FOLATE", "FERRITIN", "TIBC", "IRON", "RETICCTPCT" in the last 72 hours. Sepsis Labs: No results for input(s): "PROCALCITON", "LATICACIDVEN" in the last 168 hours.  No results found for this or any previous visit (from the past 240 hour(s)).   Radiology Studies: CARDIAC CATHETERIZATION  Result Date: 2021-12-05 Findings: RA = 12 RV = 65/11 PA = 62/18 (35) PCW = 13 Fick cardiac  output/index = 5.4/2.8 Thermodilution CO/CI = 4.3/2.2 PVR = 4.5 WU FA sat = 97% PA sat = 49%, 54% PAPi = 3.7 Assessment: 1. Normal left sided filling pressures 2. 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 Mildly decreased cardiac output Plan/Discussion: He seems to have R>L HF. Hold diuretics for now. Co-ox on the low end. Can follow co-ox via PICC and adjust meds as needed Arvilla Meres, MD 2:19 PM   Scheduled Meds:  (feeding supplement) PROSource Plus  30 mL Oral TID BM   apixaban  5 mg Oral BID   artificial tears   Both Eyes QHS   vitamin C  500 mg Oral BID   atorvastatin  80 mg Oral QHS   chlorhexidine  15 mL Mouth Rinse BID   Chlorhexidine Gluconate Cloth  6 each Topical Q0600   finasteride  5 mg Oral Daily   insulin aspart  0-15 Units Subcutaneous TID WC   insulin aspart  0-5 Units Subcutaneous QHS   insulin detemir  15 Units Subcutaneous Daily   ketotifen  1 drop Both Eyes BID   latanoprost  1 drop Both Eyes QHS   mouth rinse  15 mL Mouth Rinse q12n4p   multivitamin with minerals  1 tablet Oral Daily   pantoprazole  40 mg Oral Q1200   polyethylene glycol  17 g Oral BID   sodium chloride flush  10-40 mL Intracatheter Q12H   sodium chloride flush  10-40 mL Intracatheter Q12H   sodium chloride flush  3 mL Intravenous Q12H   tamsulosin  0.8 mg Oral QHS   zinc sulfate  220 mg Oral Daily   Continuous Infusions:  sodium chloride 250 mL (11/02/21 0205)   sodium chloride 10 mL/hr at 11/19/21 1700   sodium chloride      ceFAZolin (ANCEF) IV Stopped (11/25/21 0158)     LOS: 24 days   Time spent:  Azucena Fallen, DO [facility]  If 7PM-7AM, please contact night-coverage [email or web address]  11/25/2021, 8:18 AM

## 2021-11-25 NOTE — Progress Notes (Addendum)
Called [facility] 2c nurse to give report ,she is in with a patient I gave my number so Thurston Hole can call back.

## 2021-11-26 DIAGNOSIS — Z950 Presence of cardiac pacemaker: Secondary | ICD-10-CM | POA: Diagnosis not present

## 2021-11-26 DIAGNOSIS — I5042 Chronic combined systolic (congestive) and diastolic (congestive) heart failure: Secondary | ICD-10-CM | POA: Diagnosis not present

## 2021-11-26 DIAGNOSIS — I480 Paroxysmal atrial fibrillation: Secondary | ICD-10-CM | POA: Diagnosis not present

## 2021-11-26 DIAGNOSIS — I2510 Atherosclerotic heart disease of native coronary artery without angina pectoris: Secondary | ICD-10-CM | POA: Diagnosis not present

## 2021-11-26 LAB — COOXEMETRY PANEL
Carboxyhemoglobin: 1.3 % (ref 0.5–1.5)
Methemoglobin: 1 % (ref 0.0–1.5)
O2 Saturation: 63.4 %
Total hemoglobin: 8.4 g/dL — ABNORMAL LOW (ref 12.0–16.0)

## 2021-11-26 LAB — CBC WITH DIFFERENTIAL/PLATELET
Abs Immature Granulocytes: 0.04 10*3/uL (ref 0.00–0.07)
Basophils Absolute: 0 10*3/uL (ref 0.0–0.1)
Basophils Relative: 1 %
Eosinophils Absolute: 0.2 10*3/uL (ref 0.0–0.5)
Eosinophils Relative: 2 %
HCT: 24.9 % — ABNORMAL LOW (ref 39.0–52.0)
Hemoglobin: 8.1 g/dL — ABNORMAL LOW (ref 13.0–17.0)
Immature Granulocytes: 1 %
Lymphocytes Relative: 21 %
Lymphs Abs: 1.6 10*3/uL (ref 0.7–4.0)
MCH: 27 pg (ref 26.0–34.0)
MCHC: 32.5 g/dL (ref 30.0–36.0)
MCV: 83 fL (ref 80.0–100.0)
Monocytes Absolute: 0.9 10*3/uL (ref 0.1–1.0)
Monocytes Relative: 12 %
Neutro Abs: 5 10*3/uL (ref 1.7–7.7)
Neutrophils Relative %: 63 %
Platelets: 332 10*3/uL (ref 150–400)
RBC: 3 MIL/uL — ABNORMAL LOW (ref 4.22–5.81)
RDW: 17.2 % — ABNORMAL HIGH (ref 11.5–15.5)
WBC: 7.8 10*3/uL (ref 4.0–10.5)
nRBC: 0 % (ref 0.0–0.2)

## 2021-11-26 LAB — GLUCOSE, CAPILLARY
Glucose-Capillary: 129 mg/dL — ABNORMAL HIGH (ref 70–99)
Glucose-Capillary: 204 mg/dL — ABNORMAL HIGH (ref 70–99)
Glucose-Capillary: 141 mg/dL — ABNORMAL HIGH (ref 70–99)
Glucose-Capillary: 116 mg/dL — ABNORMAL HIGH (ref 70–99)

## 2021-11-26 LAB — COMPREHENSIVE METABOLIC PANEL
ALT: <5 U/L (ref 0–44)
AST: 28 U/L (ref 15–41)
Albumin: 2.6 g/dL — ABNORMAL LOW (ref 3.5–5.0)
Alkaline Phosphatase: 211 U/L — ABNORMAL HIGH (ref 38–126)
Anion gap: 11 (ref 5–15)
BUN: 124 mg/dL — ABNORMAL HIGH (ref 8–23)
CO2: 24 mmol/L (ref 22–32)
Calcium: 8.1 mg/dL — ABNORMAL LOW (ref 8.9–10.3)
Chloride: 94 mmol/L — ABNORMAL LOW (ref 98–111)
Creatinine, Ser: 3.01 mg/dL — ABNORMAL HIGH (ref 0.61–1.24)
GFR, Estimated: 21 mL/min — ABNORMAL LOW (ref >60)
Glucose, Bld: 110 mg/dL — ABNORMAL HIGH (ref 70–99)
Potassium: 4.9 mmol/L (ref 3.5–5.1)
Sodium: 129 mmol/L — ABNORMAL LOW (ref 135–145)
Total Bilirubin: 0.3 mg/dL (ref 0.3–1.2)
Total Protein: 6.7 g/dL (ref 6.5–8.1)

## 2021-11-26 LAB — MAGNESIUM: Magnesium: 2.7 mg/dL — ABNORMAL HIGH (ref 1.7–2.4)

## 2021-11-26 LAB — LACTIC ACID, PLASMA: Lactic Acid, Venous: 0.8 mmol/L (ref 0.5–1.9)

## 2021-11-27 DIAGNOSIS — I2510 Atherosclerotic heart disease of native coronary artery without angina pectoris: Secondary | ICD-10-CM | POA: Diagnosis not present

## 2021-11-27 DIAGNOSIS — R6521 Severe sepsis with septic shock: Secondary | ICD-10-CM | POA: Diagnosis not present

## 2021-11-27 DIAGNOSIS — I5042 Chronic combined systolic (congestive) and diastolic (congestive) heart failure: Secondary | ICD-10-CM | POA: Diagnosis not present

## 2021-11-27 DIAGNOSIS — A419 Sepsis, unspecified organism: Secondary | ICD-10-CM | POA: Diagnosis not present

## 2021-11-27 DIAGNOSIS — I480 Paroxysmal atrial fibrillation: Secondary | ICD-10-CM | POA: Diagnosis not present

## 2021-11-27 DIAGNOSIS — Z950 Presence of cardiac pacemaker: Secondary | ICD-10-CM | POA: Diagnosis not present

## 2021-11-27 LAB — BASIC METABOLIC PANEL
Anion gap: 10 (ref 5–15)
BUN: 118 mg/dL — ABNORMAL HIGH (ref 8–23)
CO2: 23 mmol/L (ref 22–32)
Calcium: 7.9 mg/dL — ABNORMAL LOW (ref 8.9–10.3)
Chloride: 96 mmol/L — ABNORMAL LOW (ref 98–111)
Creatinine, Ser: 2.75 mg/dL — ABNORMAL HIGH (ref 0.61–1.24)
GFR, Estimated: 23 mL/min — ABNORMAL LOW (ref >60)
Glucose, Bld: 75 mg/dL (ref 70–99)
Potassium: 4.1 mmol/L (ref 3.5–5.1)
Sodium: 129 mmol/L — ABNORMAL LOW (ref 135–145)

## 2021-11-27 LAB — POCT I-STAT EG7
Acid-Base Excess: 0 mmol/L (ref 0.0–2.0)
Acid-Base Excess: 1 mmol/L (ref 0.0–2.0)
Acid-Base Excess: 1 mmol/L (ref 0.0–2.0)
Bicarbonate: 25.4 mmol/L (ref 20.0–28.0)
Bicarbonate: 26.7 mmol/L (ref 20.0–28.0)
Bicarbonate: 26.9 mmol/L (ref 20.0–28.0)
Calcium, Ion: 1.16 mmol/L (ref 1.15–1.40)
Calcium, Ion: 1.18 mmol/L (ref 1.15–1.40)
Calcium, Ion: 1.2 mmol/L (ref 1.15–1.40)
HCT: 26 % — ABNORMAL LOW (ref 39.0–52.0)
HCT: 27 % — ABNORMAL LOW (ref 39.0–52.0)
HCT: 28 % — ABNORMAL LOW (ref 39.0–52.0)
Hemoglobin: 8.8 g/dL — ABNORMAL LOW (ref 13.0–17.0)
Hemoglobin: 9.2 g/dL — ABNORMAL LOW (ref 13.0–17.0)
Hemoglobin: 9.5 g/dL — ABNORMAL LOW (ref 13.0–17.0)
O2 Saturation: 49 %
O2 Saturation: 49 %
O2 Saturation: 54 %
Potassium: 4.7 mmol/L (ref 3.5–5.1)
Potassium: 5 mmol/L (ref 3.5–5.1)
Potassium: 5 mmol/L (ref 3.5–5.1)
Sodium: 133 mmol/L — ABNORMAL LOW (ref 135–145)
Sodium: 133 mmol/L — ABNORMAL LOW (ref 135–145)
Sodium: 134 mmol/L — ABNORMAL LOW (ref 135–145)
TCO2: 27 mmol/L (ref 22–32)
TCO2: 28 mmol/L (ref 22–32)
TCO2: 28 mmol/L (ref 22–32)
pCO2, Ven: 45.4 mmHg (ref 44–60)
pCO2, Ven: 46.6 mmHg (ref 44–60)
pCO2, Ven: 46.9 mmHg (ref 44–60)
pH, Ven: 7.355 (ref 7.25–7.43)
pH, Ven: 7.365 (ref 7.25–7.43)
pH, Ven: 7.366 (ref 7.25–7.43)
pO2, Ven: 28 mmHg — CL (ref 32–45)
pO2, Ven: 28 mmHg — CL (ref 32–45)
pO2, Ven: 30 mmHg — CL (ref 32–45)

## 2021-11-27 LAB — CBC
HCT: 24.5 % — ABNORMAL LOW (ref 39.0–52.0)
Hemoglobin: 8.1 g/dL — ABNORMAL LOW (ref 13.0–17.0)
MCH: 27.5 pg (ref 26.0–34.0)
MCHC: 33.1 g/dL (ref 30.0–36.0)
MCV: 83.1 fL (ref 80.0–100.0)
Platelets: 330 10*3/uL (ref 150–400)
RBC: 2.95 MIL/uL — ABNORMAL LOW (ref 4.22–5.81)
RDW: 17.3 % — ABNORMAL HIGH (ref 11.5–15.5)
WBC: 7.5 10*3/uL (ref 4.0–10.5)
nRBC: 0 % (ref 0.0–0.2)

## 2021-11-27 LAB — GLUCOSE, CAPILLARY
Glucose-Capillary: 82 mg/dL (ref 70–99)
Glucose-Capillary: 161 mg/dL — ABNORMAL HIGH (ref 70–99)
Glucose-Capillary: 113 mg/dL — ABNORMAL HIGH (ref 70–99)
Glucose-Capillary: 76 mg/dL (ref 70–99)

## 2021-11-27 LAB — COOXEMETRY PANEL
Carboxyhemoglobin: 1.6 % — ABNORMAL HIGH (ref 0.5–1.5)
Methemoglobin: <0.7 % (ref 0.0–1.5)
O2 Saturation: 70.9 %
Total hemoglobin: 9 g/dL — ABNORMAL LOW (ref 12.0–16.0)

## 2021-11-27 MED ORDER — HYDRALAZINE HCL 20 MG/ML IJ SOLN: 10.0000 mg | INTRAMUSCULAR | Status: DC | PRN

## 2021-11-27 MED ORDER — IPRATROPIUM-ALBUTEROL 0.5-2.5 (3) MG/3ML IN SOLN: 3.0000 mL | RESPIRATORY_TRACT | Status: DC | PRN

## 2021-11-27 MED ORDER — TRAZODONE HCL 50 MG PO TABS
50.0000 mg | ORAL_TABLET | Freq: Every evening | ORAL | Status: DC | PRN
  Administered 2021-11-30: 50 mg via ORAL
  Filled 2021-11-27: qty 1

## 2021-11-27 MED ORDER — SENNOSIDES-DOCUSATE SODIUM 8.6-50 MG PO TABS: 1.0000 | ORAL_TABLET | Freq: Every evening | ORAL | Status: DC | PRN

## 2021-11-27 NOTE — Progress Notes (Signed)
Daughter called for update. RN gave her details about pt labs and plan to wean off dobutamine.   Daughter requests that Provider give her a call.  [phone]

## 2021-11-27 NOTE — Progress Notes (Signed)
Mobility Specialist Progress Note    11/27/21 1551  Mobility  Activity Ambulated with assistance in hallway  Level of Assistance Moderate assist, patient does 50-74%  Assistive Device Front wheel walker  Distance Ambulated (ft) 80 ft  Activity Response Tolerated fair  $Mobility charge 1 Mobility   Pre-Mobility: 102 HR During Mobility: 110 HR Post-Mobility: 105 HR  Pt received in bed and agreeable. C/o SOB with activity taking multiple short standing rest breaks to catch his breath. Returned to chair with call bell in reach.    [place] Mobility Specialist

## 2021-11-27 NOTE — Progress Notes (Signed)
Progress Note  Patient Name: Glenn [name] Date of Encounter: 11/27/2021  [facility] Cardiologist: [facility]  Subjective   Feels okay this morning. No chest pain or SOB.   CVP 8 however clinically, JVP looks up Cr stable at 2.79 Co-ox 65  RHC 11/25/21: RA = 12  RV = 65/11 PA = 62/18 (35) PCW = 13  Fick cardiac output/index = 5.4/2.8 Thermodilution CO/CI = 4.3/2.2 PVR = 4.5 WU FA sat = 97% PA sat = 49%, 54% PAPi = 3.7   Inpatient Medications    Scheduled Meds:  (feeding supplement) PROSource Plus  30 mL Oral TID BM   apixaban  5 mg Oral BID   artificial tears   Both Eyes QHS   vitamin C  500 mg Oral BID   atorvastatin  80 mg Oral QHS   chlorhexidine  15 mL Mouth Rinse BID   Chlorhexidine Gluconate Cloth  6 each Topical Q0600   finasteride  5 mg Oral Daily   insulin aspart  0-15 Units Subcutaneous TID WC   insulin aspart  0-5 Units Subcutaneous QHS   insulin detemir  15 Units Subcutaneous Daily   ketotifen  1 drop Both Eyes BID   latanoprost  1 drop Both Eyes QHS   mouth rinse  15 mL Mouth Rinse q12n4p   multivitamin with minerals  1 tablet Oral Daily   pantoprazole  40 mg Oral Q1200   polyethylene glycol  17 g Oral BID   sodium chloride flush  10-40 mL Intracatheter Q12H   sodium chloride flush  10-40 mL Intracatheter Q12H   tamsulosin  0.8 mg Oral QHS   zinc sulfate  220 mg Oral Daily   Continuous Infusions:  sodium chloride 250 mL (11/02/21 0205)   sodium chloride 10 mL/hr at 11/27/21 0512    ceFAZolin (ANCEF) IV 2 g (11/27/21 1137)   DOBUTamine 2.5 mcg/kg/min (11/27/21 0512)   PRN Meds: acetaminophen, hydrALAZINE, ipratropium-albuterol, ondansetron (ZOFRAN) IV, oxyCODONE, polyvinyl alcohol, senna-docusate, traZODone   Vital Signs    Vitals:   11/27/21 0705 11/27/21 0724 11/27/21 1132 11/27/21 1607  BP:  (!) 109/54 (!) 144/89 130/74  Pulse:  96 100 (!) 105  Resp: 19 (!) 21 (!) 21 18  Temp:  98.3 F (36.8 C) 98.2 F (36.8 C) 98.3 F  (36.8 C)  TempSrc:  Oral Oral Oral  SpO2:  97% 99% 98%  Weight: 75.5 kg     Height:        Intake/Output Summary (Last 24 hours) at 11/27/2021 1945 Last data filed at 11/27/2021 1300 Gross per 24 hour  Intake 1175.9 ml  Output 1326 ml  Net -150.1 ml       11/27/2021    7:05 AM 11/24/2021    6:33 AM 11/23/2021    5:34 AM  Last 3 Weights  Weight (lbs) 166 lb 7.2 oz 162 lb 4.1 oz 162 lb 0.6 oz  Weight (kg) 75.5 kg 73.6 kg 73.5 kg      Telemetry    A sensed V paced- Personally Reviewed  ECG    No new tracing today - Personally Reviewed  Physical Exam   GEN: Elderly, frail appearing HEENT: Normal NECK: JVD to the angle of the mandible CARDIAC: RRR, 3/6 systolic murmur RESPIRATORY: Crackles at the bases ABDOMEN: Soft, non-tender, non-distended MUSCULOSKELETAL: 1+ bilateral edema, warm SKIN: Warm and dry NEUROLOGIC:  Alert and oriented x 3 PSYCHIATRIC:  Normal affect   Labs    High Sensitivity Troponin:  Recent Labs  Lab 11/02/21 0119 11/02/21 0347  TROPONINIHS 83* 83*      Chemistry Recent Labs  Lab 11/22/21 0359 11/23/21 0305 11/25/21 0401 11/26/21 0126 11/27/21 0500  NA 134*   < > 131* 129* 129*  K 4.0   < > 4.9 4.9 4.1  CL 95*   < > 97* 94* 96*  CO2 28   < > 25 24 23   GLUCOSE 126*   < > 129* 110* 75  BUN 74*   < > 113* 124* 118*  CREATININE 1.69*   < > 2.65* 3.01* 2.75*  CALCIUM 8.4*   < > 8.3* 8.1* 7.9*  MG  --   --   --  2.7*  --   PROT 6.7  --   --  6.7  --   ALBUMIN 2.7*  --   --  2.6*  --   AST 24  --   --  28  --   ALT 6  --   --  <5  --   ALKPHOS 145*  --   --  211*  --   BILITOT 0.4  --   --  0.3  --   GFRNONAA 41*   < > 24* 21* 23*  ANIONGAP 11   < > 9 11 10    < > = values in this interval not displayed.     Lipids  Recent Labs  Lab 11/22/21 0359  CHOL 77  TRIG 45  HDL 35*  LDLCALC 33  CHOLHDL 2.2     Hematology Recent Labs  Lab 11/25/21 0401 11/26/21 0126 11/27/21 0500  WBC 7.3 7.8 7.5  RBC 2.89* 3.00* 2.95*   HGB 8.0* 8.1* 8.1*  HCT 24.4* 24.9* 24.5*  MCV 84.4 83.0 83.1  MCH 27.7 27.0 27.5  MCHC 32.8 32.5 33.1  RDW 17.2* 17.2* 17.3*  PLT 288 332 330    Thyroid No results for input(s): "TSH", "FREET4" in the last 168 hours.  BNP Recent Labs  Lab 11/21/21 0355  BNP 1,403.5*     DDimer No results for input(s): "DDIMER" in the last 168 hours.   Radiology    No results found.  Cardiac Studies  RHC 11/24/21: Findings:   RA = 12  RV = 65/11 PA = 62/18 (35) PCW = 13  Fick cardiac output/index = 5.4/2.8 Thermodilution CO/CI = 4.3/2.2 PVR = 4.5 WU FA sat = 97% PA sat = 49%, 54% PAPi = 3.7   Assessment: 1. Normal left sided filling pressures  2. 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 Mildly decreased cardiac output   Plan/Discussion:   He seems to have R>L HF. Hold diuretics for now. Co-ox on the low end. Can follow co-ox via PICC and adjust meds as needed     Echocardiogram 11/02/2021: Impressions: 1. There are some endomyocardial trabeculations noted but does not meet  criteria for left ventricular non-compaction. Left ventricular ejection  fraction, by estimation, is <20%. The left ventricle has severely  decreased function. The left ventricle has  no regional wall motion abnormalities. Left ventricular diastolic  parameters are consistent with Grade II diastolic dysfunction  (pseudonormalization). There is the interventricular septum is flattened  in systole and diastole, consistent with right  ventricular pressure and volume overload.   2. Right ventricular systolic function is normal. The right ventricular  size is normal.   3. The mitral valve is normal in structure. Mild mitral valve  regurgitation. No evidence of mitral stenosis.  4. Tricuspid valve regurgitation is moderate.   5. The aortic valve is tricuspid. Aortic valve regurgitation is not  visualized. No aortic stenosis is present.   6. There is borderline dilatation of the aortic root,  measuring 37 mm.   7. The inferior vena cava is normal in size with greater than 50%  respiratory variability, suggesting right atrial pressure of 3 mmHg.   Comparison(s): No prior Echocardiogram.  Patient Profile     81 y.o. 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 He was transferred to [facility] where he was admitted to medical ICU. He underwent disc aspiration on 6/8 by IR which grew Klebsiella. He was treated with IV fluids per sepsis protocol and then went into acute CHF. He was started on IV Lasix but remained significant volume overloaded. Therefore, Cardiology was consulted for assistance on 11/20/2021.   Assessment & Plan    #Acute on Chronic Combined CHF: -TTE 11/02/21 with LVEF of <20% with no regional wall motion abnormalities and grade 2 diastolic dysfunction. RV normal but interventricular septum is flattened in systolic and diastole consistent with RV pressure and volume overload -RHC with normal left sided filling pressures with elevated RAP and moderate pulmonary HTN -Co-ox low in 50s on 11/25/21 now initiated on dobutamine 2.61mcg/kg/min with improvement in co-ox to 60-70s -On brief trial of dobutamine however renal function has stabilized; will likely transition to palliative -Will do trial of lasix 100mg  IV x1 dose and see if he responds; again, suspect he will be made palliative -Trend CVP and co-ox -No ACEi/ARB/ARNI, MRA, or SGLT2 inhibitor at this time due to AKI  -Holding coreg due to need for inotropic support -Continue to monitor daily weights, strict I/Os, and renal function  #CAD #Elevated  Troponin - S/p remote CABG in 1998. High-sensitivity troponin minimally elevated and flat at 83 >> 83.  - No chest pain with low suspicion for ACS - Echo as above - No aspirin due to need for anticoagulation - Continue high-intensity statin  #Paroxysmal Atrial Fibrillation -Patient reports recently being diagnosed with atrial fibrillation s/p DCCV about 5 weeks ago - Telemetry shows A sensed V pacing  - Home Coreg on hold due to need for inotropic support - Continue Eliquis 5mg  BID  #S/p [employer] ICD -Sound like this is for primary prevention for sudden cardiac death given CHF. Device was interrogated on 11/02/2021 and showed no sustained VT in detection zone - Followed at the [facility]  #Hypertension - Controlled off BP meds  #Hyperlipidemia - Continue Lipitor 80mg  daily. 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 He would like to try lasix today but if no improvement, he is open to transitioning to palliative therapies. Agree with having palliative team evaluate him.  Otherwise,  per primary team. - Septic shock - resolved - Bacteremia due to Klebsiella pneumoniae  - Osteomyelitis of lumbar spine - Discitis  - UTI - Bladder outlet obstruction - Type 2 diabetes mellitus - Moderate malnutrition - Deconditioning  For questions or updates, please contact [facility] Please consult [email or web address] for contact info under     Signed, Meriam Sprague, MD  11/27/2021, 7:45 PM

## 2021-11-28 DIAGNOSIS — I5042 Chronic combined systolic (congestive) and diastolic (congestive) heart failure: Secondary | ICD-10-CM | POA: Diagnosis not present

## 2021-11-28 DIAGNOSIS — A419 Sepsis, unspecified organism: Secondary | ICD-10-CM | POA: Diagnosis not present

## 2021-11-28 DIAGNOSIS — Z515 Encounter for palliative care: Secondary | ICD-10-CM

## 2021-11-28 DIAGNOSIS — R6521 Severe sepsis with septic shock: Secondary | ICD-10-CM | POA: Diagnosis not present

## 2021-11-28 DIAGNOSIS — Z7189 Other specified counseling: Secondary | ICD-10-CM | POA: Diagnosis not present

## 2021-11-28 DIAGNOSIS — I2510 Atherosclerotic heart disease of native coronary artery without angina pectoris: Secondary | ICD-10-CM | POA: Diagnosis not present

## 2021-11-28 DIAGNOSIS — I480 Paroxysmal atrial fibrillation: Secondary | ICD-10-CM | POA: Diagnosis not present

## 2021-11-28 DIAGNOSIS — Z950 Presence of cardiac pacemaker: Secondary | ICD-10-CM | POA: Diagnosis not present

## 2021-11-28 LAB — GLUCOSE, CAPILLARY
Glucose-Capillary: 90 mg/dL (ref 70–99)
Glucose-Capillary: 180 mg/dL — ABNORMAL HIGH (ref 70–99)
Glucose-Capillary: 60 mg/dL — ABNORMAL LOW (ref 70–99)
Glucose-Capillary: 67 mg/dL — ABNORMAL LOW (ref 70–99)
Glucose-Capillary: 71 mg/dL (ref 70–99)
Glucose-Capillary: 87 mg/dL (ref 70–99)

## 2021-11-28 LAB — CBC
HCT: 25 % — ABNORMAL LOW (ref 39.0–52.0)
Hemoglobin: 8 g/dL — ABNORMAL LOW (ref 13.0–17.0)
MCH: 26.7 pg (ref 26.0–34.0)
MCHC: 32 g/dL (ref 30.0–36.0)
MCV: 83.3 fL (ref 80.0–100.0)
Platelets: 332 10*3/uL (ref 150–400)
RBC: 3 MIL/uL — ABNORMAL LOW (ref 4.22–5.81)
RDW: 17.4 % — ABNORMAL HIGH (ref 11.5–15.5)
WBC: 8 10*3/uL (ref 4.0–10.5)
nRBC: 0 % (ref 0.0–0.2)

## 2021-11-28 LAB — BASIC METABOLIC PANEL
Anion gap: 9 (ref 5–15)
BUN: 114 mg/dL — ABNORMAL HIGH (ref 8–23)
CO2: 25 mmol/L (ref 22–32)
Calcium: 8.4 mg/dL — ABNORMAL LOW (ref 8.9–10.3)
Chloride: 98 mmol/L (ref 98–111)
Creatinine, Ser: 2.79 mg/dL — ABNORMAL HIGH (ref 0.61–1.24)
GFR, Estimated: 22 mL/min — ABNORMAL LOW (ref >60)
Glucose, Bld: 77 mg/dL (ref 70–99)
Potassium: 4.5 mmol/L (ref 3.5–5.1)
Sodium: 132 mmol/L — ABNORMAL LOW (ref 135–145)

## 2021-11-28 LAB — COOXEMETRY PANEL
Carboxyhemoglobin: 1.5 % (ref 0.5–1.5)
Methemoglobin: <0.7 % (ref 0.0–1.5)
O2 Saturation: 65 %
Total hemoglobin: 9.9 g/dL — ABNORMAL LOW (ref 12.0–16.0)

## 2021-11-28 LAB — MAGNESIUM: Magnesium: 2.8 mg/dL — ABNORMAL HIGH (ref 1.7–2.4)

## 2021-11-28 LAB — LIPOPROTEIN A (LPA): Lipoprotein (a): 167.1 nmol/L — ABNORMAL HIGH (ref <75.0)

## 2021-11-28 MED ORDER — DEXTROSE 5 % IV SOLN
100.0000 mg | Freq: Once | INTRAVENOUS | Status: AC
  Administered 2021-11-28: 100 mg via INTRAVENOUS
  Filled 2021-11-28: qty 10

## 2021-11-28 NOTE — Progress Notes (Signed)
Progress Note  Patient Name: Glenn [name] Date of Encounter: 11/29/2021  [facility] Cardiologist: [facility]  Subjective   Feels much better today. Got a good night rest. Discussed that renal function is essentially unchanged. He would like to continue dobutamine until his daughter arrives. Would otherwise like to be able to live his days peacefully. Will minimize vital checks and lab draws.   Given 1 dose lasix 100mg  IV yesterday Cr 2.79>2.86 Co-ox 75 today Net negative 2L  Had discussion with palliative care yesterday. Open to transitioning to comfort. He would just like his daughter to be here who will be returning from vacation by the end of the week. Will keep dobutamine on board until family arrives and then plan to transition to palliative measures at that time  RHC 11/25/21: RA = 12  RV = 65/11 PA = 62/18 (35) PCW = 13  Fick cardiac output/index = 5.4/2.8 Thermodilution CO/CI = 4.3/2.2 PVR = 4.5 WU FA sat = 97% PA sat = 49%, 54% PAPi = 3.7   Inpatient Medications    Scheduled Meds:  (feeding supplement) PROSource Plus  30 mL Oral TID BM   apixaban  5 mg Oral BID   artificial tears   Both Eyes QHS   vitamin C  500 mg Oral BID   atorvastatin  80 mg Oral QHS   chlorhexidine  15 mL Mouth Rinse BID   Chlorhexidine Gluconate Cloth  6 each Topical Q0600   finasteride  5 mg Oral Daily   insulin aspart  0-15 Units Subcutaneous TID WC   insulin aspart  0-5 Units Subcutaneous QHS   insulin detemir  15 Units Subcutaneous Daily   ketotifen  1 drop Both Eyes BID   latanoprost  1 drop Both Eyes QHS   mouth rinse  15 mL Mouth Rinse q12n4p   multivitamin with minerals  1 tablet Oral Daily   pantoprazole  40 mg Oral Q1200   polyethylene glycol  17 g Oral BID   sodium chloride flush  10-40 mL Intracatheter Q12H   sodium chloride flush  10-40 mL Intracatheter Q12H   tamsulosin  0.8 mg Oral QHS   zinc sulfate  220 mg Oral Daily   Continuous Infusions:  sodium  chloride 250 mL (11/02/21 0205)   sodium chloride 10 mL/hr at 11/27/21 0512    ceFAZolin (ANCEF) IV 2 g (11/28/21 2128)   DOBUTamine 2.5 mcg/kg/min (11/29/21 0319)   PRN Meds: acetaminophen, hydrALAZINE, ipratropium-albuterol, ondansetron (ZOFRAN) IV, oxyCODONE, polyvinyl alcohol, senna-docusate, traZODone   Vital Signs    Vitals:   11/28/21 1927 11/28/21 2325 11/29/21 0319 11/29/21 0737  BP: 127/70 128/74 104/88 (!) 112/58  Pulse: (!) 104 (!) 102 (!) 102 (!) 103  Resp: 15 18 20 15   Temp: 98.6 F (37 C) 98.1 F (36.7 C) 98 F (36.7 C) 98 F (36.7 C)  TempSrc: Oral Oral Oral Oral  SpO2: 99% 98% 96% 96%  Weight:   73.1 kg   Height:        Intake/Output Summary (Last 24 hours) at 11/29/2021 0839 Last data filed at 11/29/2021 0600 Gross per 24 hour  Intake 384.88 ml  Output 2500 ml  Net -2115.12 ml      11/29/2021    3:19 AM 11/28/2021    6:30 AM 11/27/2021    7:05 AM  Last 3 Weights  Weight (lbs) 161 lb 2.5 oz 152 lb 1.9 oz 166 lb 7.2 oz  Weight (kg) 73.1 kg 69 kg 75.5 kg  Telemetry    A sensed V paced; runs of NSVT- Personally Reviewed  ECG    No new tracing today - Personally Reviewed  Physical Exam   GEN: Elderly, frail appearing HEENT: Normal NECK: JVD to the angle of the mandible CARDIAC: RRR, 3/6 systolic murmur RESPIRATORY: Crackles at the bases ABDOMEN: Soft, non-tender, non-distended MUSCULOSKELETAL: 1+ bilateral edema, warm SKIN: Warm and dry NEUROLOGIC:  Alert and oriented x 3 PSYCHIATRIC:  Normal affect   Labs    High Sensitivity Troponin:   Recent Labs  Lab 11/02/21 0119 11/02/21 0347  TROPONINIHS 83* 83*     Chemistry Recent Labs  Lab 11/26/21 0126 11/27/21 0500 11/28/21 0405 11/29/21 0330  NA 129* 129* 132* 132*  K 4.9 4.1 4.5 4.2  CL 94* 96* 98 95*  CO2 24 23 25 24   GLUCOSE 110* 75 77 119*  BUN 124* 118* 114* 112*  CREATININE 3.01* 2.75* 2.79* 2.86*  CALCIUM 8.1* 7.9* 8.4* 8.4*  MG 2.7*  --  2.8* 2.7*  PROT 6.7  --    --   --   ALBUMIN 2.6*  --   --   --   AST 28  --   --   --   ALT <5  --   --   --   ALKPHOS 211*  --   --   --   BILITOT 0.3  --   --   --   GFRNONAA 21* 23* 22* 22*  ANIONGAP 11 10 9 13     Lipids No results for input(s): "CHOL", "TRIG", "HDL", "LABVLDL", "LDLCALC", "CHOLHDL" in the last 168 hours.  Hematology Recent Labs  Lab 11/27/21 0500 11/28/21 0405 11/29/21 0330  WBC 7.5 8.0 9.5  RBC 2.95* 3.00* 2.93*  HGB 8.1* 8.0* 8.0*  HCT 24.5* 25.0* 24.5*  MCV 83.1 83.3 83.6  MCH 27.5 26.7 27.3  MCHC 33.1 32.0 32.7  RDW 17.3* 17.4* 17.8*  PLT 330 332 322   Thyroid No results for input(s): "TSH", "FREET4" in the last 168 hours.  BNP No results for input(s): "BNP", "PROBNP" in the last 168 hours.   DDimer No results for input(s): "DDIMER" in the last 168 hours.   Radiology    No results found.  Cardiac Studies  RHC 11/09/2021: Findings:   RA = 12  RV = 65/11 PA = 62/18 (35) PCW = 13  Fick cardiac output/index = 5.4/2.8 Thermodilution CO/CI = 4.3/2.2 PVR = 4.5 WU FA sat = 97% PA sat = 49%, 54% PAPi = 3.7   Assessment: 1. Normal left sided filling pressures  2. 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 Mildly decreased cardiac output   Plan/Discussion:   He seems to have R>L HF. Hold diuretics for now. Co-ox on the low end. Can follow co-ox via PICC and adjust meds as needed     Echocardiogram 11/02/2021: Impressions: 1. There are some endomyocardial trabeculations noted but does not meet  criteria for left ventricular non-compaction. Left ventricular ejection  fraction, by estimation, is <20%. The left ventricle has severely  decreased function. The left ventricle has  no regional wall motion abnormalities. Left ventricular diastolic  parameters are consistent with Grade II diastolic dysfunction  (pseudonormalization). There is the interventricular septum is flattened  in systole and diastole, consistent with right  ventricular pressure and volume  overload.   2. Right ventricular systolic function is normal. The right ventricular  size is normal.   3. The mitral valve is normal in structure. Mild  mitral valve  regurgitation. No evidence of mitral stenosis.   4. Tricuspid valve regurgitation is moderate.   5. The aortic valve is tricuspid. Aortic valve regurgitation is not  visualized. No aortic stenosis is present.   6. There is borderline dilatation of the aortic root, measuring 37 mm.   7. The inferior vena cava is normal in size with greater than 50%  respiratory variability, suggesting right atrial pressure of 3 mmHg.   Comparison(s): No prior Echocardiogram.  Patient Profile     81 y.o. 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 He was transferred to [facility] where he was admitted to medical ICU. He underwent disc aspiration on 6/8 by IR which grew Klebsiella. He was treated with IV fluids per sepsis protocol and then went into acute CHF. He was started on IV Lasix but remained significant volume overloaded. Therefore, Cardiology was consulted for assistance on 11/20/2021.   Assessment & Plan    #Acute on Chronic Combined CHF: -TTE 11/02/21 with LVEF of <20% with no regional wall motion abnormalities and grade 2 diastolic dysfunction. RV normal but interventricular septum is flattened in systolic and diastole consistent with RV pressure and volume overload -RHC with normal left sided filling pressures with elevated RAP and moderate pulmonary HTN -Will plan to continue dobutamine until family arrives by the end of the week and then transition to palliative measures   -Trend CVP and co-ox -No ACEi/ARB/ARNI, MRA, or SGLT2 inhibitor at this time due to AKI  -Holding coreg due to need for inotropic support -Continue to monitor daily weights, strict I/Os, and renal function  #CAD #Elevated Troponin - S/p remote CABG in 1998. High-sensitivity troponin minimally elevated and flat at 83 >> 83.  - No chest pain with low suspicion for ACS - Echo as above - No aspirin due to need for anticoagulation - Continue high-intensity statin - Plan to transition to palliative measures once family arrives  #Paroxysmal Atrial Fibrillation -Patient reports recently being diagnosed with atrial fibrillation s/p DCCV about 5 weeks ago - Telemetry shows A sensed V pacing with runs of NSVT  - Home Coreg on hold due to need for inotropic support - Continue Eliquis 5mg  BID  #S/p [employer] ICD -Placed for primary prevention for sudden cardiac death given CHF. Device was interrogated on 11/02/2021 and showed no sustained VT in detection zone - Followed at the [facility]  #Hypertension - Controlled off BP meds  #Hyperlipidemia - Continue Lipitor 80mg  daily. 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 He would like his daughter to arrive from vacation before switching to comfort focused care. Will continue dobutamine until that time. Can stop lab draws, vitals in the middle of the night, PT. Appreciate palliative care support.  Otherwise, per primary team. - Septic shock - resolved - Bacteremia due to Klebsiella  pneumoniae  - Osteomyelitis of lumbar spine - Discitis  - UTI - Bladder outlet obstruction - Type 2 diabetes mellitus -  Moderate malnutrition - Deconditioning  For questions or updates, please contact [facility] Please consult [email or web address] for contact info under     Signed, Freada Bergeron, MD  11/29/2021, 8:39 AM

## 2021-11-29 DIAGNOSIS — Z515 Encounter for palliative care: Secondary | ICD-10-CM | POA: Diagnosis not present

## 2021-11-29 DIAGNOSIS — N179 Acute kidney failure, unspecified: Secondary | ICD-10-CM | POA: Diagnosis not present

## 2021-11-29 DIAGNOSIS — E876 Hypokalemia: Secondary | ICD-10-CM | POA: Diagnosis not present

## 2021-11-29 DIAGNOSIS — R7881 Bacteremia: Secondary | ICD-10-CM | POA: Diagnosis not present

## 2021-11-29 DIAGNOSIS — N1832 Chronic kidney disease, stage 3b: Secondary | ICD-10-CM | POA: Diagnosis not present

## 2021-11-29 DIAGNOSIS — I2510 Atherosclerotic heart disease of native coronary artery without angina pectoris: Secondary | ICD-10-CM | POA: Diagnosis not present

## 2021-11-29 DIAGNOSIS — Z7189 Other specified counseling: Secondary | ICD-10-CM | POA: Diagnosis not present

## 2021-11-29 DIAGNOSIS — I5042 Chronic combined systolic (congestive) and diastolic (congestive) heart failure: Secondary | ICD-10-CM | POA: Diagnosis not present

## 2021-11-29 LAB — CBC
HCT: 24.5 % — ABNORMAL LOW (ref 39.0–52.0)
Hemoglobin: 8 g/dL — ABNORMAL LOW (ref 13.0–17.0)
MCH: 27.3 pg (ref 26.0–34.0)
MCHC: 32.7 g/dL (ref 30.0–36.0)
MCV: 83.6 fL (ref 80.0–100.0)
Platelets: 322 10*3/uL (ref 150–400)
RBC: 2.93 MIL/uL — ABNORMAL LOW (ref 4.22–5.81)
RDW: 17.8 % — ABNORMAL HIGH (ref 11.5–15.5)
WBC: 9.5 10*3/uL (ref 4.0–10.5)
nRBC: 0 % (ref 0.0–0.2)

## 2021-11-29 LAB — COOXEMETRY PANEL
Carboxyhemoglobin: <0.3 % — ABNORMAL LOW (ref 0.5–1.5)
Methemoglobin: 1.1 % (ref 0.0–1.5)
O2 Saturation: 75 %
Total hemoglobin: 8.4 g/dL — ABNORMAL LOW (ref 12.0–16.0)

## 2021-11-29 LAB — GLUCOSE, CAPILLARY
Glucose-Capillary: 133 mg/dL — ABNORMAL HIGH (ref 70–99)
Glucose-Capillary: 225 mg/dL — ABNORMAL HIGH (ref 70–99)
Glucose-Capillary: 131 mg/dL — ABNORMAL HIGH (ref 70–99)
Glucose-Capillary: 114 mg/dL — ABNORMAL HIGH (ref 70–99)

## 2021-11-29 LAB — BASIC METABOLIC PANEL
Anion gap: 13 (ref 5–15)
BUN: 112 mg/dL — ABNORMAL HIGH (ref 8–23)
CO2: 24 mmol/L (ref 22–32)
Calcium: 8.4 mg/dL — ABNORMAL LOW (ref 8.9–10.3)
Chloride: 95 mmol/L — ABNORMAL LOW (ref 98–111)
Creatinine, Ser: 2.86 mg/dL — ABNORMAL HIGH (ref 0.61–1.24)
GFR, Estimated: 22 mL/min — ABNORMAL LOW (ref >60)
Glucose, Bld: 119 mg/dL — ABNORMAL HIGH (ref 70–99)
Potassium: 4.2 mmol/L (ref 3.5–5.1)
Sodium: 132 mmol/L — ABNORMAL LOW (ref 135–145)

## 2021-11-29 LAB — MAGNESIUM: Magnesium: 2.7 mg/dL — ABNORMAL HIGH (ref 1.7–2.4)

## 2021-11-29 NOTE — Progress Notes (Signed)
Occupational Therapy Treatment Patient Details Name: Glenn [name] MRN: [id number] DOB: September 13, 1940 Today's Date: 11/29/2021   History of present illness Patient is a 81 y/o male who presents on 10/29/2021 with generalized weakness and falls. Workup for septic shock secondary to urosepsis with hydronephrosis, AKI, spine abscess (L4-5 discitis/osteomyelitis), L psoas abscess. S/p L2-L3 disc aspiration 6/8; cultures with klebsiella. Prolonged hospitalization due to anasarca not responding to diuresis. S/p RHC 6/23 with normal L sided filling pressures, elevated RAP, moderate pulmonary HTN. PMH includes DM, HTN, MI, CAD s/p CABG, recent PPM placement and DCCV for Afib, CHF, CKD, BPH.   OT comments  Patient appears to be transitioning to comfort care.  OT order still in place, but after talking to the patient, he is not interested in perusing continued therapy interventions.  OT assisted with bedlevel grooming and repositioning in bed for comfort.  OT POC discontinued, and order discharged.      Recommendations for follow up therapy are one component of a multi-disciplinary discharge planning process, led by the attending physician.  Recommendations may be updated based on patient status, additional functional criteria and insurance authorization.    Follow Up Recommendations  Follow physician's recommendations for discharge plan and follow up therapies    Assistance Recommended at Discharge Frequent or constant Supervision/Assistance  Patient can return home with the following  A lot of help with walking and/or transfers;A lot of help with bathing/dressing/bathroom;Direct supervision/assist for medications management   Equipment Recommendations  None recommended by OT    Recommendations for Other Services      Precautions / Restrictions Precautions Precautions: Fall Restrictions Weight Bearing Restrictions: No Other Position/Activity Restrictions: patient transitioning to comfort care        Mobility Bed Mobility                    Transfers                   General transfer comment: Mod A to scoot higher in bed, discussed positioning changes     Balance                                           ADL either performed or assessed with clinical judgement   ADL       Grooming: Set up;Wash/dry hands;Wash/dry face;Bed level                                      Extremity/Trunk Assessment Upper Extremity Assessment Upper Extremity Assessment: Generalized weakness                              Cognition Arousal/Alertness: Awake/alert Behavior During Therapy: WFL for tasks assessed/performed Overall Cognitive Status: Within Functional Limits for tasks assessed                                                             Pertinent Vitals/ Pain       Pain Assessment Pain Assessment: No/denies pain  Frequency           Progress Toward Goals  OT Goals(current goals can now be found in the care plan section)  Progress towards OT goals: Not progressing toward goals - comment  Acute Rehab OT Goals Patient Stated Goal: I'm at peace OT Goal Formulation: With patient Time For Goal Achievement: 11/30/21 Potential to Achieve Goals: Fair  Plan Discharge plan needs to be updated;Other (comment) (Patient transitioning to comfort.)    Co-evaluation                 AM-PAC OT "6 Clicks" Daily Activity     Outcome Measure   Help from another person eating meals?: None Help from another person taking care of personal grooming?: A Little Help from another person toileting, which includes using toliet, bedpan, or urinal?: A Lot Help from another person bathing (including washing, rinsing, drying)?: A Lot Help from another person to put on and taking off regular upper body clothing?: A Lot Help  from another person to put on and taking off regular lower body clothing?: A Lot 6 Click Score: 15    End of Session    OT Visit Diagnosis: Adult, failure to thrive (R62.7)   Activity Tolerance Patient tolerated treatment well   Patient Left in bed;with call bell/phone within reach   Nurse Communication Other (comment) (discharge)        Time: 8937-3428 OT Time Calculation (min): 15 min  Charges: OT General Charges $OT Visit: 1 Visit OT Treatments $Therapeutic Activity: 8-22 mins  11/29/2021  RP, OTR/L  Acute Rehabilitation Services  Office:  [phone]   Suzanna Obey 11/29/2021, 9:52 AM

## 2021-11-29 NOTE — Progress Notes (Signed)
This chaplain responded to PMT consult for spiritual care.  The Pt. is awake, comfortable, and greeted the chaplain with a smile.   The chaplain listened reflectively as the Pt. described his "crippled body" and the clarity in the Pt. decision not to receive any more aggressive medical care. The Pt. shares he is updating his family and friends of his desire to pass peacefully and celebrate eternal life. The Pt. adds "I have my life in order and have no regrets."  The Pt. speaks confidently of the transfer to a different place in the hospital and the medical team's assurance he will pass comfortably.  The chaplain shared a blessing and invitation for F/U spiritual care with the Pt.  Chaplain Stephanie Acre [phone]

## 2021-11-29 NOTE — Progress Notes (Signed)
Progress Note  Patient Name: Glenn [name] Date of Encounter: 11/30/2021  [facility] Cardiologist: Elcho   Did not get much rest last night. Asking for a stronger sleeping aide. Denies chest pain or SOB.  RHC 11/25/21: RA = 12  RV = 65/11 PA = 62/18 (35) PCW = 13  Fick cardiac output/index = 5.4/2.8 Thermodilution CO/CI = 4.3/2.2 PVR = 4.5 WU FA sat = 97% PA sat = 49%, 54% PAPi = 3.7   Inpatient Medications    Scheduled Meds:  (feeding supplement) PROSource Plus  30 mL Oral TID BM   apixaban  5 mg Oral BID   artificial tears   Both Eyes QHS   vitamin C  500 mg Oral BID   atorvastatin  80 mg Oral QHS   chlorhexidine  15 mL Mouth Rinse BID   Chlorhexidine Gluconate Cloth  6 each Topical Q0600   finasteride  5 mg Oral Daily   insulin aspart  0-15 Units Subcutaneous TID WC   insulin aspart  0-5 Units Subcutaneous QHS   insulin detemir  15 Units Subcutaneous Daily   ketotifen  1 drop Both Eyes BID   latanoprost  1 drop Both Eyes QHS   mouth rinse  15 mL Mouth Rinse q12n4p   multivitamin with minerals  1 tablet Oral Daily   pantoprazole  40 mg Oral Q1200   polyethylene glycol  17 g Oral BID   sodium chloride flush  10-40 mL Intracatheter Q12H   sodium chloride flush  10-40 mL Intracatheter Q12H   tamsulosin  0.8 mg Oral QHS   zinc sulfate  220 mg Oral Daily   Continuous Infusions:  sodium chloride 250 mL (11/02/21 0205)   sodium chloride 10 mL/hr at 11/27/21 0512    ceFAZolin (ANCEF) IV 2 g (11/29/21 2222)   DOBUTamine 2.5 mcg/kg/min (11/29/21 1338)   PRN Meds: acetaminophen, hydrALAZINE, ipratropium-albuterol, ondansetron (ZOFRAN) IV, oxyCODONE, polyvinyl alcohol, senna-docusate, traZODone   Vital Signs    Vitals:   11/29/21 1942 11/29/21 2346 11/30/21 0400 11/30/21 0730  BP: 104/60 123/65 (!) 92/55 (!) 103/57  Pulse: (!) 102 (!) 103 (!) 105 (!) 102  Resp: 20 20 20 18   Temp: 98.9 F (37.2 C) 98.6 F (37 C) 98.7 F (37.1 C)  98.5 F (36.9 C)  TempSrc: Oral Oral Oral Oral  SpO2: 97% 98% 94% 96%  Weight:      Height:        Intake/Output Summary (Last 24 hours) at 11/30/2021 1005 Last data filed at 11/30/2021 [id number] Gross per 24 hour  Intake 369.5 ml  Output 800 ml  Net -430.5 ml      11/29/2021    3:19 AM 11/28/2021    6:30 AM 11/27/2021    7:05 AM  Last 3 Weights  Weight (lbs) 161 lb 2.5 oz 152 lb 1.9 oz 166 lb 7.2 oz  Weight (kg) 73.1 kg 69 kg 75.5 kg      Telemetry    A-sensed, v-paced- Personally Reviewed  ECG    No new tracing today - Personally Reviewed  Physical Exam   GEN: Elderly, frail appearing HEENT: Normal NECK: JVD to the angle of the mandible CARDIAC: RRR, 3/6 systolic murmur RESPIRATORY: Diminished at the bases ABDOMEN: Soft, non-tender, non-distended MUSCULOSKELETAL: 2+ bilateral edema, warm SKIN: Warm and dry NEUROLOGIC:  Alert and oriented x 3 PSYCHIATRIC:  Normal affect   Labs    High Sensitivity Troponin:   Recent Labs  Lab 11/02/21 0119  11/02/21 0347  TROPONINIHS 83* 83*     Chemistry Recent Labs  Lab 11/26/21 0126 11/27/21 0500 11/28/21 0405 11/29/21 0330  NA 129* 129* 132* 132*  K 4.9 4.1 4.5 4.2  CL 94* 96* 98 95*  CO2 24 23 25 24   GLUCOSE 110* 75 77 119*  BUN 124* 118* 114* 112*  CREATININE 3.01* 2.75* 2.79* 2.86*  CALCIUM 8.1* 7.9* 8.4* 8.4*  MG 2.7*  --  2.8* 2.7*  PROT 6.7  --   --   --   ALBUMIN 2.6*  --   --   --   AST 28  --   --   --   ALT <5  --   --   --   ALKPHOS 211*  --   --   --   BILITOT 0.3  --   --   --   GFRNONAA 21* 23* 22* 22*  ANIONGAP 11 10 9 13     Lipids No results for input(s): "CHOL", "TRIG", "HDL", "LABVLDL", "LDLCALC", "CHOLHDL" in the last 168 hours.  Hematology Recent Labs  Lab 11/27/21 0500 11/28/21 0405 11/29/21 0330  WBC 7.5 8.0 9.5  RBC 2.95* 3.00* 2.93*  HGB 8.1* 8.0* 8.0*  HCT 24.5* 25.0* 24.5*  MCV 83.1 83.3 83.6  MCH 27.5 26.7 27.3  MCHC 33.1 32.0 32.7  RDW 17.3* 17.4* 17.8*  PLT 330 332 322    Thyroid No results for input(s): "TSH", "FREET4" in the last 168 hours.  BNP No results for input(s): "BNP", "PROBNP" in the last 168 hours.   DDimer No results for input(s): "DDIMER" in the last 168 hours.   Radiology    No results found.  Cardiac Studies  RHC 11/06/2021: Findings:   RA = 12  RV = 65/11 PA = 62/18 (35) PCW = 13  Fick cardiac output/index = 5.4/2.8 Thermodilution CO/CI = 4.3/2.2 PVR = 4.5 WU FA sat = 97% PA sat = 49%, 54% PAPi = 3.7   Assessment: 1. Normal left sided filling pressures  2. 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 Mildly decreased cardiac output   Plan/Discussion:   He seems to have R>L HF. Hold diuretics for now. Co-ox on the low end. Can follow co-ox via PICC and adjust meds as needed     Echocardiogram 11/02/2021: Impressions: 1. There are some endomyocardial trabeculations noted but does not meet  criteria for left ventricular non-compaction. Left ventricular ejection  fraction, by estimation, is <20%. The left ventricle has severely  decreased function. The left ventricle has  no regional wall motion abnormalities. Left ventricular diastolic  parameters are consistent with Grade II diastolic dysfunction  (pseudonormalization). There is the interventricular septum is flattened  in systole and diastole, consistent with right  ventricular pressure and volume overload.   2. Right ventricular systolic function is normal. The right ventricular  size is normal.   3. The mitral valve is normal in structure. Mild mitral valve  regurgitation. No evidence of mitral stenosis.   4. Tricuspid valve regurgitation is moderate.   5. The aortic valve is tricuspid. Aortic valve regurgitation is not  visualized. No aortic stenosis is present.   6. There is borderline dilatation of the aortic root, measuring 37 mm.   7. The inferior vena cava is normal in size with greater than 50%  respiratory variability, suggesting right atrial  pressure of 3 mmHg.   Comparison(s): No prior Echocardiogram.  Patient Profile     81 y.o. 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 He was transferred to [facility] where he was admitted to medical ICU. He underwent disc aspiration on 6/8 by IR which grew Klebsiella. He was treated with IV fluids per sepsis protocol and then went into acute CHF. He was started on IV Lasix but remained significant volume overloaded. Therefore, Cardiology was consulted for assistance on 11/20/2021.   Assessment & Plan    #Acute on Chronic Combined CHF: -TTE 11/02/21 with LVEF of <20% with no regional wall motion abnormalities and grade 2 diastolic dysfunction. RV normal but interventricular septum is flattened in systolic and diastole consistent with RV pressure and volume overload -RHC with normal left sided filling pressures with elevated RAP and moderate pulmonary HTN -Will plan to continue dobutamine until family arrives by the end of the week and then transition to palliative measures  -Patient is not a candidate for advanced therapies or home inotropic agents  #CAD #Elevated Troponin - S/p remote CABG in 1998. High-sensitivity troponin minimally elevated and flat at 83 >> 83.  - No chest pain with low suspicion for ACS - No aspirin due to need for anticoagulation - Continue high-intensity statin - Plan to transition to palliative measures once family arrives  #Paroxysmal Atrial Fibrillation -Patient reports recently being diagnosed with atrial fibrillation s/p DCCV about 5 weeks ago - Telemetry shows A sensed V pacing with runs of NSVT   - Home Coreg on hold due to need for inotropic support - Continue Eliquis 5mg  BID - Will ultimately transition to palliative once family arrives  #S/p ICD -Placed for primary prevention for sudden cardiac death given CHF. Device was interrogated on 11/02/2021 and showed no sustained VT in detection zone - Followed at the 01/02/2022 - Again, will likely turn-off once family arrives due to plan to transition to palliative measures  #Hypertension - Controlled off BP meds  #Hyperlipidemia - Continue Lipitor 80mg  daily.  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 He would like his daughter to arrive from vacation before switching to comfort focused care. Will continue dobutamine until that time. Can stop lab draws, vitals in the middle of the night, PT. Appreciate palliative care support. Patient is not a candidate for advanced therapies or home inotropes.  Otherwise, per primary team. - Septic shock - resolved - Bacteremia due to Klebsiella pneumoniae  - Osteomyelitis of lumbar spine - Discitis  - UTI - Bladder outlet obstruction - Type 2 diabetes mellitus - Moderate malnutrition - Deconditioning  For questions or updates, please contact [facility] Please consult [email or web address] for contact info under     Signed, [place], MD  11/30/2021, 10:05 AM

## 2021-11-29 NOTE — Progress Notes (Signed)
Physical Therapy Treatment Patient Details Name: Glenn [name] MRN: [id number] DOB: 09-12-1940 Today's Date: 11/29/2021   History of Present Illness Patient is a 81 y/o male who presents on 10/14/2021 with generalized weakness and falls. Workup for septic shock secondary to urosepsis with hydronephrosis, AKI, spine abscess (L4-5 discitis/osteomyelitis), L psoas abscess. S/p L2-L3 disc aspiration 6/8; cultures with klebsiella. Prolonged hospitalization due to anasarca not responding to diuresis. S/p RHC 6/23 with normal L sided filling pressures, elevated RAP, moderate pulmonary HTN. PMH includes DM, HTN, MI, CAD s/p CABG, recent PPM placement and DCCV for Afib, CHF, CKD, BPH.    PT Comments    Pt received supine and requesting no other therapy interventions at this time, anticipate pt transitioning to comfort care. Extended time spent discussing/educating pt on purpose of PT and benefits throughout all stages of life pt verbalizing understanding and showing gratitude however politely declining all mobility and all further atempts at mobility, encouraged pt OOB to chair and offering assist with ADLs pt continues to decline. PT to sign off at this time.    Recommendations for follow up therapy are one component of a multi-disciplinary discharge planning process, led by the attending physician.  Recommendations may be updated based on patient status, additional functional criteria and insurance authorization.  Follow Up Recommendations  Acute inpatient rehab (3hours/day)     Assistance Recommended at Discharge Frequent or constant Supervision/Assistance  Patient can return home with the following Assistance with cooking/housework;Assist for transportation;Help with stairs or ramp for entrance;A lot of help with walking and/or transfers;A lot of help with bathing/dressing/bathroom   Equipment Recommendations  None recommended by PT    Recommendations for Other Services       Precautions /  Restrictions Precautions Precautions: Fall Restrictions Weight Bearing Restrictions: No Other Position/Activity Restrictions: patient transitioning to comfort care     Mobility  Bed Mobility                    Transfers                        Ambulation/Gait                   Stairs             Wheelchair Mobility    Modified Rankin (Stroke Patients Only)       Balance                                            Cognition Arousal/Alertness: Awake/alert Behavior During Therapy: WFL for tasks assessed/performed Overall Cognitive Status: Within Functional Limits for tasks assessed                                 General Comments: pt stating he is not in pain and does not want to move to "bring the pain on", stating he is not interested in any OOB mobility or ADLs        Exercises      General Comments General comments (skin integrity, edema, etc.): extended time spent discussing purpose of PT with pt and benefits throughout all stages of life, pt polietly declining all mobility and all further atempts at mobility, encouraged pt OOB to chair to for assist with ADLs pt continues to decline.  Pertinent Vitals/Pain Pain Assessment Pain Assessment: No/denies pain Pain Intervention(s): Monitored during session    Home Living                          Prior Function            PT Goals (current goals can now be found in the care plan section) Acute Rehab PT Goals PT Goal Formulation: With patient Time For Goal Achievement: 12/05/21    Frequency    Min 3X/week      PT Plan Current plan remains appropriate    Co-evaluation              AM-PAC PT "6 Clicks" Mobility   Outcome Measure  Help needed turning from your back to your side while in a flat bed without using bedrails?: A Little Help needed moving from lying on your back to sitting on the side of a flat bed  without using bedrails?: A Little Help needed moving to and from a bed to a chair (including a wheelchair)?: A Lot Help needed standing up from a chair using your arms (e.g., wheelchair or bedside chair)?: A Lot Help needed to walk in hospital room?: A Little Help needed climbing 3-5 steps with a railing? : Total 6 Click Score: 14    End of Session   Activity Tolerance: Patient limited by fatigue Patient left: in bed;with call bell/phone within reach;with family/visitor present Nurse Communication: Mobility status PT Visit Diagnosis: Unsteadiness on feet (R26.81);Other abnormalities of gait and mobility (R26.89);Muscle weakness (generalized) (M62.81);Pain     Time: 0626-9485 PT Time Calculation (min) (ACUTE ONLY): 12 min  Charges:  $Therapeutic Activity: 8-22 mins                     Daiden Coltrane R. PTA Acute Rehabilitation Services Office: [phone]    Catalina Antigua 11/29/2021, 3:02 PM

## 2021-11-29 NOTE — Progress Notes (Signed)
Inpatient Rehab Admissions Coordinator:   Note likely transition to comfort care by the end of this week.  Pt requesting no further therapy interventions at this time per OT note.  CIR order d/c'd.  Please let us know if we can be of further assistance.   Estill Dooms, PT, DPT Admissions Coordinator [phone] 11/29/21  10:53 AM

## 2021-11-29 NOTE — Progress Notes (Signed)
Palliative:  HPI: 81 y.o. male  with past medical history of CHF, s/p Environmental manager PPM/ICD, CAD, IDDM, CKD stage 3b, prostatomegaly admitted on 10/13/2021 with weakness x 3 days after fall followed with pain, difficulty to ambulate, decrease intake and urine output. Noted to have hypotension, leukocytosis related to discitis aspiration showing +Klebsiella along with UTI. Hospitalization complicated by cardiogenic shock EF <20%, grade 2 diastolic dysfunction, moderate pulmonary hypertension, renal failure, bladder outlet obstruction, paroxysmal atrial fibrillation. Ongoing significant fluid overload and poor response to diuresis. Trial large dose of Lasix while on dobutamine.   I met again today with Glenn [name] along with his daughter and wife at bedside. [name] continues to speak to his poor prognosis and is able to tell his family that he is very much at peace. He reviews that his vital signs have been good but he understands that his heart is "give out" - he says that his heart has served him well for many good years. He is a very spiritual man and reports that he had wonderful visits with chaplain Alvino Chapel. He is at peace and is prepared for transition to comfort care when his other daughter returns from [place]. Family at bedside report that they believed she would be back Thurs/Fri but she will not return until Monday July 3rd. [name] was disheartened that she does not return until Monday but they all continue to report their goal to continue to maintain until she returns and then we will transition to full comfort care and allow natural and peaceful death.   I spoke with them again about my recommendation for deactivation of AICD and they all agree that this would be in his best interest at this time. I explained the process and will place order for [facility] [employer] to come to deactivate.   All questions/concerns addressed. Emotional support provided.   Exam: Alert, oriented. Frail.  Lying in bed. No distress. Generalized weakness and fatigue. HR 90-100s. Breathing regular, unlabored at rest. Abd soft. BLE edema.   Plan: - DNR - Deactivate AICD - Awaiting daughter's return from vacation to say goodbye and then will transition to full comfort care (she should be back Monday)  40 min  Yong Channel, NP Palliative Medicine Team Pager [phone] (Please see [email or web address] for schedule) Team Phone [phone]    Greater than 50%  of this time was spent counseling and coordinating care related to the above assessment and plan

## 2021-11-29 NOTE — Inpatient Diabetes Management (Signed)
Inpatient Diabetes Program Recommendations  AACE/ADA: New Consensus Statement on Inpatient Glycemic Control (2015)  Target Ranges:  Prepandial:   less than 140 mg/dL      Peak postprandial:   less than 180 mg/dL (1-2 hours)      Critically ill patients:  140 - 180 mg/dL   Lab Results  Component Value Date   GLUCAP 133 (H) 11/29/2021   HGBA1C 10.6 (H) 11/02/2021    Review of Glycemic Control  Latest Reference Range & Units 11/28/21 11:16 11/28/21 16:26 11/28/21 17:07 11/28/21 17:55 11/28/21 21:21 11/29/21 05:59  Glucose-Capillary 70 - 99 mg/dL 026 (H) 60 (L) 67 (L) 71 87 133 (H)  (H): Data is abnormally high (L): Data is abnormally low   Current orders for Inpatient glycemic control: Levemir 15 units, Novolog 0-15 units TID and 0-5 QHS  Inpatient Diabetes Program Recommendations:    If appropriate, please consider:  Novolog 0-9 units TID and 0-5 units QHS  Will continue to follow while inpatient.  Thank you, Dulce Sellar, MSN, CDCES Diabetes Coordinator Inpatient Diabetes Program [phone] (team pager from 8a-5p)

## 2021-11-29 NOTE — Progress Notes (Signed)
PROGRESS NOTE  Glenn [name]  DJM:[id number] DOB: 1941/03/04 DOA: 2021-11-29 PCP: System, Provider Not In   Brief Narrative:  Patient is a 81 y.o. 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  Patient does intermittent catheterization at home whenever he feels like doing usually every 48 hours.  On presentation he was hyportensive.  Lab work showed leukocytosis.  Creatinine was in the range of 3.6.  CT imaging showed distended bladder, discitis/osteomyelitis of the spine.  He was transferred to [facility], ICU for pressures.  Foley was placed for urinary retention.  MRI did not show any drainable focus, pressure started off, transferred out of ICU.  Underwent disc aspiration on 6/8 by IR, cultures showed Klebsiella.  ID was consulted and recommended antibiotics.  PT/OT recommended CIR on discharge.  Because of severe volume overload, IV Lasix was started.  Cardiology also consulted  for resistant anasarca.  Prolonged hospitalization due to anasarca not responding to diuresis.  Now palliative care following for goals of care, plan is to convert his care to comfort if no further improvement.  Assessment & Plan:  Principal Problem:   Septic shock (HCC) Active Problems:   Bacteremia due to Klebsiella pneumoniae   Discitis   Osteomyelitis of lumbar spine (HCC)   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HCC)   Bladder outlet obstruction   Acute kidney injury (HCC)   Uncontrolled type 2 diabetes mellitus with hyperglycemia, with long-term current use of insulin (HCC)   Pacemaker   Chronic kidney disease, stage 3b (HCC)   Coronary artery disease involving native coronary artery of native heart without angina pectoris   Chronic combined systolic and diastolic CHF (congestive heart failure) (HCC)   Hyponatremia   Hypokalemia   Transaminitis   Myocardial injury   Thrombocytopenia (HCC)   Anemia due to chronic  kidney disease   Coagulopathy (HCC)   Protein-calorie malnutrition, moderate (HCC)   Paroxysmal atrial fibrillation (HCC)   Foot ulcer (HCC)  Chronic combined systolic/diastolic congestive heart failure/volume overload: Known history of systolic congestive heart failure.  EF is less than 20%, has grade 2 diastolic dysfunction.  Status post ICD placement.  Looks severely volume overloaded  along with bilateral lower extremity edema, scrotal edema.patient was on IV Lasix.  Hypoalbuminemia is also contributing. Takes torsemide at home.  He follows with cardiology.  Also takes Entresto at home which is on hold.   On compression stockings for his bilateral lower extremity edema. Right heart cath with finding of normal left-sided pressures with elevated RAP, moderate pulmonary hypertension.  Started on dobutamine drip.  Septic shock/Klebsiella bacteremia/UTI: Presented to [facility] initially with back pain, weakness.  Found to be very hypotensive.  Septic shock suspected.  Started on broad-spectrum antibiotics.  Sent to ICU at [facility] with pressor support.  Currently hemodynamically stable Found to have osteomyelitis/discitis of lumbar spine, psoas abscess.  Underwent disc space drainage by IR.  Culture resulted Klebsiella.  ID was consulted.  PICC line placed.  Recommended cefazolin till 7/18.  Sepsis physiology has resolved.  Leukocytosis resolved.  AKI on CKD stage IIIb: Baseline creatinine arounfd 1.8.  Presented with creatinine in the range of 3.6.  Kidney function fluctuating.  Monitor  Coronary artery disease: No anginal symptoms.  On Lipitor, beta-blocker at home now on hold  Paroxysmal A-fib: Currently rate is controlled.  On Eliquis for anticoagulation.  Monitor on telemetry.  Was on Coreg for rate control, currently on hold due to hypotension  Bladder outlet obstruction: On presentation, imaging showed distended bladder.  Takes finasteride, Flomax at home.  Does self cath at home.  Foley was placed  here.    Then will check voiding trial.  Type 2 diabetes/hyperglycemia: Last hemoglobin A1c of more than 10.  Continue current insulin regimen.  Monitor blood sugars  Normocytic anemia:   Hemoglobin was in the range of 11 few weeks ago.Now trended down to 7-8.  No evidence of acute blood loss.  He is on Eliquis.  Negative FOBT.  Denies any hematochezia or melena.  Continue to monitor  Moderate malnutrition: Nutritionist following  Deconditioning/debility: PT/OT recommended CIR on discharge.  Goals of care: Palliative care consulted due to persistent anasarca not responding to medications.  CODE STATUS is DNR.  Extensive discussion at the bedside again today.  He wants to be comfortable and wants t himself transitioned to comfort care if appropriate.    Nutrition Problem: Moderate Malnutrition Etiology: chronic illness (CHF) Pressure Injury 11/02/21 Foot Left;Lateral Deep Tissue Pressure Injury - Purple or maroon localized area of discolored intact skin or blood-filled blister due to damage of underlying soft tissue from pressure and/or shear. 5.5x4 (Active)  11/02/21   Location: Foot  Location Orientation: Left;Lateral  Staging: Deep Tissue Pressure Injury - Purple or maroon localized area of discolored intact skin or blood-filled blister due to damage of underlying soft tissue from pressure and/or shear.  Wound Description (Comments): 5.5x4  Present on Admission: Yes  Dressing Type Gauze (Comment) 11/29/21 0746    DVT prophylaxis:Place TED hose Start: 11/23/21 1011 Place and maintain sequential compression device Start: 11/02/21 1108 apixaban (ELIQUIS) tablet 5 mg     Code Status: DNR  Family Communication: None at bedside  Patient status:Inpatient  Patient is from :Home  Anticipated discharge to: Not sure.  May be transitioned to comfort care  Estimated DC date: Not sure     Consultants: PCCM, IR, ID,palliative care  Procedures: Dx aspiration,right heart  cath  Antimicrobials:  Anti-infectives (From admission, onward)    Start     Dose/Rate Route Frequency Ordered Stop   11/23/21 2200  ceFAZolin (ANCEF) IVPB 2g/100 mL premix        2 g 200 mL/hr over 30 Minutes Intravenous Every 12 hours 11/23/21 0918     11/22/21 1400  ceFAZolin (ANCEF) IVPB 2g/100 mL premix  Status:  Discontinued        2 g 200 mL/hr over 30 Minutes Intravenous Every 8 hours 11/22/21 0828 11/23/21 0918   11/20/21 2000  ceFAZolin (ANCEF) IVPB 2g/100 mL premix  Status:  Discontinued        2 g 200 mL/hr over 30 Minutes Intravenous Every 12 hours 11/20/21 1230 11/22/21 0828   11/20/21 1600  ceFAZolin (ANCEF) IVPB 2g/100 mL premix  Status:  Discontinued        2 g 200 mL/hr over 30 Minutes Intravenous Every 8 hours 11/20/21 1209 11/20/21 1230   11/12/21 1800  ceFAZolin (ANCEF) IVPB 2g/100 mL premix  Status:  Discontinued        2 g 200 mL/hr over 30 Minutes Intravenous Every 12 hours 11/12/21 0936 11/20/21 1209   11/04/21 1400  ceFAZolin (ANCEF) IVPB 2g/100 mL premix  Status:  Discontinued        2 g 200 mL/hr over 30 Minutes Intravenous Every 8 hours 11/04/21 1058 11/12/21 0936   11/03/21 1800  vancomycin (VANCOCIN) IVPB 1000 mg/200 mL  premix  Status:  Discontinued        1,000 mg 200 mL/hr over 60 Minutes Intravenous Every 48 hours 11/02/21 0259 11/02/21 0452   11/02/21 2200  cefTRIAXone (ROCEPHIN) 2 g in sodium chloride 0.9 % 100 mL IVPB  Status:  Discontinued        2 g 200 mL/hr over 30 Minutes Intravenous Every 24 hours 11/02/21 0452 11/04/21 1058   11/02/21 0230  ceFEPIme (MAXIPIME) 2 g in sodium chloride 0.9 % 100 mL IVPB  Status:  Discontinued        2 g 200 mL/hr over 30 Minutes Intravenous Every 24 hours 11/02/21 0131 11/02/21 0452       Subjective:  Patient seen and examined at the bedside this morning.  Hemodynamically stable.  Overall appears comfortable.  He had a good sleep last night.  Still has severe bilateral lower extremity edema, scrotal  swelling.  Goals of care again discussed with the patient.  He is open to be converted to comfort care  Objective: Vitals:   11/28/21 1927 11/28/21 2325 11/29/21 0319 11/29/21 0737  BP: 127/70 128/74 104/88 (!) 112/58  Pulse: (!) 104 (!) 102 (!) 102 (!) 103  Resp: 15 18 20 15   Temp: 98.6 F (37 C) 98.1 F (36.7 C) 98 F (36.7 C) 98 F (36.7 C)  TempSrc: Oral Oral Oral Oral  SpO2: 99% 98% 96% 96%  Weight:   73.1 kg   Height:        Intake/Output Summary (Last 24 hours) at 11/29/2021 0902 Last data filed at 11/29/2021 0600 Gross per 24 hour  Intake 382.12 ml  Output 2500 ml  Net -2117.88 ml   Filed Weights   11/27/21 0705 11/28/21 0630 11/29/21 0319  Weight: 75.5 kg 69 kg 73.1 kg    Examination:   General exam: Very deconditioned, weak HEENT: PERRL Respiratory system: Fine crackles in bilateral bases Cardiovascular system: S1 & S2 heard, RRR.  Gastrointestinal system: Abdomen is nondistended, soft and nontender. Central nervous system: Alert and oriented Extremities: Severe bilateral lower extremity pitting edema, no clubbing ,no cyanosis Skin: No rashes, no ulcers,no icterus   GU: Foley, scrotal edema   Data Reviewed: I have personally reviewed following labs and imaging studies  CBC: Recent Labs  Lab 11/25/21 0401 11/26/21 0126 11/27/21 0500 11/28/21 0405 11/29/21 0330  WBC 7.3 7.8 7.5 8.0 9.5  NEUTROABS  --  5.0  --   --   --   HGB 8.0* 8.1* 8.1* 8.0* 8.0*  HCT 24.4* 24.9* 24.5* 25.0* 24.5*  MCV 84.4 83.0 83.1 83.3 83.6  PLT 288 332 330 332 [id number]   Basic Metabolic Panel: Recent Labs  Lab 11/25/21 0401 11/26/21 0126 11/27/21 0500 11/28/21 0405 11/29/21 0330  NA 131* 129* 129* 132* 132*  K 4.9 4.9 4.1 4.5 4.2  CL 97* 94* 96* 98 95*  CO2 25 24 23 25 24   GLUCOSE 129* 110* 75 77 119*  BUN 113* 124* 118* 114* 112*  CREATININE 2.65* 3.01* 2.75* 2.79* 2.86*  CALCIUM 8.3* 8.1* 7.9* 8.4* 8.4*  MG  --  2.7*  --  2.8* 2.7*     No results found for  this or any previous visit (from the past 240 hour(s)).    Radiology Studies: No results found.  Scheduled Meds:  (feeding supplement) PROSource Plus  30 mL Oral TID BM   apixaban  5 mg Oral BID   artificial tears   Both Eyes QHS   vitamin C  500 mg Oral BID   atorvastatin  80 mg Oral QHS   chlorhexidine  15 mL Mouth Rinse BID   Chlorhexidine Gluconate Cloth  6 each Topical Q0600   finasteride  5 mg Oral Daily   insulin aspart  0-15 Units Subcutaneous TID WC   insulin aspart  0-5 Units Subcutaneous QHS   insulin detemir  15 Units Subcutaneous Daily   ketotifen  1 drop Both Eyes BID   latanoprost  1 drop Both Eyes QHS   mouth rinse  15 mL Mouth Rinse q12n4p   multivitamin with minerals  1 tablet Oral Daily   pantoprazole  40 mg Oral Q1200   polyethylene glycol  17 g Oral BID   sodium chloride flush  10-40 mL Intracatheter Q12H   sodium chloride flush  10-40 mL Intracatheter Q12H   tamsulosin  0.8 mg Oral QHS   zinc sulfate  220 mg Oral Daily   Continuous Infusions:  sodium chloride 250 mL (11/02/21 0205)   sodium chloride 10 mL/hr at 11/27/21 0512    ceFAZolin (ANCEF) IV 2 g (11/28/21 2128)   DOBUTamine 2.5 mcg/kg/min (11/29/21 0319)     LOS: 28 days   Shelly Coss, MD [facility] P6/28/2023, 9:02 AM

## 2021-11-30 DIAGNOSIS — Z7189 Other specified counseling: Secondary | ICD-10-CM | POA: Diagnosis not present

## 2021-11-30 DIAGNOSIS — I5042 Chronic combined systolic (congestive) and diastolic (congestive) heart failure: Secondary | ICD-10-CM | POA: Diagnosis not present

## 2021-11-30 DIAGNOSIS — I2510 Atherosclerotic heart disease of native coronary artery without angina pectoris: Secondary | ICD-10-CM | POA: Diagnosis not present

## 2021-11-30 DIAGNOSIS — N1832 Chronic kidney disease, stage 3b: Secondary | ICD-10-CM | POA: Diagnosis not present

## 2021-11-30 DIAGNOSIS — Z515 Encounter for palliative care: Secondary | ICD-10-CM | POA: Diagnosis not present

## 2021-11-30 DIAGNOSIS — I480 Paroxysmal atrial fibrillation: Secondary | ICD-10-CM | POA: Diagnosis not present

## 2021-11-30 LAB — GLUCOSE, CAPILLARY
Glucose-Capillary: 191 mg/dL — ABNORMAL HIGH (ref 70–99)
Glucose-Capillary: 226 mg/dL — ABNORMAL HIGH (ref 70–99)
Glucose-Capillary: 156 mg/dL — ABNORMAL HIGH (ref 70–99)
Glucose-Capillary: 117 mg/dL — ABNORMAL HIGH (ref 70–99)

## 2021-11-30 MED ORDER — ZOLPIDEM TARTRATE 5 MG PO TABS
5.0000 mg | ORAL_TABLET | Freq: Every day | ORAL | Status: DC
Start: 2021-11-30 — End: 2021-12-05
  Administered 2021-11-30 – 2021-12-04 (×5): 5 mg via ORAL
  Filled 2021-11-30 (×5): qty 1

## 2021-11-30 NOTE — Progress Notes (Signed)
PROGRESS NOTE  Glenn [name]  ERX:[id number] DOB: 1940-08-31 DOA: 10/09/2021 PCP: System, Provider Not In   Brief Narrative:  Patient is a 81 y.o. 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  Patient does intermittent catheterization at home whenever he feels like doing usually every 48 hours.  On presentation he was hyportensive.  Lab work showed leukocytosis.  Creatinine was in the range of 3.6.  CT imaging showed distended bladder, discitis/osteomyelitis of the spine.  He was transferred to [facility], ICU for pressures.  Foley was placed for urinary retention.  MRI did not show any drainable focus, pressure started off, transferred out of ICU.  Underwent disc aspiration on 6/8 by IR, cultures showed Klebsiella.  ID was consulted and recommended antibiotics.  PT/OT recommended CIR on discharge.  Because of severe volume overload, IV Lasix was started.  Cardiology also consulted  for resistant anasarca.  Prolonged hospitalization due to anasarca not responding to diuresis.  Now palliative care following for goals of care, plan is to convert his care to comfort after daughter arrives( likely on Monday)  Assessment & Plan:  Principal Problem:   Septic shock (HCC) Active Problems:   Bacteremia due to Klebsiella pneumoniae   Discitis   Osteomyelitis of lumbar spine (HCC)   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HCC)   Bladder outlet obstruction   Acute kidney injury (HCC)   Uncontrolled type 2 diabetes mellitus with hyperglycemia, with long-term current use of insulin (HCC)   Pacemaker   Chronic kidney disease, stage 3b (HCC)   Coronary artery disease involving native coronary artery of native heart without angina pectoris   Chronic combined systolic and diastolic CHF (congestive heart failure) (HCC)   Hyponatremia   Hypokalemia   Transaminitis   Myocardial injury   Thrombocytopenia (HCC)   Anemia  due to chronic kidney disease   Coagulopathy (HCC)   Protein-calorie malnutrition, moderate (HCC)   Paroxysmal atrial fibrillation (HCC)   Foot ulcer (HCC)  Chronic combined systolic/diastolic congestive heart failure/volume overload: Known history of systolic congestive heart failure.  EF is less than 20%, has grade 2 diastolic dysfunction.  Status post ICD placement.  Looks severely volume overloaded  along with bilateral lower extremity edema, scrotal edema.patient was on IV Lasix.  Hypoalbuminemia is also contributing. Takes torsemide at home.  He follows with cardiology.  Also takes Entresto at home which is on hold.   On compression stockings for his bilateral lower extremity edema. Right heart cath with finding of normal left-sided pressures with elevated RAP, moderate pulmonary hypertension.  Started on dobutamine drip.  Septic shock/Klebsiella bacteremia/UTI: Presented to [facility] initially with back pain, weakness.  Found to be very hypotensive.  Septic shock suspected.  Started on broad-spectrum antibiotics.  Sent to ICU at [facility] with pressor support.  Currently hemodynamically stable Found to have osteomyelitis/discitis of lumbar spine, psoas abscess.  Underwent disc space drainage by IR.  Culture resulted Klebsiella.  ID was consulted.  PICC line placed.  Recommended cefazolin till 7/18.  Sepsis physiology has resolved.  Leukocytosis resolved.  AKI on CKD stage IIIb: Baseline creatinine arounfd 1.8.  Presented with creatinine in the range of 3.6.  Kidney function fluctuating.   Coronary artery disease: No anginal symptoms.  On Lipitor, beta-blocker at home now on hold  Paroxysmal A-fib: Currently rate is controlled.  On Eliquis for anticoagulation.  Monitor on telemetry.  Was on Coreg for rate control, currently on hold due to hypotension  Bladder outlet obstruction: On presentation, imaging showed distended bladder.  Takes finasteride, Flomax at home.  Does self cath at home.  Foley was  placed here.   .  Type 2 diabetes/hyperglycemia: Last hemoglobin A1c of more than 10.  Continue current insulin regimen.  Monitor blood sugars  Normocytic anemia:   Hemoglobin was in the range of 11 few weeks ago.Now trended down to 7-8.  No evidence of acute blood loss.  He is on Eliquis.  Negative FOBT.  Denies any hematochezia or melena.    Moderate malnutrition: Nutritionist following  Deconditioning/debility: PT/OT recommended CIR on discharge.  Goals of care: Palliative care consulted due to persistent anasarca not responding to medications.  CODE STATUS is DNR.  Extensive discussion at the bedside again today.  He wants to be comfortable and wants  himself transitioned to comfort care  but would like to meet her daughter before that.  She is in [place] and will be here on Monday    Nutrition Problem: Moderate Malnutrition Etiology: chronic illness (CHF) Pressure Injury 11/02/21 Foot Left;Lateral Deep Tissue Pressure Injury - Purple or maroon localized area of discolored intact skin or blood-filled blister due to damage of underlying soft tissue from pressure and/or shear. 5.5x4 (Active)  11/02/21   Location: Foot  Location Orientation: Left;Lateral  Staging: Deep Tissue Pressure Injury - Purple or maroon localized area of discolored intact skin or blood-filled blister due to damage of underlying soft tissue from pressure and/or shear.  Wound Description (Comments): 5.5x4  Present on Admission: Yes  Dressing Type Gauze (Comment) 11/29/21 1942    DVT prophylaxis:Place TED hose Start: 11/23/21 1011 Place and maintain sequential compression device Start: 11/02/21 1108 apixaban (ELIQUIS) tablet 5 mg     Code Status: DNR  Family Communication: None at bedside  Patient status:Inpatient  Patient is from :Home  Anticipated discharge to: Not sure  Estimated DC date: Not sure  .  Plan for transitioning his care to comfort.   Consultants: PCCM, IR, ID,palliative  care  Procedures: Dx aspiration,right heart cath  Antimicrobials:  Anti-infectives (From admission, onward)    Start     Dose/Rate Route Frequency Ordered Stop   11/23/21 2200  ceFAZolin (ANCEF) IVPB 2g/100 mL premix        2 g 200 mL/hr over 30 Minutes Intravenous Every 12 hours 11/23/21 0918     11/22/21 1400  ceFAZolin (ANCEF) IVPB 2g/100 mL premix  Status:  Discontinued        2 g 200 mL/hr over 30 Minutes Intravenous Every 8 hours 11/22/21 0828 11/23/21 0918   11/20/21 2000  ceFAZolin (ANCEF) IVPB 2g/100 mL premix  Status:  Discontinued        2 g 200 mL/hr over 30 Minutes Intravenous Every 12 hours 11/20/21 1230 11/22/21 0828   11/20/21 1600  ceFAZolin (ANCEF) IVPB 2g/100 mL premix  Status:  Discontinued        2 g 200 mL/hr over 30 Minutes Intravenous Every 8 hours 11/20/21 1209 11/20/21 1230   11/12/21 1800  ceFAZolin (ANCEF) IVPB 2g/100 mL premix  Status:  Discontinued        2 g 200 mL/hr over 30 Minutes Intravenous Every 12 hours 11/12/21 0936 11/20/21 1209   11/04/21 1400  ceFAZolin (ANCEF) IVPB 2g/100 mL premix  Status:  Discontinued        2 g 200 mL/hr over 30 Minutes Intravenous Every 8 hours 11/04/21 1058  11/12/21 0936   11/03/21 1800  vancomycin (VANCOCIN) IVPB 1000 mg/200 mL premix  Status:  Discontinued        1,000 mg 200 mL/hr over 60 Minutes Intravenous Every 48 hours 11/02/21 0259 11/02/21 0452   11/02/21 2200  cefTRIAXone (ROCEPHIN) 2 g in sodium chloride 0.9 % 100 mL IVPB  Status:  Discontinued        2 g 200 mL/hr over 30 Minutes Intravenous Every 24 hours 11/02/21 0452 11/04/21 1058   11/02/21 0230  ceFEPIme (MAXIPIME) 2 g in sodium chloride 0.9 % 100 mL IVPB  Status:  Discontinued        2 g 200 mL/hr over 30 Minutes Intravenous Every 24 hours 11/02/21 0131 11/02/21 0452       Subjective:  Patient seen and examined at the bedside this morning.  Hemodynamically stable.  Overall looks comfortable, very weak, cannot get out of bed.  On room air.   Goals of care again discussed.  He wants to wait for his daughter before being transitioned to comfort care.  Complains of insomnia.  Objective: Vitals:   11/29/21 2346 11/30/21 0400 11/30/21 0730 11/30/21 1124  BP: 123/65 (!) 92/55 (!) 103/57 108/64  Pulse: (!) 103 (!) 105 (!) 102 (!) 103  Resp: 20 20 18 20   Temp: 98.6 F (37 C) 98.7 F (37.1 C) 98.5 F (36.9 C) 98.6 F (37 C)  TempSrc: Oral Oral Oral   SpO2: 98% 94% 96% 98%  Weight:      Height:        Intake/Output Summary (Last 24 hours) at 11/30/2021 1134 Last data filed at 11/30/2021 0438 Gross per 24 hour  Intake 369.5 ml  Output 800 ml  Net -430.5 ml   Filed Weights   11/27/21 0705 11/28/21 0630 11/29/21 0319  Weight: 75.5 kg 69 kg 73.1 kg    Examination:   General exam: Very deconditioned, not in distress HEENT: PERRL Respiratory system: Mild basilar crackles Cardiovascular system: S1 & S2 heard, RRR.  ICD Gastrointestinal system: Abdomen is nondistended, soft and nontender. Central nervous system: Alert and oriented Extremities: Trace bilateral lower extremity edema, no clubbing ,no cyanosis Skin: No rashes, no ulcers,no icterus   GU: Foley, penile/scrotal edema    Data Reviewed: I have personally reviewed following labs and imaging studies  CBC: Recent Labs  Lab 11/25/21 0401 11/26/21 0126 11/27/21 0500 11/28/21 0405 11/29/21 0330  WBC 7.3 7.8 7.5 8.0 9.5  NEUTROABS  --  5.0  --   --   --   HGB 8.0* 8.1* 8.1* 8.0* 8.0*  HCT 24.4* 24.9* 24.5* 25.0* 24.5*  MCV 84.4 83.0 83.1 83.3 83.6  PLT 288 332 330 332 [id number]   Basic Metabolic Panel: Recent Labs  Lab 11/25/21 0401 11/26/21 0126 11/27/21 0500 11/28/21 0405 11/29/21 0330  NA 131* 129* 129* 132* 132*  K 4.9 4.9 4.1 4.5 4.2  CL 97* 94* 96* 98 95*  CO2 25 24 23 25 24   GLUCOSE 129* 110* 75 77 119*  BUN 113* 124* 118* 114* 112*  CREATININE 2.65* 3.01* 2.75* 2.79* 2.86*  CALCIUM 8.3* 8.1* 7.9* 8.4* 8.4*  MG  --  2.7*  --  2.8* 2.7*      No results found for this or any previous visit (from the past 240 hour(s)).    Radiology Studies: No results found.  Scheduled Meds:  (feeding supplement) PROSource Plus  30 mL Oral TID BM   apixaban  5 mg Oral BID  artificial tears   Both Eyes QHS   vitamin C  500 mg Oral BID   atorvastatin  80 mg Oral QHS   chlorhexidine  15 mL Mouth Rinse BID   Chlorhexidine Gluconate Cloth  6 each Topical Q0600   finasteride  5 mg Oral Daily   insulin aspart  0-15 Units Subcutaneous TID WC   insulin aspart  0-5 Units Subcutaneous QHS   insulin detemir  15 Units Subcutaneous Daily   ketotifen  1 drop Both Eyes BID   latanoprost  1 drop Both Eyes QHS   mouth rinse  15 mL Mouth Rinse q12n4p   multivitamin with minerals  1 tablet Oral Daily   pantoprazole  40 mg Oral Q1200   polyethylene glycol  17 g Oral BID   sodium chloride flush  10-40 mL Intracatheter Q12H   sodium chloride flush  10-40 mL Intracatheter Q12H   tamsulosin  0.8 mg Oral QHS   zinc sulfate  220 mg Oral Daily   Continuous Infusions:  sodium chloride 250 mL (11/02/21 0205)   sodium chloride 10 mL/hr at 11/27/21 0512    ceFAZolin (ANCEF) IV 2 g (11/30/21 1022)   DOBUTamine 2.5 mcg/kg/min (11/29/21 1338)     LOS: 29 days   Burnadette Pop, MD [facility] P6/29/2023, 11:34 AM

## 2021-11-30 NOTE — Progress Notes (Addendum)
This RN connected with [employer], Joey regarding turning off AICD. Will follow up to ensure complete.   Notified by RN at 10:20 that ICD was turned off by [employer] rep bedside. NP made aware.     Shelda Jakes, RN MSN Palliative Medicine Team  [phone]

## 2021-11-30 NOTE — Progress Notes (Signed)
Progress Note  Patient Name: Glenn [name] Date of Encounter: 12/01/2021  [facility] Cardiologist: [facility]  Subjective   Feels okay this morning. Got some rest overnight. Denies chest pain.  RHC 11/25/21: RA = 12  RV = 65/11 PA = 62/18 (35) PCW = 13  Fick cardiac output/index = 5.4/2.8 Thermodilution CO/CI = 4.3/2.2 PVR = 4.5 WU FA sat = 97% PA sat = 49%, 54% PAPi = 3.7   Inpatient Medications    Scheduled Meds:  (feeding supplement) PROSource Plus  30 mL Oral TID BM   apixaban  5 mg Oral BID   artificial tears   Both Eyes QHS   vitamin C  500 mg Oral BID   atorvastatin  80 mg Oral QHS   chlorhexidine  15 mL Mouth Rinse BID   Chlorhexidine Gluconate Cloth  6 each Topical Q0600   finasteride  5 mg Oral Daily   insulin aspart  0-15 Units Subcutaneous TID WC   insulin aspart  0-5 Units Subcutaneous QHS   insulin detemir  15 Units Subcutaneous Daily   ketotifen  1 drop Both Eyes BID   latanoprost  1 drop Both Eyes QHS   mouth rinse  15 mL Mouth Rinse q12n4p   multivitamin with minerals  1 tablet Oral Daily   pantoprazole  40 mg Oral Q1200   polyethylene glycol  17 g Oral BID   sodium chloride flush  10-40 mL Intracatheter Q12H   sodium chloride flush  10-40 mL Intracatheter Q12H   tamsulosin  0.8 mg Oral QHS   zinc sulfate  220 mg Oral Daily   zolpidem  5 mg Oral QHS   Continuous Infusions:  sodium chloride 250 mL (11/02/21 0205)   sodium chloride 10 mL/hr at 11/27/21 0512    ceFAZolin (ANCEF) IV 2 g (11/30/21 2140)   DOBUTamine 2.5 mcg/kg/min (11/29/21 1338)   PRN Meds: acetaminophen, hydrALAZINE, ipratropium-albuterol, ondansetron (ZOFRAN) IV, oxyCODONE, polyvinyl alcohol, senna-docusate   Vital Signs    Vitals:   11/30/21 1956 11/30/21 2320 12/01/21 0326 12/01/21 0716  BP: 98/68 114/60 108/85 99/78  Pulse: (!) 108 97 93 (!) 104  Resp: 20 17 18 20   Temp: 98.6 F (37 C) 98.4 F (36.9 C) 98.6 F (37 C) 98.6 F (37 C)  TempSrc: Oral  Oral Oral Oral  SpO2: 99% 97% 95% 95%  Weight:      Height:        Intake/Output Summary (Last 24 hours) at 12/01/2021 12/03/2021 Last data filed at 12/01/2021 0327 Gross per 24 hour  Intake --  Output 375 ml  Net -375 ml      11/29/2021    3:19 AM 11/28/2021    6:30 AM 11/27/2021    7:05 AM  Last 3 Weights  Weight (lbs) 161 lb 2.5 oz 152 lb 1.9 oz 166 lb 7.2 oz  Weight (kg) 73.1 kg 69 kg 75.5 kg      Telemetry    A-sensed, v-paced- Personally Reviewed  ECG    No new tracing - Personally Reviewed  Physical Exam   GEN: Elderly, frail appearing HEENT: Normal NECK: JVD to the angle of the mandible CARDIAC: RRR, 3/6 systolic murmur RESPIRATORY: Diminished at the bases ABDOMEN: Soft, non-tender, non-distended MUSCULOSKELETAL: 2+ bilateral edema, warm SKIN: Warm and dry NEUROLOGIC:  Alert and oriented x 3 PSYCHIATRIC:  Normal affect   Labs    High Sensitivity Troponin:   Recent Labs  Lab 11/02/21 0119 11/02/21 0347  TROPONINIHS 83* 83*  Chemistry Recent Labs  Lab 11/26/21 0126 11/27/21 0500 11/28/21 0405 11/29/21 0330  NA 129* 129* 132* 132*  K 4.9 4.1 4.5 4.2  CL 94* 96* 98 95*  CO2 24 23 25 24   GLUCOSE 110* 75 77 119*  BUN 124* 118* 114* 112*  CREATININE 3.01* 2.75* 2.79* 2.86*  CALCIUM 8.1* 7.9* 8.4* 8.4*  MG 2.7*  --  2.8* 2.7*  PROT 6.7  --   --   --   ALBUMIN 2.6*  --   --   --   AST 28  --   --   --   ALT <5  --   --   --   ALKPHOS 211*  --   --   --   BILITOT 0.3  --   --   --   GFRNONAA 21* 23* 22* 22*  ANIONGAP 11 10 9 13     Lipids No results for input(s): "CHOL", "TRIG", "HDL", "LABVLDL", "LDLCALC", "CHOLHDL" in the last 168 hours.  Hematology Recent Labs  Lab 11/27/21 0500 11/28/21 0405 11/29/21 0330  WBC 7.5 8.0 9.5  RBC 2.95* 3.00* 2.93*  HGB 8.1* 8.0* 8.0*  HCT 24.5* 25.0* 24.5*  MCV 83.1 83.3 83.6  MCH 27.5 26.7 27.3  MCHC 33.1 32.0 32.7  RDW 17.3* 17.4* 17.8*  PLT 330 332 322   Thyroid No results for input(s): "TSH",  "FREET4" in the last 168 hours.  BNP No results for input(s): "BNP", "PROBNP" in the last 168 hours.   DDimer No results for input(s): "DDIMER" in the last 168 hours.   Radiology    No results found.  Cardiac Studies  RHC 03-Dec-2021: Findings:   RA = 12  RV = 65/11 PA = 62/18 (35) PCW = 13  Fick cardiac output/index = 5.4/2.8 Thermodilution CO/CI = 4.3/2.2 PVR = 4.5 WU FA sat = 97% PA sat = 49%, 54% PAPi = 3.7   Assessment: 1. Normal left sided filling pressures  2. 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 Mildly decreased cardiac output   Plan/Discussion:   He seems to have R>L HF. Hold diuretics for now. Co-ox on the low end. Can follow co-ox via PICC and adjust meds as needed     Echocardiogram 11/02/2021: Impressions: 1. There are some endomyocardial trabeculations noted but does not meet  criteria for left ventricular non-compaction. Left ventricular ejection  fraction, by estimation, is <20%. The left ventricle has severely  decreased function. The left ventricle has  no regional wall motion abnormalities. Left ventricular diastolic  parameters are consistent with Grade II diastolic dysfunction  (pseudonormalization). There is the interventricular septum is flattened  in systole and diastole, consistent with right  ventricular pressure and volume overload.   2. Right ventricular systolic function is normal. The right ventricular  size is normal.   3. The mitral valve is normal in structure. Mild mitral valve  regurgitation. No evidence of mitral stenosis.   4. Tricuspid valve regurgitation is moderate.   5. The aortic valve is tricuspid. Aortic valve regurgitation is not  visualized. No aortic stenosis is present.   6. There is borderline dilatation of the aortic root, measuring 37 mm.   7. The inferior vena cava is normal in size with greater than 50%  respiratory variability, suggesting right atrial pressure of 3 mmHg.   Comparison(s): No prior  Echocardiogram.  Patient Profile     81 y.o. 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 He was transferred to [facility] where he was admitted to medical ICU. He underwent disc aspiration on 6/8 by IR which grew Klebsiella. He was treated with IV fluids per sepsis protocol and then went into acute CHF. He was started on IV Lasix but remained significant volume overloaded. Therefore, Cardiology was consulted for assistance on 11/20/2021.   Assessment & Plan    #Acute on Chronic Combined CHF: -TTE 11/02/21 with LVEF of <20% with no regional wall motion abnormalities and grade 2 diastolic dysfunction. RV normal but interventricular septum is flattened in systolic and diastole consistent with RV pressure and volume overload -RHC with normal left sided filling pressures with elevated RAP and moderate pulmonary HTN -Will plan to continue dobutamine until family arrives on Monday and then transition to comfort measures -Patient is not a candidate for advanced therapies or home inotropic agents  #CAD #Elevated Troponin - S/p remote CABG in 1998. High-sensitivity troponin minimally elevated and flat at 83 >> 83.  - No chest pain with low suspicion for ACS - Plan to transition to palliative measures once family arrives  #Paroxysmal Atrial Fibrillation -Patient reports recently being diagnosed with atrial fibrillation s/p DCCV 5 weeks prior to admission - Telemetry with A sensed, v paced rhythm - Home Coreg on hold due to need for inotropic support - Continue Eliquis 5mg  BID - Will ultimately transition to palliative once family  arrives  #S/p [place] for primary prevention for sudden cardiac death given CHF. Device was interrogated on 11/02/2021 and showed no sustained VT in detection zone - Will plan to deactivate once transitioned to palliative measures  #Hypertension - Controlled off BP meds  #Hyperlipidemia - Continue Lipitor 80mg  daily.  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 He would like his daughter to arrive from vacation before switching to comfort focused care. Will continue dobutamine until that time. Can stop lab draws, vitals in the middle of the night, PT. Appreciate palliative care support. Patient is not a candidate for advanced therapies or home inotropes.  For questions or updates, please contact [place] Please consult [email or web address] for contact info under     Signed, Freada Bergeron, MD  12/01/2021, 8:21 AM

## 2021-11-30 NOTE — Progress Notes (Signed)
Palliative:  HPI: 81 y.o. male  with past medical history of CHF, s/p [employer] PPM/ICD, CAD, IDDM, CKD stage 3b, prostatomegaly admitted on 10/30/2021 with weakness x 3 days after fall followed with pain, difficulty to ambulate, decrease intake and urine output. Noted to have hypotension, leukocytosis related to discitis aspiration showing +Klebsiella along with UTI. Hospitalization complicated by cardiogenic shock EF <20%, grade 2 diastolic dysfunction, moderate pulmonary hypertension, renal failure, bladder outlet obstruction, paroxysmal atrial fibrillation. Ongoing significant fluid overload and poor response to diuresis. Trial large dose of Lasix while on dobutamine.   I received call from daughter, Shaunna, who is asking about potential for dobutamine at home. Shaunna shares that she had a family member that had dobutamine at home for 2 years. I told Shaunna that I feel this is not a good option for her father and even with continuing dobutamine his prognosis is very, very limited. However, I will verify with cardiology if this could be an option for them. I followed up with Shaunna to let her know that this is not an option due to the severity of her father's heart failure. I explained that it has only helped maintain him here in the hospital because of close monitoring that helps us know when to provide him with high dose diuretics and without this close follow up it would not be beneficial to him. Shaunna expresses understanding and appreciation for checking.   I met again today with Mr. Birmingham. He is awake and alert. He continues to acknowledge his poor prognosis and his acceptance of end of life. He confirms his plan for transition to comfort care after he is able to visit with his daughter. He talks of end of life and his desire not to suffer. He talks of his funeral and some plans that he has made. He has no questions or concerns. He just wants to be able to sleep better and control his  symptoms until he is able to transition to full comfort care. He talks of not wanting to burden his family but also not wanting this quality of life.   Emotional support provided.   Exam: Alert, oriented. Frail. Lying in bed. No distress. Generalized weakness and fatigue. HR 90-100s. Breathing regular, unlabored at rest. Abd soft. BLE edema.    Plan: - DNR - Deactivated AICD - Awaiting daughter's return from vacation to say goodbye and then will transition to full comfort care (she should be back Monday)  50 min  Alicia Parker, NP Palliative Medicine Team Pager [phone] (Please see [email or web address] for schedule) Team Phone [phone]    Greater than 50%  of this time was spent counseling and coordinating care related to the above assessment and plan    

## 2021-12-01 DIAGNOSIS — E876 Hypokalemia: Secondary | ICD-10-CM | POA: Diagnosis not present

## 2021-12-01 DIAGNOSIS — I2510 Atherosclerotic heart disease of native coronary artery without angina pectoris: Secondary | ICD-10-CM | POA: Diagnosis not present

## 2021-12-01 DIAGNOSIS — N1832 Chronic kidney disease, stage 3b: Secondary | ICD-10-CM | POA: Diagnosis not present

## 2021-12-01 DIAGNOSIS — N179 Acute kidney failure, unspecified: Secondary | ICD-10-CM | POA: Diagnosis not present

## 2021-12-01 LAB — GLUCOSE, CAPILLARY
Glucose-Capillary: 154 mg/dL — ABNORMAL HIGH (ref 70–99)
Glucose-Capillary: 232 mg/dL — ABNORMAL HIGH (ref 70–99)
Glucose-Capillary: 143 mg/dL — ABNORMAL HIGH (ref 70–99)
Glucose-Capillary: 100 mg/dL — ABNORMAL HIGH (ref 70–99)

## 2021-12-01 NOTE — Progress Notes (Signed)
Nutrition Brief Note  Chart reviewed. Plan is for pt to transition to comfort care once pt is able to visit with his daughter (Monday). No further nutrition interventions planned at this time.  Please re-consult as needed.    Mertie Clause, MS, RD, LDN Inpatient Clinical Dietitian Please see AMiON for contact information.

## 2021-12-01 NOTE — Progress Notes (Addendum)
PROGRESS NOTE  Glenn [name]  [id number] DOB: May 15, 1941 DOA: 10/17/2021 PCP: System, Provider Not In   Brief Narrative:  Patient is a 81 y.o. M with CAD s/p CABG 96, ICM and sCHF EF <20%, DM, CKD IIIb baseline 1.7, pAF on Eliquis, hx PPM and BPH who presented with 3 days weakness, back pain to [facility]  Patient does intermittent catheterization at home whenever he feels like doing usually every 48 hours.  On presentation he was hyportensive.  Lab work showed leukocytosis.  Creatinine was in the range of 3.6.  CT imaging showed distended bladder, discitis/osteomyelitis of the spine.  He was transferred to [facility], ICU for pressures.  Foley was placed for urinary retention.  MRI did not show any drainable focus, pressure started off, transferred out of ICU.  Underwent disc aspiration on 6/8 by IR, cultures showed Klebsiella.  ID was consulted and recommended antibiotics.  PT/OT recommended CIR on discharge.  Because of severe volume overload, IV Lasix was started.  Cardiology also consulted  for resistant anasarca.  Prolonged hospitalization due to anasarca not responding to diuresis.  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[place])   Bladder outlet obstruction   Acute kidney injury ([place])   Uncontrolled type 2 diabetes mellitus with hyperglycemia, with long-term current use of insulin ([place])   Pacemaker   Chronic kidney disease, stage 3b ([place])   Coronary artery disease involving native coronary artery of native heart without angina pectoris   Chronic combined systolic and diastolic CHF (congestive heart failure) (HCC)   Hyponatremia   Hypokalemia   Transaminitis   Myocardial injury    Thrombocytopenia (HCC)   Anemia due to chronic kidney disease   Coagulopathy (HCC)   Protein-calorie malnutrition, moderate (HCC)   Paroxysmal atrial fibrillation (HCC)   Foot ulcer (HCC)  Chronic combined systolic/diastolic congestive heart failure/volume overload: Known history of systolic congestive heart failure.  EF is less than 20%, has grade 2 diastolic dysfunction.  Status post ICD placement.  Looks severely volume overloaded  along with bilateral lower extremity edema, scrotal edema.patient was on IV Lasix.  Hypoalbuminemia is also contributing. Takes torsemide at home.  He follows with cardiology.  Also takes Entresto at home which is on hold.   On compression stockings for his bilateral lower extremity edema. Right heart cath with finding of normal left-sided pressures with elevated RAP, moderate pulmonary hypertension.  Started on dobutamine drip.  Septic shock/Klebsiella bacteremia/UTI: Presented to [facility] initially with back pain, weakness.  Found to be very hypotensive.  Septic shock suspected.  Started on broad-spectrum antibiotics.  Sent to ICU at [facility] with pressor support.  Currently hemodynamically stable Found to have osteomyelitis/discitis of lumbar spine, psoas abscess.  Underwent disc space drainage by IR.  Culture resulted Klebsiella.  ID was consulted.  PICC line placed.  Recommended cefazolin till 7/18.  Sepsis physiology has resolved.  Leukocytosis resolved.  AKI on CKD stage IIIb: Baseline creatinine arounfd 1.8.  Presented with creatinine in the range of 3.6.  Kidney function fluctuating.   Coronary artery disease: No anginal symptoms.  On Lipitor, beta-blocker at home now on hold  Paroxysmal A-fib: Currently rate is controlled.  On Eliquis  for anticoagulation.  Monitor on telemetry.  Was on Coreg for rate control, currently on hold due to hypotension  Bladder outlet obstruction: On presentation, imaging showed distended bladder.  Takes finasteride, Flomax at home.   Does self cath at home.  Foley was placed here.   .  Type 2 diabetes/hyperglycemia: Last hemoglobin A1c of more than 10.  Continue current insulin regimen.  Monitor blood sugars  Normocytic anemia:   Hemoglobin was in the range of 11 few weeks ago.Now trended down to 7-8.  No evidence of acute blood loss.  He is on Eliquis.  Negative FOBT.  Denies any hematochezia or melena.    Moderate malnutrition: Nutritionist following  Deconditioning/debility: PT/OT recommended CIR on discharge.  Goals of care: Palliative care consulted due to persistent anasarca not responding to medications.  CODE STATUS is DNR.  Extensive discussion at the bedside again today.  He wants to be comfortable and wants  himself transitioned to comfort care  but would like to meet her daughter before that.  She is in [place] and will be here on Monday    Nutrition Problem: Moderate Malnutrition Etiology: chronic illness (CHF) Pressure Injury 11/02/21 Foot Left;Lateral Deep Tissue Pressure Injury - Purple or maroon localized area of discolored intact skin or blood-filled blister due to damage of underlying soft tissue from pressure and/or shear. 5.5x4 (Active)  11/02/21   Location: Foot  Location Orientation: Left;Lateral  Staging: Deep Tissue Pressure Injury - Purple or maroon localized area of discolored intact skin or blood-filled blister due to damage of underlying soft tissue from pressure and/or shear.  Wound Description (Comments): 5.5x4  Present on Admission: Yes  Dressing Type Gauze (Comment) 12/01/21 0500    DVT prophylaxis:Place TED hose Start: 11/23/21 1011 Place and maintain sequential compression device Start: 11/02/21 1108 apixaban (ELIQUIS) tablet 5 mg     Code Status: DNR  Family Communication: Called daughter  [name] and discussed on phone on 6/30  Patient status:Inpatient  Patient is from :Home  Anticipated discharge to: Not sure  Estimated DC date:  Plan for transitioning his care to  comfort.   Consultants: PCCM, IR, ID,palliative care  Procedures: Dx aspiration,right heart cath  Antimicrobials:  Anti-infectives (From admission, onward)    Start     Dose/Rate Route Frequency Ordered Stop   11/23/21 2200  ceFAZolin (ANCEF) IVPB 2g/100 mL premix        2 g 200 mL/hr over 30 Minutes Intravenous Every 12 hours 11/23/21 0918     11/22/21 1400  ceFAZolin (ANCEF) IVPB 2g/100 mL premix  Status:  Discontinued        2 g 200 mL/hr over 30 Minutes Intravenous Every 8 hours 11/22/21 0828 11/23/21 0918   11/20/21 2000  ceFAZolin (ANCEF) IVPB 2g/100 mL premix  Status:  Discontinued        2 g 200 mL/hr over 30 Minutes Intravenous Every 12 hours 11/20/21 1230 11/22/21 0828   11/20/21 1600  ceFAZolin (ANCEF) IVPB 2g/100 mL premix  Status:  Discontinued        2 g 200 mL/hr over 30 Minutes Intravenous Every 8 hours 11/20/21 1209 11/20/21 1230   11/12/21 1800  ceFAZolin (ANCEF) IVPB 2g/100 mL premix  Status:  Discontinued        2 g 200 mL/hr over 30 Minutes Intravenous Every 12 hours 11/12/21 0936 11/20/21 1209   11/04/21 1400  ceFAZolin (ANCEF) IVPB 2g/100 mL premix  Status:  Discontinued        2 g 200  mL/hr over 30 Minutes Intravenous Every 8 hours 11/04/21 1058 11/12/21 0936   11/03/21 1800  vancomycin (VANCOCIN) IVPB 1000 mg/200 mL premix  Status:  Discontinued        1,000 mg 200 mL/hr over 60 Minutes Intravenous Every 48 hours 11/02/21 0259 11/02/21 0452   11/02/21 2200  cefTRIAXone (ROCEPHIN) 2 g in sodium chloride 0.9 % 100 mL IVPB  Status:  Discontinued        2 g 200 mL/hr over 30 Minutes Intravenous Every 24 hours 11/02/21 0452 11/04/21 1058   11/02/21 0230  ceFEPIme (MAXIPIME) 2 g in sodium chloride 0.9 % 100 mL IVPB  Status:  Discontinued        2 g 200 mL/hr over 30 Minutes Intravenous Every 24 hours 11/02/21 0131 11/02/21 0452       Subjective:  Patient seen and examined at the bedside this morning.  Hemodynamically stable, blood pressure soft but he  looks overall comfortable.  On room air.  No new complaints today.  Objective: Vitals:   11/30/21 1956 11/30/21 2320 12/01/21 0326 12/01/21 0716  BP: 98/68 114/60 108/85 99/78  Pulse: (!) 108 97 93 99  Resp: 20 17 18 20   Temp: 98.6 F (37 C) 98.4 F (36.9 C) 98.6 F (37 C) 98.6 F (37 C)  TempSrc: Oral Oral Oral Oral  SpO2: 99% 97% 95% 95%  Weight:      Height:        Intake/Output Summary (Last 24 hours) at 12/01/2021 1110 Last data filed at 12/01/2021 0327 Gross per 24 hour  Intake --  Output 375 ml  Net -375 ml   Filed Weights   11/27/21 0705 11/28/21 0630 11/29/21 0319  Weight: 75.5 kg 69 kg 73.1 kg    Examination:    General exam: Very deconditioned, chronically ill looking, weak HEENT: PERRL Respiratory system: Mild bibasilar crackles Cardiovascular system: S1 & S2 heard, RRR.  ICD Gastrointestinal system: Abdomen is nondistended, soft and nontender. Central nervous system: Alert and oriented Extremities: Bilateral lower extremity pitting  edema, no clubbing ,no cyanosis Skin: No rashes, no ulcers,no icterus   GU: Foley, penile/scrotal edema   Data Reviewed: I have personally reviewed following labs and imaging studies  CBC: Recent Labs  Lab 11/25/21 0401 11/26/21 0126 11/27/21 0500 11/28/21 0405 11/29/21 0330  WBC 7.3 7.8 7.5 8.0 9.5  NEUTROABS  --  5.0  --   --   --   HGB 8.0* 8.1* 8.1* 8.0* 8.0*  HCT 24.4* 24.9* 24.5* 25.0* 24.5*  MCV 84.4 83.0 83.1 83.3 83.6  PLT 288 332 330 332 322   Basic Metabolic Panel: Recent Labs  Lab 11/25/21 0401 11/26/21 0126 11/27/21 0500 11/28/21 0405 11/29/21 0330  NA 131* 129* 129* 132* 132*  K 4.9 4.9 4.1 4.5 4.2  CL 97* 94* 96* 98 95*  CO2 25 24 23 25 24   GLUCOSE 129* 110* 75 77 119*  BUN 113* 124* 118* 114* 112*  CREATININE 2.65* 3.01* 2.75* 2.79* 2.86*  CALCIUM 8.3* 8.1* 7.9* 8.4* 8.4*  MG  --  2.7*  --  2.8* 2.7*     No results found for this or any previous visit (from the past 240  hour(s)).    Radiology Studies: No results found.  Scheduled Meds:  (feeding supplement) PROSource Plus  30 mL Oral TID BM   apixaban  5 mg Oral BID   artificial tears   Both Eyes QHS   vitamin C  500 mg Oral BID  atorvastatin  80 mg Oral QHS   chlorhexidine  15 mL Mouth Rinse BID   Chlorhexidine Gluconate Cloth  6 each Topical Q0600   finasteride  5 mg Oral Daily   insulin aspart  0-15 Units Subcutaneous TID WC   insulin aspart  0-5 Units Subcutaneous QHS   insulin detemir  15 Units Subcutaneous Daily   ketotifen  1 drop Both Eyes BID   latanoprost  1 drop Both Eyes QHS   mouth rinse  15 mL Mouth Rinse q12n4p   multivitamin with minerals  1 tablet Oral Daily   pantoprazole  40 mg Oral Q1200   polyethylene glycol  17 g Oral BID   sodium chloride flush  10-40 mL Intracatheter Q12H   sodium chloride flush  10-40 mL Intracatheter Q12H   tamsulosin  0.8 mg Oral QHS   zinc sulfate  220 mg Oral Daily   zolpidem  5 mg Oral QHS   Continuous Infusions:  sodium chloride 250 mL (11/02/21 0205)   sodium chloride 10 mL/hr at 11/27/21 0512    ceFAZolin (ANCEF) IV 2 g (12/01/21 0934)   DOBUTamine 2.5 mcg/kg/min (11/29/21 1338)     LOS: 30 days   Burnadette Pop, MD [facility] P6/30/2023, 11:10 AM

## 2021-12-01 NOTE — Plan of Care (Signed)
  Problem: Education: Goal: Ability to describe self-care measures that may prevent or decrease complications (Diabetes Survival Skills Education) will improve Outcome: Progressing Goal: Individualized Educational Video(s) Outcome: Progressing   Problem: Coping: Goal: Ability to adjust to condition or change in health will improve Outcome: Progressing   Problem: Fluid Volume: Goal: Ability to maintain a balanced intake and output will improve Outcome: Progressing   Problem: Health Behavior/Discharge Planning: Goal: Ability to identify and utilize available resources and services will improve Outcome: Progressing Goal: Ability to manage health-related needs will improve Outcome: Progressing   Problem: Metabolic: Goal: Ability to maintain appropriate glucose levels will improve Outcome: Progressing   Problem: Nutritional: Goal: Maintenance of adequate nutrition will improve Outcome: Progressing Goal: Progress toward achieving an optimal weight will improve Outcome: Progressing   Problem: Skin Integrity: Goal: Risk for impaired skin integrity will decrease Outcome: Progressing   Problem: Tissue Perfusion: Goal: Adequacy of tissue perfusion will improve Outcome: Progressing   Problem: Clinical Measurements: Goal: Will remain free from infection Outcome: Progressing Goal: Respiratory complications will improve Outcome: Progressing Goal: Cardiovascular complication will be avoided Outcome: Progressing   Problem: Activity: Goal: Risk for activity intolerance will decrease Outcome: Progressing   Problem: Nutrition: Goal: Adequate nutrition will be maintained Outcome: Progressing   Problem: Coping: Goal: Level of anxiety will decrease Outcome: Progressing   Problem: Elimination: Goal: Will not experience complications related to bowel motility Outcome: Progressing Goal: Will not experience complications related to urinary retention Outcome: Progressing    Problem: Pain Managment: Goal: General experience of comfort will improve Outcome: Progressing   Problem: Safety: Goal: Ability to remain free from injury will improve Outcome: Progressing   Problem: Skin Integrity: Goal: Risk for impaired skin integrity will decrease Outcome: Progressing   Problem: Cardiovascular: Goal: Ability to achieve and maintain adequate cardiovascular perfusion will improve Outcome: Progressing Goal: Vascular access site(s) Level 0-1 will be maintained Outcome: Progressing   Problem: Activity: Goal: Ability to return to baseline activity level will improve Outcome: Progressing   Problem: Education: Goal: Understanding of CV disease, CV risk reduction, and recovery process will improve Outcome: Progressing Goal: Individualized Educational Video(s) Outcome: Progressing

## 2021-12-02 DIAGNOSIS — Z515 Encounter for palliative care: Secondary | ICD-10-CM | POA: Diagnosis not present

## 2021-12-02 DIAGNOSIS — R6521 Severe sepsis with septic shock: Secondary | ICD-10-CM | POA: Diagnosis not present

## 2021-12-02 DIAGNOSIS — I5023 Acute on chronic systolic (congestive) heart failure: Secondary | ICD-10-CM | POA: Diagnosis not present

## 2021-12-02 DIAGNOSIS — A419 Sepsis, unspecified organism: Secondary | ICD-10-CM | POA: Diagnosis not present

## 2021-12-02 LAB — GLUCOSE, CAPILLARY
Glucose-Capillary: 190 mg/dL — ABNORMAL HIGH (ref 70–99)
Glucose-Capillary: 212 mg/dL — ABNORMAL HIGH (ref 70–99)
Glucose-Capillary: 87 mg/dL (ref 70–99)
Glucose-Capillary: 109 mg/dL — ABNORMAL HIGH (ref 70–99)

## 2021-12-02 MED ORDER — HYDROMORPHONE HCL 1 MG/ML IJ SOLN
0.5000 mg | INTRAMUSCULAR | Status: DC | PRN
  Administered 2021-12-02 – 2021-12-03 (×3): 0.5 mg via INTRAVENOUS
  Filled 2021-12-02 (×2): qty 0.5
  Filled 2021-12-02: qty 1

## 2021-12-02 MED ORDER — OXYCODONE HCL 5 MG PO TABS
5.0000 mg | ORAL_TABLET | Freq: Once | ORAL | Status: AC
  Administered 2021-12-02: 5 mg via ORAL
  Filled 2021-12-02: qty 1

## 2021-12-02 MED ORDER — LIDOCAINE 5 % EX PTCH
2.0000 | MEDICATED_PATCH | Freq: Every day | CUTANEOUS | Status: DC
  Administered 2021-12-02 – 2021-12-07 (×4): 2 via TRANSDERMAL
  Filled 2021-12-02 (×5): qty 2

## 2021-12-02 NOTE — Progress Notes (Signed)
PROGRESS NOTE  Glenn [name]  [id number] DOB: [birth year] DOA: 10/19/2021 PCP: System, Provider Not In   Brief Narrative:  Patient is a 81 y.o. M with CAD s/p CABG 96, ICM and sCHF EF <20%, DM, CKD IIIb baseline 1.7, pAF on Eliquis, hx PPM and BPH who presented with 3 days weakness, back pain to [facility].  Patient does intermittent catheterization at home whenever he feels like doing usually every 48 hours.  On presentation he was hyportensive.  Lab work showed leukocytosis.  Creatinine was in the range of 3.6.  CT imaging showed distended bladder, discitis/osteomyelitis of the spine.  He was transferred to [facility], ICU for pressures.  Foley was placed for urinary retention.  MRI did not show any drainable focus, pressure started off, transferred out of ICU.  Underwent disc aspiration on 6/8 by IR, cultures showed Klebsiella.  ID was consulted and recommended antibiotics.  PT/OT recommended CIR on discharge.  Because of severe volume overload, IV Lasix was started.  Cardiology also consulted  for resistant anasarca.  Prolonged hospitalization due to anasarca not responding to diuresis.  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[place])   Bladder outlet obstruction   Acute kidney injury ([place])   Uncontrolled type 2 diabetes mellitus with hyperglycemia, with long-term current use of insulin ([place])   Pacemaker   Chronic kidney disease, stage 3b ([place])   Coronary artery disease involving native coronary artery of native heart without angina pectoris   Chronic combined systolic and diastolic CHF (congestive heart failure) (HCC)   Hyponatremia   Hypokalemia   Transaminitis   Myocardial injury    Thrombocytopenia (HCC)   Anemia due to chronic kidney disease   Coagulopathy (HCC)   Protein-calorie malnutrition, moderate (HCC)   Paroxysmal atrial fibrillation (HCC)   Foot ulcer ([place])   Acute on chronic systolic congestive heart failure (HCC)  Chronic combined systolic/diastolic congestive heart failure/volume overload: Known history of systolic congestive heart failure.  EF is less than 20%, has grade 2 diastolic dysfunction.  Status post ICD placement.  Looks severely volume overloaded  along with bilateral lower extremity edema, scrotal edema.patient was on IV Lasix.  Hypoalbuminemia is also contributing. Takes torsemide at home.  He follows with cardiology.  Also takes Entresto at home which is on hold.   On compression stockings for his bilateral lower extremity edema. Right heart cath with finding of normal left-sided pressures with elevated RAP, moderate pulmonary hypertension.  Started on dobutamine drip.  Septic shock/Klebsiella bacteremia/UTI: Presented to [facility] initially with back pain, weakness.  Found to be very hypotensive.  Septic shock suspected.  Started on broad-spectrum antibiotics.  Sent to ICU at [facility] with pressor support.  Currently hemodynamically stable Found to have osteomyelitis/discitis of lumbar spine, psoas abscess.  Underwent disc space drainage by IR.  Culture resulted Klebsiella.  ID was consulted.  PICC line placed.  Recommended cefazolin till 7/18.  Sepsis physiology has resolved.  Leukocytosis resolved.  AKI on CKD stage IIIb: Baseline creatinine arounfd 1.8.  Presented with creatinine in the range of 3.6.  Kidney function fluctuating.   Coronary artery disease: No anginal symptoms.  On Lipitor, beta-blocker at home now on hold  Paroxysmal A-fib: Currently rate is controlled.  On Eliquis for anticoagulation.  Monitor on telemetry.  Was on Coreg for rate control, currently on hold due to hypotension  Bladder outlet obstruction: On presentation, imaging showed  distended bladder.  Takes finasteride, Flomax at home.  Does self cath at home.  Foley was placed here.   .  Type 2 diabetes/hyperglycemia: Last hemoglobin A1c of more than 10.  Continue current insulin regimen.  Monitor blood sugars  Normocytic anemia:   Hemoglobin was in the range of 11 few weeks ago.Now trended down to 7-8.  No evidence of acute blood loss.  He is on Eliquis.  Negative FOBT.  Denies any hematochezia or melena.    Moderate malnutrition: Nutritionist following  Deconditioning/debility: PT/OT recommended CIR on discharge.  Goals of care: Palliative care consulted due to persistent anasarca not responding to medications.  CODE STATUS is DNR.  Extensive discussion at the bedside again today.  He wants to be comfortable and wants  himself transitioned to comfort care  but would like to meet her daughter before that.  She is in [place] and will be here on Monday    Nutrition Problem: Moderate Malnutrition Etiology: chronic illness (CHF) Pressure Injury 11/02/21 Foot Left;Lateral Deep Tissue Pressure Injury - Purple or maroon localized area of discolored intact skin or blood-filled blister due to damage of underlying soft tissue from pressure and/or shear. 5.5x4 (Active)  11/02/21   Location: Foot  Location Orientation: Left;Lateral  Staging: Deep Tissue Pressure Injury - Purple or maroon localized area of discolored intact skin or blood-filled blister due to damage of underlying soft tissue from pressure and/or shear.  Wound Description (Comments): 5.5x4  Present on Admission: Yes  Dressing Type Gauze (Comment) 12/01/21 2000    DVT prophylaxis:Place TED hose Start: 11/23/21 1011 Place and maintain sequential compression device Start: 11/02/21 1108 apixaban (ELIQUIS) tablet 5 mg     Code Status: DNR  Family Communication: Called daughter  Trudie Reed and discussed on phone on 6/30  Patient status:Inpatient  Patient is from :Home  Anticipated discharge to: Not  sure  Estimated DC date:  Plan for transitioning his care to comfort.   Consultants: PCCM, IR, ID,palliative care  Procedures: Dx aspiration,right heart cath  Antimicrobials:  Anti-infectives (From admission, onward)    Start     Dose/Rate Route Frequency Ordered Stop   11/23/21 2200  ceFAZolin (ANCEF) IVPB 2g/100 mL premix        2 g 200 mL/hr over 30 Minutes Intravenous Every 12 hours 11/23/21 0918     11/22/21 1400  ceFAZolin (ANCEF) IVPB 2g/100 mL premix  Status:  Discontinued        2 g 200 mL/hr over 30 Minutes Intravenous Every 8 hours 11/22/21 0828 11/23/21 0918   11/20/21 2000  ceFAZolin (ANCEF) IVPB 2g/100 mL premix  Status:  Discontinued        2 g 200 mL/hr over 30 Minutes Intravenous Every 12 hours 11/20/21 1230 11/22/21 0828   11/20/21 1600  ceFAZolin (ANCEF) IVPB 2g/100 mL premix  Status:  Discontinued        2 g 200 mL/hr over 30 Minutes Intravenous Every 8 hours 11/20/21 1209 11/20/21 1230   11/12/21 1800  ceFAZolin (ANCEF) IVPB 2g/100 mL premix  Status:  Discontinued        2 g 200 mL/hr over 30 Minutes Intravenous Every 12 hours 11/12/21 0936 11/20/21 1209   11/04/21 1400  ceFAZolin (ANCEF) IVPB 2g/100 mL premix  Status:  Discontinued  2 g 200 mL/hr over 30 Minutes Intravenous Every 8 hours 11/04/21 1058 11/12/21 0936   11/03/21 1800  vancomycin (VANCOCIN) IVPB 1000 mg/200 mL premix  Status:  Discontinued        1,000 mg 200 mL/hr over 60 Minutes Intravenous Every 48 hours 11/02/21 0259 11/02/21 0452   11/02/21 2200  cefTRIAXone (ROCEPHIN) 2 g in sodium chloride 0.9 % 100 mL IVPB  Status:  Discontinued        2 g 200 mL/hr over 30 Minutes Intravenous Every 24 hours 11/02/21 0452 11/04/21 1058   11/02/21 0230  ceFEPIme (MAXIPIME) 2 g in sodium chloride 0.9 % 100 mL IVPB  Status:  Discontinued        2 g 200 mL/hr over 30 Minutes Intravenous Every 24 hours 11/02/21 0131 11/02/21 0452       Subjective:  Patient seen and examined at the bedside this  morning.  Remains hemodynamically stable.  Overall comfortable.  Lying in bed.  Weak.  Still has bilateral lower EXTR edema, scrotal edema.  Not responding to any medications.  We discussed about our plan, waiting for her daughter to arrive from [place] to transition his care to comfort.  He was grateful for our care  Objective: Vitals:   12/01/21 1943 12/01/21 2355 12/02/21 0429 12/02/21 0824  BP: 130/69 116/64 104/61 (!) 104/55  Pulse: (!) 105 (!) 102 (!) 102 100  Resp: 20 20 19 15   Temp: 97.7 F (36.5 C) 97.7 F (36.5 C) 97.6 F (36.4 C) 97.6 F (36.4 C)  TempSrc: Oral Oral Oral Oral  SpO2: 99% 98% 95% 98%  Weight:      Height:        Intake/Output Summary (Last 24 hours) at 12/02/2021 1103 Last data filed at 12/01/2021 2300 Gross per 24 hour  Intake 730 ml  Output 250 ml  Net 480 ml   Filed Weights   11/27/21 0705 11/28/21 0630 11/29/21 0319  Weight: 75.5 kg 69 kg 73.1 kg    Examination:    General exam: Very deconditioned, chronically ill looking, weak HEENT: PERRL Respiratory system: No wheezing, mild bibasilar crackles  cardiovascular system: S1 & S2 heard, RRR.  ICD Gastrointestinal system: Abdomen is nondistended, soft and nontender. Central nervous system: Alert and oriented Extremities: Bilateral lower extremity pitting  edema, no clubbing ,no cyanosis Skin: No rashes, no ulcers,no icterus   GU: Foley, penile/scrotal edema   Data Reviewed: I have personally reviewed following labs and imaging studies  CBC: Recent Labs  Lab 11/26/21 0126 11/27/21 0500 11/28/21 0405 11/29/21 0330  WBC 7.8 7.5 8.0 9.5  NEUTROABS 5.0  --   --   --   HGB 8.1* 8.1* 8.0* 8.0*  HCT 24.9* 24.5* 25.0* 24.5*  MCV 83.0 83.1 83.3 83.6  PLT 332 330 332 322   Basic Metabolic Panel: Recent Labs  Lab 11/26/21 0126 11/27/21 0500 11/28/21 0405 11/29/21 0330  NA 129* 129* 132* 132*  K 4.9 4.1 4.5 4.2  CL 94* 96* 98 95*  CO2 24 23 25 24   GLUCOSE 110* 75 77 119*  BUN 124*  118* 114* 112*  CREATININE 3.01* 2.75* 2.79* 2.86*  CALCIUM 8.1* 7.9* 8.4* 8.4*  MG 2.7*  --  2.8* 2.7*     No results found for this or any previous visit (from the past 240 hour(s)).    Radiology Studies: No results found.  Scheduled Meds:  (feeding supplement) PROSource Plus  30 mL Oral TID BM   apixaban  5 mg Oral BID   artificial tears   Both Eyes QHS   vitamin C  500 mg Oral BID   atorvastatin  80 mg Oral QHS   chlorhexidine  15 mL Mouth Rinse BID   Chlorhexidine Gluconate Cloth  6 each Topical Q0600   finasteride  5 mg Oral Daily   insulin aspart  0-15 Units Subcutaneous TID WC   insulin aspart  0-5 Units Subcutaneous QHS   insulin detemir  15 Units Subcutaneous Daily   ketotifen  1 drop Both Eyes BID   latanoprost  1 drop Both Eyes QHS   lidocaine  2 patch Transdermal Daily   mouth rinse  15 mL Mouth Rinse q12n4p   multivitamin with minerals  1 tablet Oral Daily   pantoprazole  40 mg Oral Q1200   polyethylene glycol  17 g Oral BID   sodium chloride flush  10-40 mL Intracatheter Q12H   sodium chloride flush  10-40 mL Intracatheter Q12H   tamsulosin  0.8 mg Oral QHS   zinc sulfate  220 mg Oral Daily   zolpidem  5 mg Oral QHS   Continuous Infusions:  sodium chloride 250 mL (11/02/21 0205)   sodium chloride 10 mL/hr at 11/27/21 0512    ceFAZolin (ANCEF) IV 2 g (12/02/21 1009)   DOBUTamine 2.5 mcg/kg/min (12/01/21 1335)     LOS: 31 days   Shelly Coss, MD [facility] P7/06/2021, 11:03 AM

## 2021-12-02 NOTE — Progress Notes (Signed)
Progress Note  Patient Name: Glenn [name] Date of Encounter: 12/02/2021  [facility]  Subjective   No CP or dyspnea; back pain earlier improved with pain medications.  Inpatient Medications    Scheduled Meds:  (feeding supplement) PROSource Plus  30 mL Oral TID BM   apixaban  5 mg Oral BID   artificial tears   Both Eyes QHS   vitamin C  500 mg Oral BID   atorvastatin  80 mg Oral QHS   chlorhexidine  15 mL Mouth Rinse BID   Chlorhexidine Gluconate Cloth  6 each Topical Q0600   finasteride  5 mg Oral Daily   insulin aspart  0-15 Units Subcutaneous TID WC   insulin aspart  0-5 Units Subcutaneous QHS   insulin detemir  15 Units Subcutaneous Daily   ketotifen  1 drop Both Eyes BID   latanoprost  1 drop Both Eyes QHS   lidocaine  2 patch Transdermal Daily   mouth rinse  15 mL Mouth Rinse q12n4p   multivitamin with minerals  1 tablet Oral Daily   pantoprazole  40 mg Oral Q1200   polyethylene glycol  17 g Oral BID   sodium chloride flush  10-40 mL Intracatheter Q12H   sodium chloride flush  10-40 mL Intracatheter Q12H   tamsulosin  0.8 mg Oral QHS   zinc sulfate  220 mg Oral Daily   zolpidem  5 mg Oral QHS   Continuous Infusions:  sodium chloride 250 mL (11/02/21 0205)   sodium chloride 10 mL/hr at 11/27/21 0512    ceFAZolin (ANCEF) IV 2 g (12/02/21 1009)   DOBUTamine 2.5 mcg/kg/min (12/01/21 1335)   PRN Meds: acetaminophen, hydrALAZINE, HYDROmorphone (DILAUDID) injection, ipratropium-albuterol, ondansetron (ZOFRAN) IV, oxyCODONE, polyvinyl alcohol, senna-docusate   Vital Signs    Vitals:   12/01/21 1943 12/01/21 2355 12/02/21 0429 12/02/21 0824  BP: 130/69 116/64 104/61 (!) 104/55  Pulse: (!) 105 (!) 102 (!) 102 100  Resp: 20 20 19 15   Temp: 97.7 F (36.5 C) 97.7 F (36.5 C) 97.6 F (36.4 C) 97.6 F (36.4 C)  TempSrc: Oral Oral Oral Oral  SpO2: 99% 98% 95% 98%  Weight:      Height:        Intake/Output Summary (Last 24 hours) at  12/02/2021 1017 Last data filed at 12/01/2021 2300 Gross per 24 hour  Intake 730 ml  Output 250 ml  Net 480 ml      11/29/2021    3:19 AM 11/28/2021    6:30 AM 11/27/2021    7:05 AM  Last 3 Weights  Weight (lbs) 161 lb 2.5 oz 152 lb 1.9 oz 166 lb 7.2 oz  Weight (kg) 73.1 kg 69 kg 75.5 kg      Telemetry    Atrial paced with occasional PVCs and nonsustained ventricular tachycardia- Personally Reviewed   Physical Exam   GEN: No acute distress.  Anasarca Neck: Positive JVD Cardiac: RRR, 2/6 systolic murmur left sternal border Respiratory: Clear to auscultation bilaterally. GI: Soft, nontender, non-distended  MS: 2+ edema Neuro:  Nonfocal  Psych: Normal affect   Labs   Chemistry Recent Labs  Lab 11/26/21 0126 11/27/21 0500 11/28/21 0405 11/29/21 0330  NA 129* 129* 132* 132*  K 4.9 4.1 4.5 4.2  CL 94* 96* 98 95*  CO2 24 23 25 24   GLUCOSE 110* 75 77 119*  BUN 124* 118* 114* 112*  CREATININE 3.01* 2.75* 2.79* 2.86*  CALCIUM 8.1* 7.9* 8.4* 8.4*  MG  2.7*  --  2.8* 2.7*  PROT 6.7  --   --   --   ALBUMIN 2.6*  --   --   --   AST 28  --   --   --   ALT <5  --   --   --   ALKPHOS 211*  --   --   --   BILITOT 0.3  --   --   --   GFRNONAA 21* 23* 22* 22*  ANIONGAP 11 10 9 13      Hematology Recent Labs  Lab 11/27/21 0500 11/28/21 0405 11/29/21 0330  WBC 7.5 8.0 9.5  RBC 2.95* 3.00* 2.93*  HGB 8.1* 8.0* 8.0*  HCT 24.5* 25.0* 24.5*  MCV 83.1 83.3 83.6  MCH 27.5 26.7 27.3  MCHC 33.1 32.0 32.7  RDW 17.3* 17.4* 17.8*  PLT 330 332 322     Patient Profile     81 y.o. male with a history of CAD s/p remote CABG in 1996, chronic combined CHF with EF of 20% s/p [employer] ICD, paroxysmal atrial fibrillation on Eliquis, hypertension, type 2 diabetes mellitus, CKD stage III who presented to [facility] on November 18, 2021 with generalized weakness and back pain and was found to have septic shock secondary to urosepsis with hydronephrosis and AKI as well as L4-L5   osteomyelitis/discitis. He was transferred to [facility] where he was admitted to medical ICU. He underwent disc aspiration on 6/8 by IR which grew Klebsiella. He was treated with IV fluids per sepsis protocol and then went into acute CHF. He was started on IV Lasix but remained significant volume overloaded. Therefore, Cardiology was consulted for assistance on 11/20/2021.  Echocardiogram June 2023 showed severe LV dysfunction with ejection fraction less than 20%, grade 2 diastolic dysfunction, mild mitral regurgitation, moderate tricuspid regurgitation.  Right heart catheterization June 2023 showed RA pressure 12, pulmonary artery pressure 62/18 and pulmonary capillary wedge pressure of 13.  Assessment & Plan    1 acute on chronic systolic congestive heart failure-patient remains volume overloaded.  As outlined in previous notes patient is a no CODE BLUE.  His family is arriving Monday by report and then plan is to discontinue dobutamine and transition to comfort care.  2 paroxysmal atrial fibrillation-continue apixaban for now but can be discontinued when dobutamine is DC'd.  3 status post ICD-plan to deactivate once patient transitions to comfort care.  4 acute on chronic stage IIIb kidney disease-no further lab draws.  We will see again Monday.  For questions or updates, please contact [facility] Please consult [email or web address] for contact info under        Signed, Friday, MD  12/02/2021, 10:17 AM

## 2021-12-02 NOTE — Progress Notes (Signed)
Palliative Medicine Inpatient Follow Up Note   HPI: 81 y.o. male  with past medical history of CHF, s/p Chemical engineer PPM/ICD, CAD, IDDM, CKD stage 3b, prostatomegaly admitted on 10/10/2021 with weakness x 3 days after fall followed with pain, difficulty to ambulate, decrease intake and urine output. Noted to have hypotension, leukocytosis related to discitis aspiration showing +Klebsiella along with UTI. Hospitalization complicated by cardiogenic shock EF <74%, grade 2 diastolic dysfunction, moderate pulmonary hypertension, renal failure, bladder outlet obstruction, paroxysmal atrial fibrillation. Ongoing significant fluid overload and poor response to diuresis. Trial large dose of Lasix while on dobutamine.   Today's Discussion 12/02/2021  *Please note that this is a verbal dictation therefore any spelling or grammatical errors are due to the "[facility] [employer]" system interpretation.  Chart reviewed inclusive of vital signs, progress notes, laboratory results, and diagnostic images.   Patient's nurse, Judson Roch expresses concerns over [place] pain management and asked if I could offer anything additional.  I met with Mr. Navarra at bedside this morning.  He expresses that he is having severe unrelenting pain from his lower back down both legs -it is both sharp and burning in quality.  He shares that the oxycodone he is on helps though only for short period.  We discussed instituting a stronger pain medication such as Dilaudid to better address his pain.  I also discussed placing a Lidoderm patch on his lower back.  He is in agreement with this plan.  Created space and opportunity for patient to explore thoughts feelings and fears regarding current medical situation.  Mr. Barberi is very well aware that he is waiting for his daughter to arrive on Monday and then the focus will shift to making him comfortable.  I explored how he felt about that further and he shares that he is at peace with dying  and fully anticipates he will meet his end on Monday.  Questions and concerns addressed   Palliative Support Provided  Objective Assessment: Vital Signs Vitals:   12/02/21 0429 12/02/21 0824  BP: 104/61 (!) 104/55  Pulse: (!) 102 100  Resp: 19 15  Temp: 97.6 F (36.4 C) 97.6 F (36.4 C)  SpO2: 95% 98%    Intake/Output Summary (Last 24 hours) at 12/02/2021 1016 Last data filed at 12/01/2021 2300 Gross per 24 hour  Intake 730 ml  Output 250 ml  Net 480 ml   Last Weight  Most recent update: 11/29/2021  6:49 AM    Weight  73.1 kg (161 lb 2.5 oz)            Gen: Elderly African-American male in no acute distress HEENT: Dry mucous membranes CV: Irregular rate and rhythm PULM: On room air breathing is even and nonlabored ABD: soft/nontender EXT: 2+ bilateral lower extremity edema Neuro: Alert and oriented x3  SUMMARY OF RECOMMENDATIONS   DNAR/DNI  Have initiated low-dose Dilaudid as needed for pain  Have added to Lidoderm patches for lower back to help with neuropathic components of pain  Patient's daughter to arrive Monday after which time focus will change to complete comfort  Patient is at peace with the idea of leaving this earth  Ongoing palliative support   Billing based on MDM: High  Problems Addressed: One acute or chronic illness or injury that poses a threat to life or bodily function  Amount and/or Complexity of Data: Category 3:Discussion of management or test interpretation with external physician/other qualified health care professional/appropriate source (not separately reported)  Risks: Parenteral controlled  substances ______________________________________________________________________________________ [facility] Team Team Cell Phone: [phone] Please utilize secure chat with additional questions, if there is no response within 30 minutes please call the above phone number  Palliative Medicine Team  providers are available by phone from 7am to 7pm daily and can be reached through the team cell phone.  Should this patient require assistance outside of these hours, please call the patient's attending physician.

## 2021-12-02 DEATH — deceased

## 2021-12-03 ENCOUNTER — Inpatient Hospital Stay (HOSPITAL_COMMUNITY): Payer: Medicare Other

## 2021-12-03 DIAGNOSIS — Z7189 Other specified counseling: Secondary | ICD-10-CM | POA: Diagnosis not present

## 2021-12-03 DIAGNOSIS — Z515 Encounter for palliative care: Secondary | ICD-10-CM | POA: Diagnosis not present

## 2021-12-03 DIAGNOSIS — A419 Sepsis, unspecified organism: Secondary | ICD-10-CM | POA: Diagnosis not present

## 2021-12-03 DIAGNOSIS — I5023 Acute on chronic systolic (congestive) heart failure: Secondary | ICD-10-CM | POA: Diagnosis not present

## 2021-12-03 DIAGNOSIS — R6521 Severe sepsis with septic shock: Secondary | ICD-10-CM | POA: Diagnosis not present

## 2021-12-03 LAB — GLUCOSE, CAPILLARY
Glucose-Capillary: 255 mg/dL — ABNORMAL HIGH (ref 70–99)
Glucose-Capillary: 246 mg/dL — ABNORMAL HIGH (ref 70–99)
Glucose-Capillary: 177 mg/dL — ABNORMAL HIGH (ref 70–99)

## 2021-12-03 MED ORDER — ALPRAZOLAM 0.5 MG PO TABS: 0.5000 mg | ORAL_TABLET | Freq: Two times a day (BID) | ORAL | Status: DC | PRN

## 2021-12-03 NOTE — Progress Notes (Signed)
Palliative:  HPI: 81 y.o. male  with past medical history of CHF, s/p Environmental manager PPM/ICD, CAD, IDDM, CKD stage 3b, prostatomegaly admitted on 10/18/2021 with weakness x 3 days after fall followed with pain, difficulty to ambulate, decrease intake and urine output. Noted to have hypotension, leukocytosis related to discitis aspiration showing +Klebsiella along with UTI. Hospitalization complicated by cardiogenic shock EF <20%, grade 2 diastolic dysfunction, moderate pulmonary hypertension, renal failure, bladder outlet obstruction, paroxysmal atrial fibrillation. Ongoing significant fluid overload and poor response to diuresis. Trial large dose of Lasix while on dobutamine.    I was called by RN regarding Glenn [name]. Glenn [name] is sleeping peacefully without distress so I did not awaken him. No family at bedside. Reviewed use of pain medication and noted that he is not needing PRNs that often so no changes needed. RN reports that he has been frustrated today complaining of being tired and just wanting to sleep. Discussed cluster care and minimizing interventions to awaken him to allow for rest. Orders reviewed and changed. RN also reports that he has told her that he is not anxious but he also can't turn his mind off and stop thinking about everything. Will add PRN Xanax to assist with anxiety/sleep as needed.   His other daughter is planned to return tomorrow - unsure of timing. I do not believe that she is aware of the situation with her father. I will follow up tomorrow.   Exam: Sleeping peacefully without distress. Breathing regular, unlabored.   Plan: - DNR, AICD deactivate.  - Xanax PRN added.  - No changes to pain medication.  - Minimized CBG, vital signs, and care to interrupt his rest.  - Plans for comfort transition after daughter arrives today.   25 min  Glenn Channel, NP Palliative Medicine Team Pager [phone] (Please see [email or web address] for schedule) Team Phone [phone]     Greater than 50%  of this time was spent counseling and coordinating care related to the above assessment and plan

## 2021-12-03 NOTE — Progress Notes (Signed)
PROGRESS NOTE  Glenn [name]  OIN:[id number] DOB: 25-Feb-1941 DOA: 10/16/2021 PCP: System, Provider Not In   Brief Narrative:  Patient is a 81 y.o. M with CAD s/p CABG 96, ICM and sCHF EF <20%, DM, CKD IIIb baseline 1.7, pAF on Eliquis, hx PPM and BPH who presented with 3 days weakness, back pain to [facility].  Patient does intermittent catheterization at home whenever he feels like doing usually every 48 hours.  On presentation he was hyportensive.  Lab work showed leukocytosis.  Creatinine was in the range of 3.6.  CT imaging showed distended bladder, discitis/osteomyelitis of the spine.  He was transferred to [facility], ICU for pressures.  Foley was placed for urinary retention.  MRI did not show any drainable focus, pressure started off, transferred out of ICU.  Underwent disc aspiration on 6/8 by IR, cultures showed Klebsiella.  ID was consulted and recommended antibiotics.  PT/OT recommended CIR on discharge.  Because of severe volume overload, IV Lasix was started.  Cardiology also consulted  for resistant anasarca.  Prolonged hospitalization due to anasarca not responding to diuresis.  Now palliative care following for goals of care, plan is to convert his care to comfort after one of his   daughters arrives from [place]( On Monday/Tuesday)  Assessment & Plan:  Principal Problem:   Septic shock (HCC) Active Problems:   Bacteremia due to Klebsiella pneumoniae   Discitis   Osteomyelitis of lumbar spine (HCC)   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HCC)   Bladder outlet obstruction   Acute kidney injury (HCC)   Uncontrolled type 2 diabetes mellitus with hyperglycemia, with long-term current use of insulin (HCC)   Pacemaker   Chronic kidney disease, stage 3b (HCC)   Coronary artery disease involving native coronary artery of native heart without angina pectoris   Chronic combined systolic and diastolic CHF (congestive heart failure) (HCC)   Hyponatremia   Hypokalemia   Transaminitis   Myocardial injury    Thrombocytopenia (HCC)   Anemia due to chronic kidney disease   Coagulopathy (HCC)   Protein-calorie malnutrition, moderate (HCC)   Paroxysmal atrial fibrillation (HCC)   Foot ulcer (HCC)   Acute on chronic systolic congestive heart failure (HCC)  Chronic combined systolic/diastolic congestive heart failure/volume overload: Known history of systolic congestive heart failure.  EF is less than 20%, has grade 2 diastolic dysfunction.  Status post ICD placement.  Looks severely volume overloaded  along with bilateral lower extremity edema, scrotal edema.patient was on IV Lasix.  Hypoalbuminemia is also contributing. Takes torsemide at home.  He follows with cardiology.  Also takes Entresto at home which is on hold.   On compression stockings for his bilateral lower extremity edema. Right heart cath with finding of normal left-sided pressures with elevated RAP, moderate pulmonary hypertension.  Started on dobutamine drip.  Septic shock/Klebsiella bacteremia/UTI: Presented to [facility] initially with back pain, weakness.  Found to be very hypotensive.  Septic shock suspected.  Started on broad-spectrum antibiotics.  [facility] to ICU at [facility] with pressor support.  Currently hemodynamically stable Found to have osteomyelitis/discitis of lumbar spine, psoas abscess.  Underwent disc space drainage by IR.  Culture resulted Klebsiella.  ID was consulted.  PICC line placed.  Recommended cefazolin till 7/18.  Sepsis physiology has resolved.  Leukocytosis resolved.  AKI on CKD stage IIIb: Baseline creatinine arounfd 1.8.  Presented with creatinine in the range of 3.6.  Kidney function fluctuating.   Coronary artery disease: No anginal symptoms.  On Lipitor, beta-blocker at home now on hold  Paroxysmal A-fib: Currently rate is controlled.  On Eliquis for anticoagulation.  Monitor on telemetry.  Was on Coreg for rate control, currently on hold due to hypotension  Bladder outlet obstruction: On presentation, imaging showed  distended bladder.  Takes finasteride, Flomax at home.  Does self cath at home.  Foley was placed here.   .  Type 2 diabetes/hyperglycemia: Last hemoglobin A1c of more than 10.  Continue current insulin regimen.  Monitor blood sugars  Normocytic anemia:   Hemoglobin was in the range of 11 few weeks ago.Now trended down to 7-8.  No evidence of acute blood loss.  He is on Eliquis.  Negative FOBT.  Denies any hematochezia or melena.    Moderate malnutrition: Nutritionist following  Deconditioning/debility: PT/OT recommended CIR on discharge.  Goals of care: Palliative care consulted due to persistent anasarca not responding to medications.  CODE STATUS is DNR.  Extensive discussion at the bedside again today.  He wants to be comfortable and wants  himself transitioned to comfort care  but would like to meet her daughter before that.  She is in [place] and will be here on Monday    Nutrition Problem: Moderate Malnutrition Etiology: chronic illness (CHF) Pressure Injury 11/02/21 Foot Left;Lateral Deep Tissue Pressure Injury - Purple or maroon localized area of discolored intact skin or blood-filled blister due to damage of underlying soft tissue from pressure and/or shear. 5.5x4 (Active)  11/02/21   Location: Foot  Location Orientation: Left;Lateral  Staging: Deep Tissue Pressure Injury - Purple or maroon localized area of discolored intact skin or blood-filled blister due to damage of underlying soft tissue from pressure and/or shear.  Wound Description (Comments): 5.5x4  Present on Admission: Yes  Dressing Type Gauze (Comment) 12/02/21 2000    DVT prophylaxis:Place TED hose Start: 11/23/21 1011 Place and maintain sequential compression device Start: 11/02/21 1108 apixaban (ELIQUIS) tablet 5 mg     Code Status: DNR  Family Communication: Called daughter  Trudie Reed and discussed on phone on 6/30  Patient status:Inpatient  Patient is from :Home  Anticipated discharge to: Not  sure  Estimated DC date:  Plan for transitioning his care to comfort.   Consultants: PCCM, IR, ID,palliative care  Procedures: Dx aspiration,right heart cath  Antimicrobials:  Anti-infectives (From admission, onward)    Start     Dose/Rate Route Frequency Ordered Stop   11/23/21 2200  ceFAZolin (ANCEF) IVPB 2g/100 mL premix        2 g 200 mL/hr over 30 Minutes Intravenous Every 12 hours 11/23/21 0918     11/22/21 1400  ceFAZolin (ANCEF) IVPB 2g/100 mL premix  Status:  Discontinued        2 g 200 mL/hr over 30 Minutes Intravenous Every 8 hours 11/22/21 0828 11/23/21 0918   11/20/21 2000  ceFAZolin (ANCEF) IVPB 2g/100 mL premix  Status:  Discontinued        2 g 200 mL/hr over 30 Minutes Intravenous Every 12 hours 11/20/21 1230 11/22/21 0828   11/20/21 1600  ceFAZolin (ANCEF) IVPB 2g/100 mL premix  Status:  Discontinued        2 g 200 mL/hr over 30 Minutes Intravenous Every 8 hours 11/20/21 1209 11/20/21 1230   11/12/21 1800  ceFAZolin (ANCEF) IVPB 2g/100 mL premix  Status:  Discontinued        2 g 200 mL/hr over 30 Minutes Intravenous Every 12 hours 11/12/21 0936 11/20/21 1209   11/04/21 1400  ceFAZolin (ANCEF) IVPB 2g/100 mL premix  Status:  Discontinued  2 g 200 mL/hr over 30 Minutes Intravenous Every 8 hours 11/04/21 1058 11/12/21 0936   11/03/21 1800  vancomycin (VANCOCIN) IVPB 1000 mg/200 mL premix  Status:  Discontinued        1,000 mg 200 mL/hr over 60 Minutes Intravenous Every 48 hours 11/02/21 0259 11/02/21 0452   11/02/21 2200  cefTRIAXone (ROCEPHIN) 2 g in sodium chloride 0.9 % 100 mL IVPB  Status:  Discontinued        2 g 200 mL/hr over 30 Minutes Intravenous Every 24 hours 11/02/21 0452 11/04/21 1058   11/02/21 0230  ceFEPIme (MAXIPIME) 2 g in sodium chloride 0.9 % 100 mL IVPB  Status:  Discontinued        2 g 200 mL/hr over 30 Minutes Intravenous Every 24 hours 11/02/21 0131 11/02/21 0452       Subjective:  Patient seen and examined at the bedside this  morning.  Hemodynamically stable.  Comfortable.  On room air.  He had a good night sleep.  Continues to have severe bilateral lower extremity edema, scrotal swelling  Objective: Vitals:   12/02/21 2100 12/03/21 0000 12/03/21 0500 12/03/21 0804  BP: 114/66 125/85 116/71 (!) 100/57  Pulse: 100 100 (!) 102 (!) 103  Resp: 16 16 14 17   Temp:  97.7 F (36.5 C) 97.7 F (36.5 C) 98.3 F (36.8 C)  TempSrc: Oral Oral Oral Oral  SpO2: 98% 97% 97% 96%  Weight:      Height:        Intake/Output Summary (Last 24 hours) at 12/03/2021 1032 Last data filed at 12/02/2021 2233 Gross per 24 hour  Intake 250 ml  Output --  Net 250 ml   Filed Weights   11/27/21 0705 11/28/21 0630 11/29/21 0319  Weight: 75.5 kg 69 kg 73.1 kg    Examination:   General exam: Overall comfortable, not in distress, very deconditioned, chronically looking HEENT: PERRL Respiratory system: Fine crackles in bilateral bases Cardiovascular system: S1 & S2 heard, RRR.  Gastrointestinal system: Abdomen is nondistended, soft and nontender. Central nervous system: Alert and oriented Extremities: Bilateral lower extremity pitting edema, no clubbing ,no cyanosis Skin: No rashes, no ulcers,no icterus   GU: Foley, penile/scrotal edema  Data Reviewed: I have personally reviewed following labs and imaging studies  CBC: Recent Labs  Lab 11/27/21 0500 11/28/21 0405 11/29/21 0330  WBC 7.5 8.0 9.5  HGB 8.1* 8.0* 8.0*  HCT 24.5* 25.0* 24.5*  MCV 83.1 83.3 83.6  PLT 330 332 [id number]   Basic Metabolic Panel: Recent Labs  Lab 11/27/21 0500 11/28/21 0405 11/29/21 0330  NA 129* 132* 132*  K 4.1 4.5 4.2  CL 96* 98 95*  CO2 23 25 24   GLUCOSE 75 77 119*  BUN 118* 114* 112*  CREATININE 2.75* 2.79* 2.86*  CALCIUM 7.9* 8.4* 8.4*  MG  --  2.8* 2.7*     No results found for this or any previous visit (from the past 240 hour(s)).    Radiology Studies: No results found.  Scheduled Meds:  (feeding supplement) PROSource Plus   30 mL Oral TID BM   apixaban  5 mg Oral BID   artificial tears   Both Eyes QHS   vitamin C  500 mg Oral BID   atorvastatin  80 mg Oral QHS   chlorhexidine  15 mL Mouth Rinse BID   Chlorhexidine Gluconate Cloth  6 each Topical Q0600   finasteride  5 mg Oral Daily   insulin aspart  0-15 Units Subcutaneous  TID WC   insulin aspart  0-5 Units Subcutaneous QHS   insulin detemir  15 Units Subcutaneous Daily   ketotifen  1 drop Both Eyes BID   latanoprost  1 drop Both Eyes QHS   lidocaine  2 patch Transdermal Daily   mouth rinse  15 mL Mouth Rinse q12n4p   multivitamin with minerals  1 tablet Oral Daily   pantoprazole  40 mg Oral Q1200   polyethylene glycol  17 g Oral BID   sodium chloride flush  10-40 mL Intracatheter Q12H   sodium chloride flush  10-40 mL Intracatheter Q12H   tamsulosin  0.8 mg Oral QHS   zinc sulfate  220 mg Oral Daily   zolpidem  5 mg Oral QHS   Continuous Infusions:  sodium chloride 250 mL (11/02/21 0205)   sodium chloride 10 mL/hr at 11/27/21 0512    ceFAZolin (ANCEF) IV 2 g (12/02/21 2233)   DOBUTamine 2.5 mcg/kg/min (12/01/21 1335)     LOS: 32 days   Shelly Coss, MD [facility] P7/07/2021, 10:32 AM

## 2021-12-04 DIAGNOSIS — I5023 Acute on chronic systolic (congestive) heart failure: Secondary | ICD-10-CM | POA: Diagnosis not present

## 2021-12-04 DIAGNOSIS — Z7189 Other specified counseling: Secondary | ICD-10-CM | POA: Diagnosis not present

## 2021-12-04 DIAGNOSIS — R6521 Severe sepsis with septic shock: Secondary | ICD-10-CM | POA: Diagnosis not present

## 2021-12-04 DIAGNOSIS — Z515 Encounter for palliative care: Secondary | ICD-10-CM | POA: Diagnosis not present

## 2021-12-04 DIAGNOSIS — A419 Sepsis, unspecified organism: Secondary | ICD-10-CM | POA: Diagnosis not present

## 2021-12-04 MED ORDER — SODIUM CHLORIDE 0.9% FLUSH: 3.0000 mL | INTRAVENOUS | Status: DC | PRN

## 2021-12-04 MED ORDER — SODIUM CHLORIDE 0.9% FLUSH
3.0000 mL | Freq: Two times a day (BID) | INTRAVENOUS | Status: DC
  Administered 2021-12-04 – 2021-12-07 (×6): 3 mL via INTRAVENOUS

## 2021-12-04 NOTE — Plan of Care (Signed)
  Problem: Education: Goal: Ability to describe self-care measures that may prevent or decrease complications (Diabetes Survival Skills Education) will improve Outcome: Progressing Goal: Individualized Educational Video(s) Outcome: Progressing   Problem: Metabolic: Goal: Ability to maintain appropriate glucose levels will improve Outcome: Progressing   Problem: Nutritional: Goal: Maintenance of adequate nutrition will improve Outcome: Progressing Goal: Progress toward achieving an optimal weight will improve Outcome: Progressing   Problem: Skin Integrity: Goal: Risk for impaired skin integrity will decrease Outcome: Progressing   

## 2021-12-04 NOTE — Progress Notes (Signed)
PROGRESS NOTE  Glenn [name]  NTI:[id number] DOB: 1940/08/20 DOA: 10/04/2021 PCP: System, Provider Not In   Brief Narrative:  Patient is a 81 y.o. M with CAD s/p CABG 96, ICM and sCHF EF <20%, DM, CKD IIIb baseline 1.7, pAF on Eliquis, hx PPM and BPH who presented with 3 days weakness, back pain to [facility].  Patient does intermittent catheterization at home whenever he feels like doing usually every 48 hours.  On presentation he was hyportensive.  Lab work showed leukocytosis.  Creatinine was in the range of 3.6.  CT imaging showed distended bladder, discitis/osteomyelitis of the spine.  He was transferred to [facility], ICU for pressures.  Foley was placed for urinary retention.  MRI did not show any drainable focus, pressure started off, transferred out of ICU.  Underwent disc aspiration on 6/8 by IR, cultures showed Klebsiella.  ID was consulted and recommended antibiotics.  PT/OT recommended CIR on discharge.  Because of severe volume overload, IV Lasix was started.  Cardiology also consulted  for resistant anasarca.  Prolonged hospitalization due to anasarca not responding to diuresis.  Now palliative care following for goals of care, plan is to convert his care to comfort after one of his   daughters arrives from [place]( On Monday/Tuesday)  Assessment & Plan:  Principal Problem:   Septic shock (HCC) Active Problems:   Bacteremia due to Klebsiella pneumoniae   Discitis   Osteomyelitis of lumbar spine (HCC)   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HCC)   Bladder outlet obstruction   Acute kidney injury (HCC)   Uncontrolled type 2 diabetes mellitus with hyperglycemia, with long-term current use of insulin (HCC)   Pacemaker   Chronic kidney disease, stage 3b (HCC)   Coronary artery disease involving native coronary artery of native heart without angina pectoris   Chronic combined systolic and diastolic CHF (congestive heart failure) (HCC)   Hyponatremia   Hypokalemia   Transaminitis   Myocardial injury    Thrombocytopenia (HCC)   Anemia due to chronic kidney disease   Coagulopathy (HCC)   Protein-calorie malnutrition, moderate (HCC)   Paroxysmal atrial fibrillation (HCC)   Foot ulcer (HCC)   Acute on chronic systolic congestive heart failure (HCC)  Chronic combined systolic/diastolic congestive heart failure/volume overload: Known history of systolic congestive heart failure.  EF is less than 20%, has grade 2 diastolic dysfunction.  Status post ICD placement.  Looks severely volume overloaded  along with bilateral lower extremity edema, scrotal edema.patient was on IV Lasix.  Hypoalbuminemia is also contributing. Takes torsemide at home.  He follows with cardiology.  Also takes Entresto at home which is on hold.   On compression stockings for his bilateral lower extremity edema. Right heart cath with finding of normal left-sided pressures with elevated RAP, moderate pulmonary hypertension.  Started on dobutamine drip.  Septic shock/Klebsiella bacteremia/UTI: Presented to [facility] initially with back pain, weakness.  Found to be very hypotensive.  Septic shock suspected.  Started on broad-spectrum antibiotics.  Sent to ICU at [facility] with pressor support.  Currently hemodynamically stable Found to have osteomyelitis/discitis of lumbar spine, psoas abscess.  Underwent disc space drainage by IR.  Culture resulted Klebsiella.  ID was consulted.  PICC line placed.  Recommended cefazolin till 7/18.  Sepsis physiology has resolved.  Leukocytosis resolved.  AKI on CKD stage IIIb: Baseline creatinine arounfd 1.8.  Presented with creatinine in the range of 3.6.  Kidney function fluctuating.   Coronary artery disease: No anginal symptoms.  On Lipitor, beta-blocker at home now on hold  Paroxysmal A-fib: Currently rate is controlled.  On Eliquis for anticoagulation.  Monitor on telemetry.  Was on Coreg for rate control, currently on hold due to hypotension  Bladder outlet obstruction: On presentation, imaging showed  distended bladder.  Takes finasteride, Flomax at home.  Does self cath at home.  Foley was placed here.   .  Type 2 diabetes/hyperglycemia: Last hemoglobin A1c of more than 10.  Continue current insulin regimen.  Monitor blood sugars  Normocytic anemia:   Hemoglobin was in the range of 11 few weeks ago.Now trended down to 7-8.  No evidence of acute blood loss.  He is on Eliquis.  Negative FOBT.  Denies any hematochezia or melena.    Moderate malnutrition: Nutritionist following  Deconditioning/debility: PT/OT recommended CIR on discharge.  Goals of care: Palliative care consulted due to persistent anasarca not responding to medications.  CODE STATUS is DNR.  Extensive discussion at the bedside again today.  He wants to be comfortable and wants  himself transitioned to comfort care  but would like to meet her daughter before that.  She is in [place] and will be here on Monday    Nutrition Problem: Moderate Malnutrition Etiology: chronic illness (CHF) Pressure Injury 11/02/21 Foot Left;Lateral Deep Tissue Pressure Injury - Purple or maroon localized area of discolored intact skin or blood-filled blister due to damage of underlying soft tissue from pressure and/or shear. 5.5x4 (Active)  11/02/21   Location: Foot  Location Orientation: Left;Lateral  Staging: Deep Tissue Pressure Injury - Purple or maroon localized area of discolored intact skin or blood-filled blister due to damage of underlying soft tissue from pressure and/or shear.  Wound Description (Comments): 5.5x4  Present on Admission: Yes  Dressing Type Foam - Lift dressing to assess site every shift 12/03/21 2015    DVT prophylaxis:Place TED hose Start: 11/23/21 1011 Place and maintain sequential compression device Start: 11/02/21 1108 apixaban (ELIQUIS) tablet 5 mg     Code Status: DNR  Family Communication: Called daughter  [name] and discussed on phone on 6/30  Patient status:Inpatient  Patient is from  :Home  Anticipated discharge to: Not sure  Estimated DC date:  Plan for transitioning his care to comfort.   Consultants: PCCM, IR, ID,palliative care  Procedures: Dx aspiration,right heart cath  Antimicrobials:  Anti-infectives (From admission, onward)    Start     Dose/Rate Route Frequency Ordered Stop   11/23/21 2200  ceFAZolin (ANCEF) IVPB 2g/100 mL premix        2 g 200 mL/hr over 30 Minutes Intravenous Every 12 hours 11/23/21 0918     11/22/21 1400  ceFAZolin (ANCEF) IVPB 2g/100 mL premix  Status:  Discontinued        2 g 200 mL/hr over 30 Minutes Intravenous Every 8 hours 11/22/21 0828 11/23/21 0918   11/20/21 2000  ceFAZolin (ANCEF) IVPB 2g/100 mL premix  Status:  Discontinued        2 g 200 mL/hr over 30 Minutes Intravenous Every 12 hours 11/20/21 1230 11/22/21 0828   11/20/21 1600  ceFAZolin (ANCEF) IVPB 2g/100 mL premix  Status:  Discontinued        2 g 200 mL/hr over 30 Minutes Intravenous Every 8 hours 11/20/21 1209 11/20/21 1230   11/12/21 1800  ceFAZolin (ANCEF) IVPB 2g/100 mL premix  Status:  Discontinued        2 g 200 mL/hr over 30 Minutes Intravenous Every 12 hours 11/12/21 0936 11/20/21 1209   11/04/21 1400  ceFAZolin (ANCEF) IVPB 2g/100  mL premix  Status:  Discontinued        2 g 200 mL/hr over 30 Minutes Intravenous Every 8 hours 11/04/21 1058 11/12/21 0936   11/03/21 1800  vancomycin (VANCOCIN) IVPB 1000 mg/200 mL premix  Status:  Discontinued        1,000 mg 200 mL/hr over 60 Minutes Intravenous Every 48 hours 11/02/21 0259 11/02/21 0452   11/02/21 2200  cefTRIAXone (ROCEPHIN) 2 g in sodium chloride 0.9 % 100 mL IVPB  Status:  Discontinued        2 g 200 mL/hr over 30 Minutes Intravenous Every 24 hours 11/02/21 0452 11/04/21 1058   11/02/21 0230  ceFEPIme (MAXIPIME) 2 g in sodium chloride 0.9 % 100 mL IVPB  Status:  Discontinued        2 g 200 mL/hr over 30 Minutes Intravenous Every 24 hours 11/02/21 0131 11/02/21 0452       Subjective:  Patient  seen and examined at the bedside this morning.  Appears comfortable without any complaints.  On room air.  Waiting for the daughter's arrival today  Objective: Vitals:   12/03/21 1600 12/03/21 2015 12/03/21 2300 12/04/21 0330  BP:  118/88 110/74 107/71  Pulse: 96 100 97 100  Resp: (!) 22 (!) 9 14 17   Temp:  98.2 F (36.8 C) 98.2 F (36.8 C) 97.8 F (36.6 C)  TempSrc:  Oral Oral Oral  SpO2:  97% 100% 98%  Weight:      Height:        Intake/Output Summary (Last 24 hours) at 12/04/2021 1051 Last data filed at 12/04/2021 0952 Gross per 24 hour  Intake 324.65 ml  Output 450 ml  Net -125.35 ml   Filed Weights   11/27/21 0705 11/28/21 0630 11/29/21 0319  Weight: 75.5 kg 69 kg 73.1 kg    Examination:   General exam: Very deconditioned, chronically ill looking HEENT: PERRL Respiratory system: Diminished air sounds bilaterally, fine crackles on bases Cardiovascular system: S1 & S2 heard, RRR.  Gastrointestinal system: Abdomen is nondistended, soft and nontender. Central nervous system: Alert and oriented Extremities: Bilateral lower extremity pitting edema Skin: No rashes, no ulcers,no icterus   GU: Foley, penile, scrotal edema  Data Reviewed: I have personally reviewed following labs and imaging studies  CBC: Recent Labs  Lab 11/28/21 0405 11/29/21 0330  WBC 8.0 9.5  HGB 8.0* 8.0*  HCT 25.0* 24.5*  MCV 83.3 83.6  PLT 332 322   Basic Metabolic Panel: Recent Labs  Lab 11/28/21 0405 11/29/21 0330  NA 132* 132*  K 4.5 4.2  CL 98 95*  CO2 25 24  GLUCOSE 77 119*  BUN 114* 112*  CREATININE 2.79* 2.86*  CALCIUM 8.4* 8.4*  MG 2.8* 2.7*     No results found for this or any previous visit (from the past 240 hour(s)).    Radiology Studies: DG CHEST PORT 1 VIEW  Result Date: 12/04/2021 CLINICAL DATA:  Check PICC placement EXAM: PORTABLE CHEST 1 VIEW COMPARISON:  11/02/2021 FINDINGS: Cardiac shadow is stable. Defibrillator and postsurgical changes are again noted  and stable. Right-sided PICC is seen with the tip at the cavoatrial junction. No focal infiltrate is seen. Elevation of the right hemidiaphragm is seen. IMPRESSION: PICC placement in satisfactory position. Electronically Signed   By: 01/02/2022 M.D.   On: 12/04/2021 00:07    Scheduled Meds:  (feeding supplement) PROSource Plus  30 mL Oral TID BM   apixaban  5 mg Oral BID   artificial  tears   Both Eyes QHS   vitamin C  500 mg Oral BID   atorvastatin  80 mg Oral QHS   chlorhexidine  15 mL Mouth Rinse BID   Chlorhexidine Gluconate Cloth  6 each Topical Q0600   finasteride  5 mg Oral Daily   insulin detemir  15 Units Subcutaneous Daily   ketotifen  1 drop Both Eyes BID   latanoprost  1 drop Both Eyes QHS   lidocaine  2 patch Transdermal Daily   mouth rinse  15 mL Mouth Rinse q12n4p   pantoprazole  40 mg Oral Q1200   polyethylene glycol  17 g Oral BID   sodium chloride flush  10-40 mL Intracatheter Q12H   sodium chloride flush  10-40 mL Intracatheter Q12H   sodium chloride flush  3 mL Intravenous Q12H   tamsulosin  0.8 mg Oral QHS   zolpidem  5 mg Oral QHS   Continuous Infusions:  sodium chloride 250 mL (11/02/21 0205)   sodium chloride 10 mL/hr at 11/27/21 0512    ceFAZolin (ANCEF) IV 2 g (12/04/21 1000)   DOBUTamine 2.5 mcg/kg/min (12/04/21 0639)     LOS: 33 days   Shelly Coss, MD [facility] P7/08/2021, 10:51 AM

## 2021-12-04 NOTE — Progress Notes (Signed)
Dressing changed on Picc line in upper right arm. Patient tolerated procedure well done under sterile procedure. Mask was worn by patient and 2 RN's. Chest Xray was done to verify the location of the tip of the line. Also IV team came to verify the line was not dislodged or pulled out of place. Line was cleared by XRay. Dobutamine is fusing as ordered.

## 2021-12-04 NOTE — Progress Notes (Addendum)
Palliative:  HPI: 81 y.o. male  with past medical history of CHF, s/p Chemical engineer PPM/ICD, CAD, IDDM, CKD stage 3b, prostatomegaly admitted on 10/05/2021 with weakness x 3 days after fall followed with pain, difficulty to ambulate, decrease intake and urine output. Noted to have hypotension, leukocytosis related to discitis aspiration showing +Klebsiella along with UTI. Hospitalization complicated by cardiogenic shock EF <66%, grade 2 diastolic dysfunction, moderate pulmonary hypertension, renal failure, bladder outlet obstruction, paroxysmal atrial fibrillation. Ongoing significant fluid overload and poor response to diuresis. Trial large dose of Lasix while on dobutamine.    I met today at Glenn [name]' bedside but no family present. Glenn [name] continues to talk of his peace and anticipation of transition to comfort care. He reports that the pain medication helps - although it seems that he is not using it as much as he needs. I reminded him that he can have the pain medication as needed to give him relief. He will try and eat some lunch and then he wishes to try and get some sleep before his family arrive later this evening.   I called and spoke with [name]. [name] shares that her sister is set to arrive to the hospital ~1030/11pm tonight. [name] shares that her sister does not know the situation and still thinks that he is awaiting to go to rehab. [name] expresses concern about how her sister will process this information. [name] is hopeful that they can have time as a family. We discussed timing of transition to full comfort and will focus on comfort as a priority and I will visit with them first thing of my shift in the morning to assist with transition to full comfort and support.   All questions/concerns addressed. Emotional support provided. Discussed with RN and Dr. Tawanna Solo.   Exam: Awake, alert. No distress. Generalized weakness and fatigue. Breathing regular, unlabored. Generalized  pain at times. Abd soft.   Plan: - DNR, AICD deactivated.  - Continue PRN medication for comfort.  - Plans for full comfort transition in the morning.   [place], NP Palliative Medicine Team Pager [phone] (Please see [email or web address] for schedule) Team Phone [phone]    Greater than 50%  of this time was spent counseling and coordinating care related to the above assessment and plan

## 2021-12-04 NOTE — Progress Notes (Signed)
Progress Note  Patient Name: Glenn [name] Date of Encounter: 12/04/2021  [facility]: [facility]  Subjective   Device turned off in interim. No issues overnight. He has may his peace; he wanted to me monitored inpatient after the dobutamine was stopped and is happy that people were ok with this.  Inpatient Medications    Scheduled Meds:  (feeding supplement) PROSource Plus  30 mL Oral TID BM   apixaban  5 mg Oral BID   artificial tears   Both Eyes QHS   vitamin C  500 mg Oral BID   atorvastatin  80 mg Oral QHS   chlorhexidine  15 mL Mouth Rinse BID   Chlorhexidine Gluconate Cloth  6 each Topical Q0600   finasteride  5 mg Oral Daily   insulin detemir  15 Units Subcutaneous Daily   ketotifen  1 drop Both Eyes BID   latanoprost  1 drop Both Eyes QHS   lidocaine  2 patch Transdermal Daily   mouth rinse  15 mL Mouth Rinse q12n4p   pantoprazole  40 mg Oral Q1200   polyethylene glycol  17 g Oral BID   sodium chloride flush  10-40 mL Intracatheter Q12H   sodium chloride flush  10-40 mL Intracatheter Q12H   tamsulosin  0.8 mg Oral QHS   zolpidem  5 mg Oral QHS   Continuous Infusions:  sodium chloride 250 mL (11/02/21 0205)   sodium chloride 10 mL/hr at 11/27/21 0512    ceFAZolin (ANCEF) IV 2 g (12/03/21 2253)   DOBUTamine 2.5 mcg/kg/min (12/04/21 0639)   PRN Meds: acetaminophen, ALPRAZolam, hydrALAZINE, HYDROmorphone (DILAUDID) injection, ipratropium-albuterol, ondansetron (ZOFRAN) IV, oxyCODONE, polyvinyl alcohol, senna-docusate   Vital Signs    Vitals:   12/03/21 1600 12/03/21 2015 12/03/21 2300 12/04/21 0330  BP:  118/88 110/74 107/71  Pulse: 96 100 97 100  Resp: (!) 22 (!) 9 14 17   Temp:  98.2 F (36.8 C) 98.2 F (36.8 C) 97.8 F (36.6 C)  TempSrc:  Oral Oral Oral  SpO2:  97% 100% 98%  Weight:      Height:        Intake/Output Summary (Last 24 hours) at 12/04/2021 0750 Last data filed at 12/04/2021 02/04/2022 Gross per 24 hour  Intake 314.65 ml  Output  450 ml  Net -135.35 ml      11/29/2021    3:19 AM 11/28/2021    6:30 AM 11/27/2021    7:05 AM  Last 3 Weights  Weight (lbs) 161 lb 2.5 oz 152 lb 1.9 oz 166 lb 7.2 oz  Weight (kg) 73.1 kg 69 kg 75.5 kg      Telemetry    NONE- Personally Reviewed  Physical Exam   GEN: No acute distress Neck: Positive JVD to the mid neck Cardiac: RRR, 2/6 systolic murmur left sternal border Respiratory: Clear to auscultation bilaterally. GI: Soft, nontender, non-distended  MS: 2+ edema Neuro:  Nonfocal  Psych: Normal affect   Labs   Chemistry Recent Labs  Lab 11/28/21 0405 11/29/21 0330  NA 132* 132*  K 4.5 4.2  CL 98 95*  CO2 25 24  GLUCOSE 77 119*  BUN 114* 112*  CREATININE 2.79* 2.86*  CALCIUM 8.4* 8.4*  MG 2.8* 2.7*  GFRNONAA 22* 22*  ANIONGAP 9 13     Hematology Recent Labs  Lab 11/28/21 0405 11/29/21 0330  WBC 8.0 9.5  RBC 3.00* 2.93*  HGB 8.0* 8.0*  HCT 25.0* 24.5*  MCV 83.3 83.6  MCH 26.7 27.3  MCHC 32.0 32.7  RDW 17.4* 17.8*  PLT 332 322     Patient Profile     81 y.o. male with a history of CAD s/p remote CABG in 1996, chronic combined CHF with EF of 20% s/p [employer] ICD, paroxysmal atrial fibrillation on Eliquis, hypertension, type 2 diabetes mellitus, CKD stage III who presented to [facility] on 10/31/2021 with generalized weakness and back pain and was found to have septic shock secondary to urosepsis with hydronephrosis and AKI as well as L4-L5  osteomyelitis/discitis. He was transferred to [facility] where he was admitted to medical ICU. He underwent disc aspiration on 6/8 by IR which grew Klebsiella. He was treated with IV fluids per sepsis protocol and then went into acute CHF. He was started on IV Lasix but remained significant volume overloaded. Therefore, Cardiology was consulted for assistance on 11/20/2021.  Echocardiogram June 2023 showed severe LV dysfunction with ejection fraction less than 20%, grade 2 diastolic dysfunction, mild mitral  regurgitation, moderate tricuspid regurgitation.  Right heart catheterization June 2023 showed RA pressure 12, pulmonary artery pressure 62/18 and pulmonary capillary wedge pressure of 13.  Assessment & Plan    Acute on chronic systolic congestive heart failure Anasarca Acute on chronic CKD stage IIIb - patients daughter is coming from [place], planned to arrive today ~ 10 pm before transition to comfort care (no further blood draws, no dobutamine)  Paroxysmal atrial fibrillation -continue apixaban for now; planned for DC when CC transition  Status post ICD ([employer])- - therapies turned off  We can see tomorrow if no transition to comfort care  For questions or updates, please contact [facility] Please consult [email or web address] for contact info under        Signed, Christell Constant, MD  12/04/2021, 7:50 AM

## 2021-12-05 DIAGNOSIS — R6521 Severe sepsis with septic shock: Secondary | ICD-10-CM | POA: Diagnosis not present

## 2021-12-05 DIAGNOSIS — A419 Sepsis, unspecified organism: Secondary | ICD-10-CM | POA: Diagnosis not present

## 2021-12-05 DIAGNOSIS — I5023 Acute on chronic systolic (congestive) heart failure: Secondary | ICD-10-CM | POA: Diagnosis not present

## 2021-12-05 DIAGNOSIS — Z7189 Other specified counseling: Secondary | ICD-10-CM | POA: Diagnosis not present

## 2021-12-05 DIAGNOSIS — Z515 Encounter for palliative care: Secondary | ICD-10-CM | POA: Diagnosis not present

## 2021-12-05 MED ORDER — BIOTENE DRY MOUTH MT LIQD: 15.0000 mL | OROMUCOSAL | Status: DC | PRN

## 2021-12-05 MED ORDER — ACETAMINOPHEN 325 MG PO TABS: 650.0000 mg | ORAL_TABLET | Freq: Four times a day (QID) | ORAL | Status: DC | PRN

## 2021-12-05 MED ORDER — HYDROMORPHONE BOLUS VIA INFUSION
0.5000 mg | INTRAVENOUS | Status: DC | PRN
  Administered 2021-12-06: 0.5 mg via INTRAVENOUS
  Filled 2021-12-05: qty 1

## 2021-12-05 MED ORDER — GLYCOPYRROLATE 0.2 MG/ML IJ SOLN
0.4000 mg | Freq: Three times a day (TID) | INTRAMUSCULAR | Status: DC
  Administered 2021-12-05 – 2021-12-07 (×7): 0.4 mg via INTRAVENOUS
  Filled 2021-12-05 (×7): qty 2

## 2021-12-05 MED ORDER — SODIUM CHLORIDE 0.9 % IV SOLN
0.5000 mg/h | INTRAVENOUS | Status: DC
  Administered 2021-12-05 – 2021-12-06 (×2): 0.5 mg/h via INTRAVENOUS
  Filled 2021-12-05 (×2): qty 2.5

## 2021-12-05 MED ORDER — ACETAMINOPHEN 650 MG RE SUPP: 650.0000 mg | Freq: Four times a day (QID) | RECTAL | Status: DC | PRN

## 2021-12-05 MED ORDER — LORAZEPAM 2 MG/ML IJ SOLN: 0.5000 mg | INTRAMUSCULAR | Status: DC | PRN

## 2021-12-05 MED ORDER — OXYCODONE HCL 5 MG PO TABS
5.0000 mg | ORAL_TABLET | ORAL | Status: DC | PRN
  Filled 2021-12-05: qty 1

## 2021-12-05 NOTE — Progress Notes (Signed)
I responded to a page from the nurse to provide spiritual care for the patient and his family. I arrived at the patient's room where his wife and two daughters were present. I provided spiritual care through pastoral presence, sharing words of encouragement and comfort, reading scripture, and by leading in prayer.     12/05/21 1107  Clinical Encounter Type  Visited With Patient and family together  Visit Type Initial;Spiritual support  Referral From Nurse  Consult/Referral To Chaplain  Spiritual Encounters  Spiritual Needs Sacred text;Prayer    Chaplain Dr Melvyn Novas

## 2021-12-05 NOTE — Progress Notes (Signed)
Palliative:  HPI: 81 y.o. male  with past medical history of CHF, s/p Chemical engineer PPM/ICD, CAD, IDDM, CKD stage 3b, prostatomegaly admitted on 10/03/2021 with weakness x 3 days after fall followed with pain, difficulty to ambulate, decrease intake and urine output. Noted to have hypotension, leukocytosis related to discitis aspiration showing +Klebsiella along with UTI. Hospitalization complicated by cardiogenic shock EF <17%, grade 2 diastolic dysfunction, moderate pulmonary hypertension, renal failure, bladder outlet obstruction, paroxysmal atrial fibrillation. Ongoing significant fluid overload and poor response to diuresis. Trial large dose of Lasix while on dobutamine.    I met today with Mr. Remo and his wife and 2 daughters. All family are here now and Mr. Briner has been able to share with them his desires and his peace with transition to end of life. I explained to the the next steps and process of stopping prolonging measures and treatment and focusing on symptoms and rest. We discussed use of PRN medications and will add low dose dilaudid infusion to begin this evening to ensure his comfort moving forward. Dilaudid infusion can be initiated prior to the evening if needed (discussed plans with RN Pamala Hurry). Patient and family all agree with medication plan to ensure rest and comfort. We discussed overall poor prognosis as hours to days. They would like chaplain visit and prayer today. Wife especially had questions about symptoms and expectations during the dying process which I explained what is normal expectations and what are signs of discomfort. I reassured them that the nurses, myself, and medical team will help them through this process and explain where we are along the way and at anytime they have questions just to ask the nurse or call me.   All questions/concerns addressed. Emotional support provided. I left family to have some time to themselves.   Exam: Awake, alert. No  distress. Generalized weakness and fatigue. Breathing regular, unlabored. Generalized pain at times. Abd soft.  Plan: - DNR, AICD already deactivated.  - Full comfort care.  - Dilaudid infusion to begin this evening. PRN medication in the meantime. This is with hopes of allowing more quality time with family today.   [place], NP Palliative Medicine Team Pager [phone] (Please see [email or web address] for schedule) Team Phone [phone]    Greater than 50%  of this time was spent counseling and coordinating care related to the above assessment and plan

## 2021-12-05 NOTE — Progress Notes (Signed)
1040  Family arrived, patient called RN to turn off dobutamine. Dobutamine turned off, hydromorphone 25 mg IVPB hung but not started. Ready for when patient calls.  1110  Visit from chaplin  1400 Patient states will be ready to start pain medications at 1430  1430 Hydromorphone started at 1 mg per hour. Patient comfortable visiting with daughters and wife.  1645 Patient eating some of his dinner tray. 150ccs of urine in foley bag since 7/3 0700  Report to [employer], patient sitting up in the bed visiting with family, states he is doing fine, hydromorphone running at 1mg /hr. Patient will take oxy 10 mg with PM meds.

## 2021-12-05 NOTE — Progress Notes (Signed)
PROGRESS NOTE  Glenn [name]  [id number] DOB: 1940-10-07 DOA: 10/17/2021 PCP: System, Provider Not In   Brief Narrative:  Patient is a 81 y.o. M with CAD s/p CABG 96, ICM and sCHF EF <20%, DM, CKD IIIb baseline 1.7, pAF on Eliquis, hx PPM and BPH who presented with 3 days weakness, back pain to [facility].  Patient does intermittent catheterization at home whenever he feels like doing usually every 48 hours.  On presentation he was hyportensive.  Lab work showed leukocytosis.  Creatinine was in the range of 3.6.  CT imaging showed distended bladder, discitis/osteomyelitis of the spine.  He was transferred to [facility], ICU for pressures.  Foley was placed for urinary retention.  MRI did not show any drainable focus, pressure started off, transferred out of ICU.  Underwent disc aspiration on 6/8 by IR, cultures showed Klebsiella.  ID was consulted and recommended antibiotics.  PT/OT recommended CIR on discharge.  Because of severe volume overload, IV Lasix was started.  Cardiology also consulted  for resistant anasarca.  Prolonged hospitalization due to anasarca not responding to diuresis.  Palliative care consulted for goals of care, converted to full comfort care today.  Assessment & Plan:  Principal Problem:   Septic shock ([place]) Active Problems:   Bacteremia due to Klebsiella pneumoniae   Discitis   Osteomyelitis of lumbar spine (HCC)   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HCC)   Bladder outlet obstruction   Acute kidney injury ([place])   Uncontrolled type 2 diabetes mellitus with hyperglycemia, with long-term current use of insulin (HCC)   Pacemaker   Chronic kidney disease, stage 3b (HCC)   Coronary artery disease involving native coronary artery of native heart without angina pectoris   Chronic combined systolic and diastolic CHF (congestive heart failure) (HCC)   Hyponatremia   Hypokalemia   Transaminitis   Myocardial injury   Thrombocytopenia (HCC)   Anemia due to chronic kidney disease   Coagulopathy  (HCC)   Protein-calorie malnutrition, moderate (HCC)   Paroxysmal atrial fibrillation (HCC)   Foot ulcer ([place])   Acute on chronic systolic congestive heart failure (HCC)  Chronic combined systolic/diastolic congestive heart failure/volume overload: Known history of systolic congestive heart failure.  EF is less than 20%, has grade 2 diastolic dysfunction.  Status post ICD placement.  Looked  severely volume overloaded  along with bilateral lower extremity edema, scrotal edema.did not respond to aggressive IV Lasix right heart cath with finding of normal left-sided pressures with elevated RAP, moderate pulmonary hypertension.  Was also on dobutamine drip.  On comfort care.  AICD deactivated  Septic shock/Klebsiella bacteremia/UTI: Presented to [facility] initially with back pain, weakness.  Found to be very hypotensive.  Septic shock suspected.  Started on broad-spectrum antibiotics.  Sent to ICU at [facility] with pressor support.  Currently hemodynamically stable Found to have osteomyelitis/discitis of lumbar spine, psoas abscess.  Underwent disc space drainage by IR.  Culture resulted Klebsiella.  ID was consulted.  PICC line placed.  Recommended cefazolin till 7/18.  Sepsis physiology has resolved.  Leukocytosis resolved. Now on comfort care  AKI on CKD stage IIIb: Baseline creatinine arounfd 1.8.  Presented with creatinine in the range of 3.6.  On comfort care  Coronary artery disease: No anginal symptoms.  On Lipitor, beta-blocker at home now on hold.  On comfort care  Paroxysmal A-fib: Currently rate is controlled.  On Eliquis for anticoagulation.  Monitor on telemetry.  Was on Coreg for rate control, currently on hold due to hypotension.  On comfort  care  Bladder outlet obstruction: On presentation, imaging showed distended bladder.  Takes finasteride, Flomax at home.  Does self cath at home.  Foley was placed here.   .  Type 2 diabetes/hyperglycemia: Last hemoglobin A1c of more than 10.     Normocytic anemia:   Hemoglobin was in the range of 11 few weeks ago.Now trended down to 7-8.  No evidence of acute blood loss.  .    Deconditioning/debility: PT/OT recommended CIR on discharge.  Now on comfort care  Goals of care: Palliative care consulted due to persistent anasarca not responding to medications.  On comfort care   Nutrition Problem: Moderate Malnutrition Etiology: chronic illness (CHF) Pressure Injury 11/02/21 Foot Left;Lateral Deep Tissue Pressure Injury - Purple or maroon localized area of discolored intact skin or blood-filled blister due to damage of underlying soft tissue from pressure and/or shear. 5.5x4 (Active)  11/02/21   Location: Foot  Location Orientation: Left;Lateral  Staging: Deep Tissue Pressure Injury - Purple or maroon localized area of discolored intact skin or blood-filled blister due to damage of underlying soft tissue from pressure and/or shear.  Wound Description (Comments): 5.5x4  Present on Admission: Yes  Dressing Type Foam - Lift dressing to assess site every shift 12/04/21 1945    DVT prophylaxis:Place and maintain sequential compression device Start: 11/02/21 1108     Code Status: DNR  Family Communication: Discussed with daughter at bedside  Patient status:Inpatient  Patient is from :Home  Anticipated discharge to: Not sure  Estimated DC date: Currently on comfort care.  We will check with palliative care if we seek residential hospice placement  Consultants: PCCM, IR, ID,palliative care  Procedures: Dx aspiration,right heart cath  Antimicrobials:  Anti-infectives (From admission, onward)    Start     Dose/Rate Route Frequency Ordered Stop   11/23/21 2200  ceFAZolin (ANCEF) IVPB 2g/100 mL premix  Status:  Discontinued        2 g 200 mL/hr over 30 Minutes Intravenous Every 12 hours 11/23/21 0918 12/05/21 1028   11/22/21 1400  ceFAZolin (ANCEF) IVPB 2g/100 mL premix  Status:  Discontinued        2 g 200 mL/hr over 30  Minutes Intravenous Every 8 hours 11/22/21 0828 11/23/21 0918   11/20/21 2000  ceFAZolin (ANCEF) IVPB 2g/100 mL premix  Status:  Discontinued        2 g 200 mL/hr over 30 Minutes Intravenous Every 12 hours 11/20/21 1230 11/22/21 0828   11/20/21 1600  ceFAZolin (ANCEF) IVPB 2g/100 mL premix  Status:  Discontinued        2 g 200 mL/hr over 30 Minutes Intravenous Every 8 hours 11/20/21 1209 11/20/21 1230   11/12/21 1800  ceFAZolin (ANCEF) IVPB 2g/100 mL premix  Status:  Discontinued        2 g 200 mL/hr over 30 Minutes Intravenous Every 12 hours 11/12/21 0936 11/20/21 1209   11/04/21 1400  ceFAZolin (ANCEF) IVPB 2g/100 mL premix  Status:  Discontinued        2 g 200 mL/hr over 30 Minutes Intravenous Every 8 hours 11/04/21 1058 11/12/21 0936   11/03/21 1800  vancomycin (VANCOCIN) IVPB 1000 mg/200 mL premix  Status:  Discontinued        1,000 mg 200 mL/hr over 60 Minutes Intravenous Every 48 hours 11/02/21 0259 11/02/21 0452   11/02/21 2200  cefTRIAXone (ROCEPHIN) 2 g in sodium chloride 0.9 % 100 mL IVPB  Status:  Discontinued        2  g 200 mL/hr over 30 Minutes Intravenous Every 24 hours 11/02/21 0452 11/04/21 1058   11/02/21 0230  ceFEPIme (MAXIPIME) 2 g in sodium chloride 0.9 % 100 mL IVPB  Status:  Discontinued        2 g 200 mL/hr over 30 Minutes Intravenous Every 24 hours 11/02/21 0131 11/02/21 0452       Subjective:  Patient seen and examined at the bedside this morning.  Hemodynamically stable.  Lying comfortably in bed.  Daughter at the bedside.  Objective: Vitals:   12/04/21 0330 12/04/21 0745 12/04/21 2350 12/05/21 0357  BP: 107/71 99/72 118/68 109/65  Pulse: 100 96    Resp: 17 (!) 24 18 18   Temp: 97.8 F (36.6 C) 98.8 F (37.1 C) 97.7 F (36.5 C) 98.3 F (36.8 C)  TempSrc: Oral Oral Oral Oral  SpO2: 98% 98%    Weight:      Height:        Intake/Output Summary (Last 24 hours) at 12/05/2021 1053 Last data filed at 12/04/2021 1945 Gross per 24 hour  Intake 200 ml   Output --  Net 200 ml   Filed Weights   11/27/21 0705 11/28/21 0630 11/29/21 0319  Weight: 75.5 kg 69 kg 73.1 kg    Examination:   General exam: Overall comfortable, not in distress, very deconditioned, chronically ill looking Respiratory system: Mild bibasilar crackles Cardiovascular system: S1 & S2 heard, RRR.  Gastrointestinal system: Abdomen is nondistended, soft and nontender. Central nervous system: Alert and oriented Extremities: Bilateral lower extremity pitting edema Skin: No rashes, no ulcers,no icterus   GU: foley, penile/scrotal edema  Data Reviewed: I have personally reviewed following labs and imaging studies  CBC: Recent Labs  Lab 11/29/21 0330  WBC 9.5  HGB 8.0*  HCT 24.5*  MCV 83.6  PLT 322   Basic Metabolic Panel: Recent Labs  Lab 11/29/21 0330  NA 132*  K 4.2  CL 95*  CO2 24  GLUCOSE 119*  BUN 112*  CREATININE 2.86*  CALCIUM 8.4*  MG 2.7*     No results found for this or any previous visit (from the past 240 hour(s)).    Radiology Studies: DG CHEST PORT 1 VIEW  Result Date: 12/04/2021 CLINICAL DATA:  Check PICC placement EXAM: PORTABLE CHEST 1 VIEW COMPARISON:  11/02/2021 FINDINGS: Cardiac shadow is stable. Defibrillator and postsurgical changes are again noted and stable. Right-sided PICC is seen with the tip at the cavoatrial junction. No focal infiltrate is seen. Elevation of the right hemidiaphragm is seen. IMPRESSION: PICC placement in satisfactory position. Electronically Signed   By: 01/02/2022 M.D.   On: 12/04/2021 00:07    Scheduled Meds:  (feeding supplement) PROSource Plus  30 mL Oral TID BM   artificial tears   Both Eyes QHS   chlorhexidine  15 mL Mouth Rinse BID   Chlorhexidine Gluconate Cloth  6 each Topical Q0600   glycopyrrolate  0.4 mg Intravenous TID   ketotifen  1 drop Both Eyes BID   latanoprost  1 drop Both Eyes QHS   lidocaine  2 patch Transdermal Daily   mouth rinse  15 mL Mouth Rinse q12n4p   sodium  chloride flush  10-40 mL Intracatheter Q12H   sodium chloride flush  10-40 mL Intracatheter Q12H   sodium chloride flush  3 mL Intravenous Q12H   tamsulosin  0.8 mg Oral QHS   Continuous Infusions:  sodium chloride 250 mL (11/02/21 0205)   sodium chloride 10 mL/hr at 11/27/21 [phone]  HYDROmorphone       LOS: 34 days   Shelly Coss, MD [facility] P7/09/2021, 10:53 AM

## 2021-12-05 NOTE — Progress Notes (Signed)
Progress Note  Patient Name: Glenn [name] Date of Encounter: 12/05/2021  [facility]ologist: [facility]  Subjective   Remains on low dose DBA.  No acute events overnight.  Daughter returned from [place] and was able to visit with patient last night.  Overall, he feels fairly well with only mild dyspnea; denies orthopnea or chest pain.  No other complaints overnight.  I/O -2492  Inpatient Medications    Scheduled Meds:  (feeding supplement) PROSource Plus  30 mL Oral TID BM   apixaban  5 mg Oral BID   artificial tears   Both Eyes QHS   vitamin C  500 mg Oral BID   atorvastatin  80 mg Oral QHS   chlorhexidine  15 mL Mouth Rinse BID   Chlorhexidine Gluconate Cloth  6 each Topical Q0600   finasteride  5 mg Oral Daily   insulin detemir  15 Units Subcutaneous Daily   ketotifen  1 drop Both Eyes BID   latanoprost  1 drop Both Eyes QHS   lidocaine  2 patch Transdermal Daily   mouth rinse  15 mL Mouth Rinse q12n4p   pantoprazole  40 mg Oral Q1200   polyethylene glycol  17 g Oral BID   sodium chloride flush  10-40 mL Intracatheter Q12H   sodium chloride flush  10-40 mL Intracatheter Q12H   sodium chloride flush  3 mL Intravenous Q12H   tamsulosin  0.8 mg Oral QHS   zolpidem  5 mg Oral QHS   Continuous Infusions:  sodium chloride 250 mL (11/02/21 0205)   sodium chloride 10 mL/hr at 11/27/21 0512    ceFAZolin (ANCEF) IV 2 g (12/04/21 2237)   DOBUTamine 2.5 mcg/kg/min (12/04/21 2241)   PRN Meds: acetaminophen, ALPRAZolam, hydrALAZINE, HYDROmorphone (DILAUDID) injection, ipratropium-albuterol, ondansetron (ZOFRAN) IV, oxyCODONE, polyvinyl alcohol, senna-docusate, sodium chloride flush   Vital Signs    Vitals:   12/04/21 0330 12/04/21 0745 12/04/21 2350 12/05/21 0357  BP: 107/71 99/72 118/68 109/65  Pulse: 100 96    Resp: 17 (!) 24 18 18   Temp: 97.8 F (36.6 C) 98.8 F (37.1 C) 97.7 F (36.5 C) 98.3 F (36.8 C)  TempSrc: Oral Oral Oral Oral  SpO2: 98% 98%     Weight:      Height:        Intake/Output Summary (Last 24 hours) at 12/05/2021 02/05/2022 Last data filed at 12/04/2021 1945 Gross per 24 hour  Intake 210 ml  Output 100 ml  Net 110 ml      11/29/2021    3:19 AM 11/28/2021    6:30 AM 11/27/2021    7:05 AM  Last 3 Weights  Weight (lbs) 161 lb 2.5 oz 152 lb 1.9 oz 166 lb 7.2 oz  Weight (kg) 73.1 kg 69 kg 75.5 kg      Telemetry    NONE- Personally Reviewed  Physical Exam   GEN: No acute distress Neck: Positive JVD to the mid neck Cardiac: RRR, 2/6 systolic murmur left sternal border Respiratory: Mild crackles at bases GI: Soft, nontender, non-distended  MS: Trace edema; warm and well perfused Neuro:  Nonfocal  Psych: Normal affect   Labs   Chemistry Recent Labs  Lab 11/29/21 0330  NA 132*  K 4.2  CL 95*  CO2 24  GLUCOSE 119*  BUN 112*  CREATININE 2.86*  CALCIUM 8.4*  MG 2.7*  GFRNONAA 22*  ANIONGAP 13     Hematology Recent Labs  Lab 11/29/21 0330  WBC 9.5  RBC 2.93*  HGB 8.0*  HCT 24.5*  MCV 83.6  MCH 27.3  MCHC 32.7  RDW 17.8*  PLT 322     Patient Profile     81 y.o. male with a history of CAD s/p remote CABG in 1996, chronic combined CHF with EF of 20% s/p [employer] ICD, paroxysmal atrial fibrillation on Eliquis, hypertension, type 2 diabetes mellitus, CKD stage III who presented to [facility] on 10/19/2021 with generalized weakness and back pain and was found to have septic shock secondary to urosepsis with hydronephrosis and AKI as well as L4-L5  osteomyelitis/discitis. He was transferred to [facility] where he was admitted to medical ICU. He underwent disc aspiration on 6/8 by IR which grew Klebsiella. He was treated with IV fluids per sepsis protocol and then went into acute CHF. He was started on IV Lasix but remained significant volume overloaded. Therefore, Cardiology was consulted for assistance on 11/20/2021.  Echocardiogram June 2023 showed severe LV dysfunction with ejection fraction less than  20%, grade 2 diastolic dysfunction, mild mitral regurgitation, moderate tricuspid regurgitation.  Right heart catheterization June 2023 showed RA pressure 12, pulmonary artery pressure 62/18 and pulmonary capillary wedge pressure of 13.  Assessment & Plan    Acute on chronic systolic congestive heart failure Anasarca Acute on chronic CKD stage IIIb - Remains relatively stable on low dose DBA with volume overload.  Plans underway to transition to CMO/inpatient hospice.  D/C DBA later today when patient/family are ready.  Paroxysmal atrial fibrillation -Rate controlled on exam, D/C A/C when transition to CMO/CC.  Status post ICD ([employer])- - ICD turned off.  Will sign off, call with ?s.  For questions or updates, please contact [facility] Please consult [email or web address] for contact info under        Signed, Orbie Pyo, MD  12/05/2021, 6:27 AM

## 2021-12-06 DIAGNOSIS — I5023 Acute on chronic systolic (congestive) heart failure: Secondary | ICD-10-CM | POA: Diagnosis not present

## 2021-12-06 DIAGNOSIS — R6521 Severe sepsis with septic shock: Secondary | ICD-10-CM | POA: Diagnosis not present

## 2021-12-06 DIAGNOSIS — Z7189 Other specified counseling: Secondary | ICD-10-CM | POA: Diagnosis not present

## 2021-12-06 DIAGNOSIS — Z515 Encounter for palliative care: Secondary | ICD-10-CM | POA: Diagnosis not present

## 2021-12-06 DIAGNOSIS — A419 Sepsis, unspecified organism: Secondary | ICD-10-CM | POA: Diagnosis not present

## 2021-12-06 MED ORDER — LORAZEPAM 2 MG/ML IJ SOLN
1.0000 mg | Freq: Two times a day (BID) | INTRAMUSCULAR | Status: DC
  Administered 2021-12-06 – 2021-12-07 (×4): 1 mg via INTRAVENOUS
  Filled 2021-12-06 (×4): qty 1

## 2021-12-06 NOTE — Progress Notes (Signed)
RN from 6N to call back to get report.

## 2021-12-06 NOTE — Progress Notes (Signed)
Nutrition Brief Note  Chart reviewed. Pt now transitioning to comfort care.  No further nutrition interventions planned at this time.  Please re-consult as needed.   Neill Jurewicz, RD, LDN Clinical Dietitian RD pager # available in AMION  After hours/weekend pager # available in AMION   

## 2021-12-06 NOTE — Progress Notes (Signed)
Transferred to 6N by bed , report given to Rn.

## 2021-12-06 NOTE — TOC Progression Note (Signed)
Transition of Care [facility]) - Progression Note    Patient Details  Name: Glenn [name] MRN: [id number] Date of Birth: 08/31/1940  [facility] CM/SW Contact  Beckie Busing, RN Phone Number:[phone]  12/06/2021, 11:48 AM  Clinical Narrative:    CM received message requesting that CM discuss option for residential hospice facility. CM at bedside patient and family at bedside. CM explained residential hospice and inquired if patient and family would like to pursue residential hospice options. Patient states that he does not want to go to residential hospice. Patient states that he wishes to be here at the hospital and that he was previously told that he would be able to transfer to the 6th floor. Patient states that he does not want to go to residential hospice or home. MD and palliative have been updated.    Expected Discharge Plan: Home/Self Care Barriers to Discharge: Continued Medical Work up  Expected Discharge Plan and Services Expected Discharge Plan: Home/Self Care In-house Referral: NA Discharge Planning Services: CM Consult Post Acute Care Choice: NA Living arrangements for the past 2 months: Single Family Home                 DME Arranged: N/A DME Agency: NA       HH Arranged: NA HH Agency: NA         Social Determinants of Health (SDOH) Interventions    Readmission Risk Interventions    11/06/2021   12:28 PM  Readmission Risk Prevention Plan  Transportation Screening Complete  PCP or Specialist Appt within 3-5 Days Complete  HRI or Home Care Consult Complete  Social Work Consult for Recovery Care Planning/Counseling Complete  Palliative Care Screening Not Applicable  Medication Review Oceanographer) Referral to Pharmacy

## 2021-12-06 NOTE — Progress Notes (Signed)
This chaplain is present for EOL spiritual care with the Pt.   The chaplain is at the bedside listening reflectively as the Pt. describes his journey towards death. The chaplain understands the Pt. is experiencing feelings of nausea after a few swallows of water. The Pt. describes the nausea as his body shutting down. The chaplain understands the Pt. is looking for peace between his body and mind.  The chaplain stepped away with a blessing after the Pt. family arrived.  Chaplain Stephanie Acre [phone]

## 2021-12-06 NOTE — Progress Notes (Signed)
PROGRESS NOTE  Glenn [name]  OJJ:[id number] DOB: 08-04-1940 DOA: 10/20/2021 PCP: System, Provider Not In   Brief Narrative:  Patient is a 81 y.o. M with CAD s/p CABG 96, ICM and sCHF EF <20%, DM, CKD IIIb baseline 1.7, pAF on Eliquis, hx PPM and BPH who presented with 3 days weakness, back pain to [facility].  Patient does intermittent catheterization at home whenever he feels like doing usually every 48 hours.  On presentation he was hyportensive.  Lab work showed leukocytosis.  Creatinine was in the range of 3.6.  CT imaging showed distended bladder, discitis/osteomyelitis of the spine.  He was transferred to [facility], ICU for pressures.  Foley was placed for urinary retention.  MRI did not show any drainable focus, pressure started off, transferred out of ICU.  Underwent disc aspiration on 6/8 by IR, cultures showed Klebsiella.  ID was consulted and recommended antibiotics.  PT/OT recommended CIR on discharge.  Because of severe volume overload, IV Lasix was started.  Cardiology also consulted  for resistant anasarca.  Prolonged hospitalization due to anasarca not responding to diuresis.  Palliative care consulted for goals of care, converted to full comfort care now.  TOC following for possible transfer to residential hospice  Assessment & Plan:  Principal Problem:   Septic shock (HCC) Active Problems:   Bacteremia due to Klebsiella pneumoniae   Discitis   Osteomyelitis of lumbar spine (HCC)   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HCC)   Bladder outlet obstruction   Acute kidney injury (HCC)   Uncontrolled type 2 diabetes mellitus with hyperglycemia, with long-term current use of insulin (HCC)   Pacemaker   Chronic kidney disease, stage 3b (HCC)   Coronary artery disease involving native coronary artery of native heart without angina pectoris   Chronic combined systolic and diastolic CHF (congestive heart failure) (HCC)   Hyponatremia   Hypokalemia   Transaminitis   Myocardial injury   Thrombocytopenia  (HCC)   Anemia due to chronic kidney disease   Coagulopathy (HCC)   Protein-calorie malnutrition, moderate (HCC)   Paroxysmal atrial fibrillation (HCC)   Foot ulcer (HCC)   Acute on chronic systolic congestive heart failure (HCC)  Chronic combined systolic/diastolic congestive heart failure/volume overload: Known history of systolic congestive heart failure.  EF is less than 20%, has grade 2 diastolic dysfunction.  Status post ICD placement.  Looked  severely volume overloaded  along with bilateral lower extremity edema, scrotal edema.did not respond to aggressive IV Lasix right heart cath with finding of normal left-sided pressures with elevated RAP, moderate pulmonary hypertension.  Was also on dobutamine drip.  On comfort care.  AICD deactivated  Septic shock/Klebsiella bacteremia/UTI: Presented to [facility] initially with back pain, weakness.  Found to be very hypotensive.  Septic shock suspected.  Started on broad-spectrum antibiotics.  Sent to ICU at [facility] with pressor support.  Currently hemodynamically stable Found to have osteomyelitis/discitis of lumbar spine, psoas abscess.  Underwent disc space drainage by IR.  Culture resulted Klebsiella.  ID was consulted.  PICC line placed.  Recommended cefazolin till 7/18.  Sepsis physiology has resolved.  Leukocytosis resolved. Now on comfort care  AKI on CKD stage IIIb: Baseline creatinine arounfd 1.8.  Presented with creatinine in the range of 3.6.  On comfort care  Coronary artery disease: No anginal symptoms.  On Lipitor, beta-blocker at home now on hold.  On comfort care  Paroxysmal A-fib: Currently rate is controlled.  On Eliquis for anticoagulation.  Monitor on telemetry.  Was on Coreg for rate control,  currently on hold due to hypotension.  On comfort care  Bladder outlet obstruction: On presentation, imaging showed distended bladder.  Takes finasteride, Flomax at home.  Does self cath at home.  Foley was placed here.   .  Type 2  diabetes/hyperglycemia: Last hemoglobin A1c of more than 10.    Normocytic anemia:   Hemoglobin was in the range of 11 few weeks ago.Now trended down to 7-8.  No evidence of acute blood loss.  .    Deconditioning/debility: PT/OT recommended CIR on discharge.  Now on comfort care  Goals of care: Palliative care consulted due to persistent anasarca not responding to medications.  On comfort care   Nutrition Problem: Moderate Malnutrition Etiology: chronic illness (CHF) Pressure Injury 11/02/21 Foot Left;Lateral Deep Tissue Pressure Injury - Purple or maroon localized area of discolored intact skin or blood-filled blister due to damage of underlying soft tissue from pressure and/or shear. 5.5x4 (Active)  11/02/21   Location: Foot  Location Orientation: Left;Lateral  Staging: Deep Tissue Pressure Injury - Purple or maroon localized area of discolored intact skin or blood-filled blister due to damage of underlying soft tissue from pressure and/or shear.  Wound Description (Comments): 5.5x4  Present on Admission: Yes  Dressing Type Foam - Lift dressing to assess site every shift 12/06/21 0750    DVT prophylaxis:Place and maintain sequential compression device Start: 11/02/21 1108     Code Status: DNR  Family Communication: Discussed with daughter at bedside on 7/4  Patient status:Inpatient  Patient is from :Home  Anticipated discharge to: Not sure  Estimated DC date: Currently on comfort care.  We will check with palliative care if we seek residential hospice placement  Consultants: PCCM, IR, ID,palliative care  Procedures: Dx aspiration,right heart cath  Antimicrobials:  Anti-infectives (From admission, onward)    Start     Dose/Rate Route Frequency Ordered Stop   11/23/21 2200  ceFAZolin (ANCEF) IVPB 2g/100 mL premix  Status:  Discontinued        2 g 200 mL/hr over 30 Minutes Intravenous Every 12 hours 11/23/21 0918 12/05/21 1028   11/22/21 1400  ceFAZolin (ANCEF) IVPB  2g/100 mL premix  Status:  Discontinued        2 g 200 mL/hr over 30 Minutes Intravenous Every 8 hours 11/22/21 0828 11/23/21 0918   11/20/21 2000  ceFAZolin (ANCEF) IVPB 2g/100 mL premix  Status:  Discontinued        2 g 200 mL/hr over 30 Minutes Intravenous Every 12 hours 11/20/21 1230 11/22/21 0828   11/20/21 1600  ceFAZolin (ANCEF) IVPB 2g/100 mL premix  Status:  Discontinued        2 g 200 mL/hr over 30 Minutes Intravenous Every 8 hours 11/20/21 1209 11/20/21 1230   11/12/21 1800  ceFAZolin (ANCEF) IVPB 2g/100 mL premix  Status:  Discontinued        2 g 200 mL/hr over 30 Minutes Intravenous Every 12 hours 11/12/21 0936 11/20/21 1209   11/04/21 1400  ceFAZolin (ANCEF) IVPB 2g/100 mL premix  Status:  Discontinued        2 g 200 mL/hr over 30 Minutes Intravenous Every 8 hours 11/04/21 1058 11/12/21 0936   11/03/21 1800  vancomycin (VANCOCIN) IVPB 1000 mg/200 mL premix  Status:  Discontinued        1,000 mg 200 mL/hr over 60 Minutes Intravenous Every 48 hours 11/02/21 0259 11/02/21 0452   11/02/21 2200  cefTRIAXone (ROCEPHIN) 2 g in sodium chloride 0.9 % 100 mL IVPB  Status:  Discontinued        2 g 200 mL/hr over 30 Minutes Intravenous Every 24 hours 11/02/21 0452 11/04/21 1058   11/02/21 0230  ceFEPIme (MAXIPIME) 2 g in sodium chloride 0.9 % 100 mL IVPB  Status:  Discontinued        2 g 200 mL/hr over 30 Minutes Intravenous Every 24 hours 11/02/21 0131 11/02/21 0452       Subjective:  Patient seen and examined at the bedside this morning.  Appears comfortable, on full comfort care now.  Had a good night sleep  Objective: Vitals:   12/04/21 2350 12/05/21 0357 12/05/21 1959 12/06/21 0753  BP: 118/68 109/65  (!) 78/64  Pulse:   86 90  Resp: 18 18    Temp: 97.7 F (36.5 C) 98.3 F (36.8 C)    TempSrc: Oral Oral    SpO2:   97% 95%  Weight:      Height:        Intake/Output Summary (Last 24 hours) at 12/06/2021 1058 Last data filed at 12/05/2021 1901 Gross per 24 hour   Intake 107.93 ml  Output --  Net 107.93 ml   Filed Weights   11/27/21 0705 11/28/21 0630 11/29/21 0319  Weight: 75.5 kg 69 kg 73.1 kg    Examination:   General exam: Very deconditioned, chronically ill looking HEENT: PERRL Respiratory system:  no wheezes or crackles  Cardiovascular system: S1 & S2 heard, RRR.  Gastrointestinal system: Abdomen is nondistended, soft and nontender. Central nervous system: Alert and oriented Extremities: Bilateral lower extremity edema Skin: No rashes, no ulcers,no icterus   GU: Foley, penile/scrotal edema  Data Reviewed: I have personally reviewed following labs and imaging studies  CBC: No results for input(s): "WBC", "NEUTROABS", "HGB", "HCT", "MCV", "PLT" in the last 168 hours.  Basic Metabolic Panel: No results for input(s): "NA", "K", "CL", "CO2", "GLUCOSE", "BUN", "CREATININE", "CALCIUM", "MG", "PHOS" in the last 168 hours.    No results found for this or any previous visit (from the past 240 hour(s)).    Radiology Studies: No results found.  Scheduled Meds:  (feeding supplement) PROSource Plus  30 mL Oral TID BM   artificial tears   Both Eyes QHS   chlorhexidine  15 mL Mouth Rinse BID   Chlorhexidine Gluconate Cloth  6 each Topical Q0600   glycopyrrolate  0.4 mg Intravenous TID   ketotifen  1 drop Both Eyes BID   latanoprost  1 drop Both Eyes QHS   lidocaine  2 patch Transdermal Daily   mouth rinse  15 mL Mouth Rinse q12n4p   sodium chloride flush  10-40 mL Intracatheter Q12H   sodium chloride flush  10-40 mL Intracatheter Q12H   sodium chloride flush  3 mL Intravenous Q12H   tamsulosin  0.8 mg Oral QHS   Continuous Infusions:  sodium chloride 250 mL (11/02/21 0205)   sodium chloride 10 mL/hr at 11/27/21 0512   HYDROmorphone 0.5 mg/hr (12/05/21 1901)     LOS: 35 days   Shelly Coss, MD [facility] P7/10/2021, 10:58 AM

## 2021-12-06 NOTE — Progress Notes (Signed)
Palliative:  HPI: 81 y.o. male  with past medical history of CHF, s/p Chemical engineer PPM/ICD, CAD, IDDM, CKD stage 3b, prostatomegaly admitted on 10/04/2021 with weakness x 3 days after fall followed with pain, difficulty to ambulate, decrease intake and urine output. Noted to have hypotension, leukocytosis related to discitis aspiration showing +Klebsiella along with UTI. Hospitalization complicated by cardiogenic shock EF <09%, grade 2 diastolic dysfunction, moderate pulmonary hypertension, renal failure, bladder outlet obstruction, paroxysmal atrial fibrillation. Ongoing significant fluid overload and poor response to diuresis. Trial large dose of Lasix while on dobutamine.     I met today at Glenn [name] bedside along with wife and daughter. He is still awake, oriented, and speaking with [place] even with dilaudid infusion. He reports some confusion when he awakens but also improved pain relief. He does not need increased relief at this time although he does ask for help with sleeping. I believe the greatest barrier to sleep is anxiety so I will order IV ativan BID with PRN doses for breakthrough. Dilaudid infusion has range order with room for bolus and increase in basal rate as needed.   Glenn [name] continues to talk about his time coming very close. He tells me that he can feel the changes in his body and he is ready when his time comes. He looks forward to rest. He is not open to hospice options but open to transfer out of progressive unit to regular room where his family can visit with him more comfortably. He does not wish to leave the hospital because he feels that his time is close.   All questions/concerns addressed. Emotional support provided. Discussed with RN, TOC, Dr. Tawanna Solo.   Exam: Awake, alert. No distress. Generalized weakness and fatigue. Voice weak and hoarse. Breathing regular, unlabored. Generalized pain at times. Abd soft.   Plan: - DNR - Comfort care - Dilaudid infusion and  scheduled Ativan. PRNs available as well.   [place], NP Palliative Medicine Team Pager [phone] (Please see [email or web address] for schedule) Team Phone [phone]    Greater than 50%  of this time was spent counseling and coordinating care related to the above assessment and plan

## 2021-12-07 DIAGNOSIS — A419 Sepsis, unspecified organism: Secondary | ICD-10-CM | POA: Diagnosis not present

## 2021-12-07 DIAGNOSIS — R6521 Severe sepsis with septic shock: Secondary | ICD-10-CM | POA: Diagnosis not present

## 2021-12-07 MED ORDER — PANTOPRAZOLE SODIUM 40 MG PO TBEC
40.0000 mg | DELAYED_RELEASE_TABLET | Freq: Every day | ORAL | Status: DC
  Administered 2021-12-07: 40 mg via ORAL
  Filled 2021-12-07: qty 1

## 2021-12-07 NOTE — Progress Notes (Signed)
PROGRESS NOTE  Glenn [name]  WUJ:[id number] DOB: 1941/02/03 DOA: November 18, 2021 PCP: System, Provider Not In   Brief Narrative:  Patient is a 81 y.o. 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  Patient does intermittent catheterization at home whenever he feels like doing usually every 48 hours.  On presentation he was hyportensive.  Lab work showed leukocytosis.  Creatinine was in the range of 3.6.  CT imaging showed distended bladder, discitis/osteomyelitis of the spine.  He was transferred to [facility], ICU for pressures.  Foley was placed for urinary retention.  MRI did not show any drainable focus, pressure started off, transferred out of ICU.  Underwent disc aspiration on 6/8 by IR, cultures showed Klebsiella.  ID was consulted and recommended antibiotics.  PT/OT recommended CIR on discharge.  Because of severe volume overload, IV Lasix was started.  Cardiology also consulted  for resistant anasarca.  Prolonged hospitalization due to anasarca not responding to diuresis.  Palliative care consulted for goals of care, converted to full comfort care now.  Assessment & Plan:  Principal Problem:   Septic shock (HCC) Active Problems:   Bacteremia due to Klebsiella pneumoniae   Discitis   Osteomyelitis of lumbar spine (HCC)   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HCC)   Bladder outlet obstruction   Acute kidney injury (HCC)   Uncontrolled type 2 diabetes mellitus with hyperglycemia, with long-term current use of insulin (HCC)   Pacemaker   Chronic kidney disease, stage 3b (HCC)   Coronary artery disease involving native coronary artery of native heart without angina pectoris   Chronic combined systolic and diastolic CHF (congestive heart failure) (HCC)   Hyponatremia   Hypokalemia   Transaminitis   Myocardial injury   Thrombocytopenia (HCC)   Anemia due to chronic kidney disease   Coagulopathy  (HCC)   Protein-calorie malnutrition, moderate (HCC)   Paroxysmal atrial fibrillation (HCC)   Foot ulcer (HCC)   Acute on chronic systolic congestive heart failure (HCC)  Chronic combined systolic/diastolic congestive heart failure/volume overload: Known history of systolic congestive heart failure.  EF is less than 20%, has grade 2 diastolic dysfunction.  Status post ICD placement.  Looked  severely volume overloaded  along with bilateral lower extremity edema, scrotal edema.did not respond to aggressive IV Lasix right heart cath with finding of normal left-sided pressures with elevated RAP, moderate pulmonary hypertension.  Was also on dobutamine drip.  On comfort care.  AICD deactivated  Septic shock/Klebsiella bacteremia/UTI: Presented to [facility] initially with back pain, weakness.  Found to be very hypotensive.  Septic shock suspected.  Started on broad-spectrum antibiotics.  Sent to ICU at [facility] with pressor support.  Currently hemodynamically stable Found to have osteomyelitis/discitis of lumbar spine, psoas abscess.  Underwent disc space drainage by IR.  Culture resulted Klebsiella.  ID was consulted.  PICC line placed.  Recommended cefazolin till 7/18.  Sepsis physiology has resolved.  Leukocytosis resolved. Now on comfort care  AKI on CKD stage IIIb: Baseline creatinine arounfd 1.8.  Presented with creatinine in the range of 3.6.  On comfort care  Coronary artery disease: No anginal symptoms.  On Lipitor, beta-blocker at home now on hold.  On comfort care  Paroxysmal A-fib: Currently rate is controlled.  On Eliquis for anticoagulation.  Monitor on telemetry.  Was on Coreg for rate control, currently on hold due to hypotension.  On comfort  care  Bladder outlet obstruction: On presentation, imaging showed distended bladder.  Takes finasteride, Flomax at home.  Does self cath at home.  Foley was placed here.     Type 2 diabetes/hyperglycemia: Last hemoglobin A1c of more than 10.     Normocytic anemia:   Hemoglobin was in the range of 11 few weeks ago.Now trended down to 7-8.  No evidence of acute blood loss.  .    Deconditioning/debility: PT/OT recommended CIR on discharge.  Now on comfort care  Goals of care: Palliative care consulted due to persistent anasarca not responding to medications.  On comfort care   Nutrition Problem: Moderate Malnutrition Etiology: chronic illness (CHF) Pressure Injury 11/02/21 Foot Left;Lateral Deep Tissue Pressure Injury - Purple or maroon localized area of discolored intact skin or blood-filled blister due to damage of underlying soft tissue from pressure and/or shear. 5.5x4 (Active)  11/02/21   Location: Foot  Location Orientation: Left;Lateral  Staging: Deep Tissue Pressure Injury - Purple or maroon localized area of discolored intact skin or blood-filled blister due to damage of underlying soft tissue from pressure and/or shear.  Wound Description (Comments): 5.5x4  Present on Admission: Yes  Dressing Type Foam - Lift dressing to assess site every shift 12/06/21 2250    DVT prophylaxis:Place and maintain sequential compression device Start: 11/02/21 1108     Code Status: DNR  Family Communication: Discussed with daughter at bedside on 7/4  Patient status:Inpatient  Patient is from :Home  Anticipated discharge to: Not sure  Estimated DC date: Currently on comfort care.  Anticipate hospital death  Consultants: PCCM, IR, ID,palliative care  Procedures: Dx aspiration,right heart cath  Antimicrobials:  Anti-infectives (From admission, onward)    Start     Dose/Rate Route Frequency Ordered Stop   11/23/21 2200  ceFAZolin (ANCEF) IVPB 2g/100 mL premix  Status:  Discontinued        2 g 200 mL/hr over 30 Minutes Intravenous Every 12 hours 11/23/21 0918 12/05/21 1028   11/22/21 1400  ceFAZolin (ANCEF) IVPB 2g/100 mL premix  Status:  Discontinued        2 g 200 mL/hr over 30 Minutes Intravenous Every 8 hours 11/22/21  0828 11/23/21 0918   11/20/21 2000  ceFAZolin (ANCEF) IVPB 2g/100 mL premix  Status:  Discontinued        2 g 200 mL/hr over 30 Minutes Intravenous Every 12 hours 11/20/21 1230 11/22/21 0828   11/20/21 1600  ceFAZolin (ANCEF) IVPB 2g/100 mL premix  Status:  Discontinued        2 g 200 mL/hr over 30 Minutes Intravenous Every 8 hours 11/20/21 1209 11/20/21 1230   11/12/21 1800  ceFAZolin (ANCEF) IVPB 2g/100 mL premix  Status:  Discontinued        2 g 200 mL/hr over 30 Minutes Intravenous Every 12 hours 11/12/21 0936 11/20/21 1209   11/04/21 1400  ceFAZolin (ANCEF) IVPB 2g/100 mL premix  Status:  Discontinued        2 g 200 mL/hr over 30 Minutes Intravenous Every 8 hours 11/04/21 1058 11/12/21 0936   11/03/21 1800  vancomycin (VANCOCIN) IVPB 1000 mg/200 mL premix  Status:  Discontinued        1,000 mg 200 mL/hr over 60 Minutes Intravenous Every 48 hours 11/02/21 0259 11/02/21 0452   11/02/21 2200  cefTRIAXone (ROCEPHIN) 2 g in sodium chloride 0.9 % 100 mL IVPB  Status:  Discontinued        2 g 200 mL/hr over 30 Minutes Intravenous  Every 24 hours 11/02/21 0452 11/04/21 1058   11/02/21 0230  ceFEPIme (MAXIPIME) 2 g in sodium chloride 0.9 % 100 mL IVPB  Status:  Discontinued        2 g 200 mL/hr over 30 Minutes Intravenous Every 24 hours 11/02/21 0131 11/02/21 0452       Subjective:  Patient seen and examined at the bedside this morning.  Remains comfortable, on full comfort care.  No new complaints Objective: Vitals:   12/05/21 0357 12/05/21 1959 12/06/21 0753 12/06/21 1648  BP: 109/65  (!) 78/64 125/67  Pulse:  86 90 85  Resp: 18   19  Temp: 98.3 F (36.8 C)     TempSrc: Oral     SpO2:  97% 95% 100%  Weight:      Height:        Intake/Output Summary (Last 24 hours) at 12/07/2021 1251 Last data filed at 12/07/2021 1050 Gross per 24 hour  Intake 214 ml  Output 200 ml  Net 14 ml   Filed Weights   11/27/21 0705 11/28/21 0630 11/29/21 0319  Weight: 75.5 kg 69 kg 73.1 kg     Examination:   General exam: Very deconditioned, weak, overall comfortable without any distress, bilateral lower extremity edema, scrotal swelling  Data Reviewed: I have personally reviewed following labs and imaging studies  CBC: No results for input(s): "WBC", "NEUTROABS", "HGB", "HCT", "MCV", "PLT" in the last 168 hours.  Basic Metabolic Panel: No results for input(s): "NA", "K", "CL", "CO2", "GLUCOSE", "BUN", "CREATININE", "CALCIUM", "MG", "PHOS" in the last 168 hours.    No results found for this or any previous visit (from the past 240 hour(s)).    Radiology Studies: No results found.  Scheduled Meds:  (feeding supplement) PROSource Plus  30 mL Oral TID BM   artificial tears   Both Eyes QHS   chlorhexidine  15 mL Mouth Rinse BID   Chlorhexidine Gluconate Cloth  6 each Topical Q0600   glycopyrrolate  0.4 mg Intravenous TID   ketotifen  1 drop Both Eyes BID   latanoprost  1 drop Both Eyes QHS   lidocaine  2 patch Transdermal Daily   LORazepam  1 mg Intravenous Q12H   mouth rinse  15 mL Mouth Rinse q12n4p   pantoprazole  40 mg Oral Daily   sodium chloride flush  10-40 mL Intracatheter Q12H   sodium chloride flush  10-40 mL Intracatheter Q12H   sodium chloride flush  3 mL Intravenous Q12H   tamsulosin  0.8 mg Oral QHS   Continuous Infusions:  sodium chloride 250 mL (11/02/21 0205)   sodium chloride 10 mL/hr at 11/27/21 0512   HYDROmorphone 0.5 mg/hr (12/06/21 1905)     LOS: 36 days   Shelly Coss, MD [facility] P7/11/2021, 12:51 PM

## 2021-12-07 NOTE — Progress Notes (Signed)
Patient Glenn [name]      DOB: 18-Jun-1940      WFU:[id number]      Palliative Medicine Team    Subjective: Bedside symptom check completed. Two family members bedside at time of visit.    Physical exam: Patient resting in bed with eyes closed at time of visit. Breathing even and non-labored, no excessive secretions noted. Patient without physical or non-verbal signs of pain or discomfort at this time. Patient did not acknowledge this RN with verbal introduction of self. This RN did not attempt to further stimulate patient at this time to promote comfort.    Assessment and plan: Bedside RN Harriett Sine without any needs or concerns today, endorses they have had a good day without signs of discomfort or distress. Family without any needs or concerns at this time. Will continue to follow for any changes or advances.    Thank you for allowing the Palliative Medicine Team to assist in the care of this patient.     Shelda Jakes, MSN, RN Palliative Medicine Team Team Phone: [phone]  This phone is monitored 7a-7p, please reach out to attending physician outside of these hours for urgent needs.

## 2021-12-07 NOTE — Progress Notes (Signed)
This chaplain is present for F/U EOL spiritual care.  The Pt. is resting comfortably, after a dose of Ativan, as described by the Pt. family-wife and daughter at the bedside.   The Pt. wife-expressed questions about the dying process and noted the Pt. had a few bites of breakfast and tomato soup last night. The chaplain informed the family of PMT RN visit later this afternoon and the individualization of the dying process. The chaplain empowered the family to express any cares and concerns as needed.  The chaplain reflected on the Pt. time with the chaplain and the importance of family to the Pt. The chaplain accepted the family's request for prayer.  Chaplain Stephanie Acre [phone]

## 2021-12-15 ENCOUNTER — Inpatient Hospital Stay: Payer: No Typology Code available for payment source | Admitting: Family

## 2022-01-02 NOTE — Progress Notes (Signed)
0100 checked on patient noticed no chest rise and fall. Grabbed the Press photographer and another Charity fundraiser. Assessment was done. MD was notified. Two RNs pronounced pt at 0110. Daughter was at bedside. Daughter notified family.

## 2022-01-02 NOTE — Progress Notes (Signed)
    OVERNIGHT PROGRESS REPORT  Notified by RN that patient has expired at 0110 hrs.  Patient was DNR/comfort care.  2 RN verified.  Family was immediately available to RN.   Glenn [name] MSNA ACNPC-AG Acute Care Nurse Practitioner [facility]

## 2022-01-02 NOTE — Progress Notes (Signed)
Wasted 5 mL of dilaudid was was left in IV line with Karolee Ohs, RN.

## 2022-01-02 NOTE — Discharge Summary (Signed)
Death Summary  Glenn [name] TOI:[id number] DOB: 1941/05/17 DOA: 2021/11/04  PCP: System, Provider Not In  Admit date: 11-04-21 Date of Death: 2021/12/11 Time of Death: 0110    History of present illness:   Patient is a 81 y.o. 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  Patient does intermittent catheterization at home whenever he feels like doing usually every 48 hours.  On presentation he was hyportensive.  Lab work showed leukocytosis.  Creatinine was in the range of 3.6.  CT imaging showed distended bladder, discitis/osteomyelitis of the spine.  He was transferred to [facility], ICU for pressures.  Foley was placed for urinary retention.  MRI did not show any drainable focus, pressure started off, transferred out of ICU.  Underwent disc aspiration on 6/8 by IR, cultures showed Klebsiella.  ID was consulted and recommended antibiotics.  PT/OT recommended CIR on discharge.  Because of severe volume overload, IV Lasix was started.  Cardiology also consulted  for resistant anasarca.  Prolonged hospitalization due to anasarca not responding to diuresis.  Palliative care consulted for goals of care, converted to full comfort care.He passed away peacefully in the early morning of 2021/12/11  Final Diagnoses:  1.   Severe systolic congestive heart failure   The results of significant diagnostics from this hospitalization (including imaging, microbiology, ancillary and laboratory) are listed below for reference.    Significant Diagnostic Studies: DG CHEST PORT 1 VIEW  Result Date: 12/04/2021 CLINICAL DATA:  Check PICC placement EXAM: PORTABLE CHEST 1 VIEW COMPARISON:  11/02/2021 FINDINGS: Cardiac shadow is stable. Defibrillator and postsurgical changes are again noted and stable. Right-sided PICC is seen with the tip at the cavoatrial junction. No focal infiltrate is seen. Elevation of  the right hemidiaphragm is seen. IMPRESSION: PICC placement in satisfactory position. Electronically Signed   By: Alcide Clever M.D.   On: 12/04/2021 00:07   CARDIAC CATHETERIZATION  Result Date: 27-Nov-2021 Findings: RA = 12 RV = 65/11 PA = 62/18 (35) PCW = 13 Fick cardiac output/index = 5.4/2.8 Thermodilution CO/CI = 4.3/2.2 PVR = 4.5 WU FA sat = 97% PA sat = 49%, 54% PAPi = 3.7 Assessment: 1. Normal left sided filling pressures 2. 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 Mildly decreased cardiac output Plan/Discussion: He seems to have R>L HF. Hold diuretics for now. Co-ox on the low end. Can follow co-ox via PICC and adjust meds as needed Arvilla Meres, MD 2:19 PM   Microbiology: No results found for this or any previous visit (from the past 240 hour(s)).   Labs: Basic Metabolic Panel: No results for input(s): "NA", "K", "CL", "CO2", "GLUCOSE", "BUN", "CREATININE", "CALCIUM", "MG", "PHOS" in the last 168 hours. Liver Function Tests: No results for input(s): "AST", "ALT", "ALKPHOS", "BILITOT", "PROT", "ALBUMIN" in the last 168 hours. No results for input(s): "LIPASE", "AMYLASE" in the last 168 hours. No results for input(s): "AMMONIA" in the last 168 hours. CBC: No results for input(s): "WBC", "NEUTROABS", "HGB", "HCT", "MCV", "PLT" in the last 168 hours. Cardiac Enzymes: No results for input(s): "CKTOTAL", "CKMB", "CKMBINDEX", "TROPONINI" in the last 168 hours. D-Dimer No results for input(s): "DDIMER" in the last 72 hours. BNP: Invalid input(s): "POCBNP" CBG: No results for input(s): "GLUCAP" in the last 168 hours. Anemia work up No results for input(s): "VITAMINB12", "FOLATE", "FERRITIN", "TIBC", "IRON", "RETICCTPCT" in the last 72 hours.  Urinalysis    Component Value Date/Time   COLORURINE YELLOW (A) November 08, 2021 1707   APPEARANCEUR TURBID (A) 2021/11/08 1707   LABSPEC 1.010 11-08-2021 1707   PHURINE 5.0 2021-11-08 1707   GLUCOSEU >=500 (A) 08-Nov-2021 1707   HGBUR  LARGE (A) 2021-11-08 1707   BILIRUBINUR NEGATIVE 08-Nov-2021 1707   KETONESUR NEGATIVE 2021-11-08 1707   PROTEINUR 30 (A) 11/08/2021 1707   NITRITE NEGATIVE 2021-11-08 1707   LEUKOCYTESUR LARGE (A) 11-08-2021 1707   Sepsis Labs No results for input(s): "WBC" in the last 168 hours.  Invalid input(s): "PROCALCITONIN", "LACTICIDVEN"     SIGNED:  Burnadette Pop, MD  [facility] 12/13/2021, 5:41 PM Pager [phone]  If 7PM-7AM, please contact night-coverage [email or web address] Password [email or web address]

## 2022-01-02 NOTE — Plan of Care (Signed)
   Problem: Education: Goal: Ability to describe self-care measures that may prevent or decrease complications (Diabetes Survival Skills Education) will improve Outcome: Not Met (Patient Deceased) Goal: Individualized Educational Video(s) Outcome: Not Met (Patient Deceased)   Problem: Coping: Goal: Ability to adjust to condition or change in health will improve Outcome: Not Met (Patient Deceased)   Problem: Fluid Volume: Goal: Ability to maintain a balanced intake and output will improve Outcome: Not Met (Patient Deceased)   Problem: Health Behavior/Discharge Planning: Goal: Ability to identify and utilize available resources and services will improve Outcome: Not Met (Patient Deceased) Goal: Ability to manage health-related needs will improve Outcome: Not Met (Patient Deceased)   Problem: Metabolic: Goal: Ability to maintain appropriate glucose levels will improve Outcome: Not Met (Patient Deceased)   Problem: Nutritional: Goal: Maintenance of adequate nutrition will improve Outcome: Not Met (Patient Deceased) Goal: Progress toward achieving an optimal weight will improve Outcome: Not Met (Patient Deceased)   Problem: Skin Integrity: Goal: Risk for impaired skin integrity will decrease Outcome: Not Met (Patient Deceased)   Problem: Tissue Perfusion: Goal: Adequacy of tissue perfusion will improve Outcome: Not Met (Patient Deceased)   Problem: Clinical Measurements: Goal: Will remain free from infection Outcome: Not Met (Patient Deceased) Goal: Respiratory complications will improve Outcome: Not Met (Patient Deceased) Goal: Cardiovascular complication will be avoided Outcome: Not Met (Patient Deceased)   Problem: Activity: Goal: Risk for activity intolerance will decrease Outcome: Not Met (Patient Deceased)   Problem: Nutrition: Goal: Adequate nutrition will be maintained Outcome: Not Met (Patient Deceased)   Problem: Coping: Goal: Level of anxiety will  decrease Outcome: Not Met (Patient Deceased)   Problem: Elimination: Goal: Will not experience complications related to bowel motility Outcome: Not Met (Patient Deceased) Goal: Will not experience complications related to urinary retention Outcome: Not Met (Patient Deceased)   Problem: Pain Managment: Goal: General experience of comfort will improve Outcome: Not Met (Patient Deceased)   Problem: Safety: Goal: Ability to remain free from injury will improve Outcome: Not Met (Patient Deceased)   Problem: Skin Integrity: Goal: Risk for impaired skin integrity will decrease Outcome: Not Met (Patient Deceased)   Problem: Education: Goal: Understanding of CV disease, CV risk reduction, and recovery process will improve Outcome: Not Met (Patient Deceased) Goal: Individualized Educational Video(s) Outcome: Not Met (Patient Deceased)   Problem: Activity: Goal: Ability to return to baseline activity level will improve Outcome: Not Met (Patient Deceased)   Problem: Cardiovascular: Goal: Ability to achieve and maintain adequate cardiovascular perfusion will improve Outcome: Not Met (Patient Deceased) Goal: Vascular access site(s) Level 0-1 will be maintained Outcome: Not Met (Patient Deceased)   Problem: Health Behavior/Discharge Planning: Goal: Ability to safely manage health-related needs after discharge will improve Outcome: Not Met (Patient Deceased)

## 2022-01-02 DEATH — deceased
